# Patient Record
Sex: Female | Born: 1950 | Race: White | Hispanic: No | Marital: Married
Health system: Midwestern US, Community
[De-identification: ages and names within clinical notes are randomized; demographics above are authoritative.]

## PROBLEM LIST (undated history)

## (undated) VITALS — BP 122/60 | HR 60 | Temp 97.30000°F | Resp 16 | Wt 99.0 lb

## (undated) DIAGNOSIS — I4891 Unspecified atrial fibrillation: Secondary | ICD-10-CM

## (undated) DIAGNOSIS — I340 Nonrheumatic mitral (valve) insufficiency: Secondary | ICD-10-CM

## (undated) DIAGNOSIS — R300 Dysuria: Secondary | ICD-10-CM

## (undated) DIAGNOSIS — I428 Other cardiomyopathies: Principal | ICD-10-CM

## (undated) DIAGNOSIS — Z1239 Encounter for other screening for malignant neoplasm of breast: Secondary | ICD-10-CM

## (undated) DIAGNOSIS — I5020 Unspecified systolic (congestive) heart failure: Principal | ICD-10-CM

## (undated) DIAGNOSIS — R350 Frequency of micturition: Secondary | ICD-10-CM

## (undated) DIAGNOSIS — Z1231 Encounter for screening mammogram for malignant neoplasm of breast: Secondary | ICD-10-CM

## (undated) DIAGNOSIS — R922 Inconclusive mammogram: Secondary | ICD-10-CM

## (undated) DIAGNOSIS — N3281 Overactive bladder: Secondary | ICD-10-CM

## (undated) DIAGNOSIS — I5022 Chronic systolic (congestive) heart failure: Principal | ICD-10-CM

## (undated) DIAGNOSIS — Z139 Encounter for screening, unspecified: Secondary | ICD-10-CM

## (undated) DIAGNOSIS — R9230 Dense breasts, unspecified: Secondary | ICD-10-CM

## (undated) DIAGNOSIS — K922 Gastrointestinal hemorrhage, unspecified: Secondary | ICD-10-CM

## (undated) DIAGNOSIS — M150 Primary generalized (osteo)arthritis: Secondary | ICD-10-CM

## (undated) DIAGNOSIS — I480 Paroxysmal atrial fibrillation: Principal | ICD-10-CM

---

## 2014-02-13 ENCOUNTER — Encounter

## 2014-02-13 ENCOUNTER — Inpatient Hospital Stay: Admit: 2014-02-13 | Attending: Family Medicine | Primary: Family Medicine

## 2014-02-13 DIAGNOSIS — Z139 Encounter for screening, unspecified: Secondary | ICD-10-CM

## 2014-02-18 ENCOUNTER — Ambulatory Visit: Admit: 2014-02-18 | Primary: Family Medicine

## 2014-02-18 ENCOUNTER — Encounter

## 2014-02-18 ENCOUNTER — Inpatient Hospital Stay: Attending: Family Medicine | Primary: Family Medicine

## 2014-02-18 DIAGNOSIS — Z1231 Encounter for screening mammogram for malignant neoplasm of breast: Secondary | ICD-10-CM

## 2015-04-09 ENCOUNTER — Emergency Department: Admit: 2015-04-09 | Primary: Family Medicine

## 2015-04-09 ENCOUNTER — Inpatient Hospital Stay
Admission: EM | Admit: 2015-04-09 | Discharge: 2015-04-12 | Disposition: A | Discharge status: Home / Self Care | Admitting: Emergency Medicine

## 2015-04-09 DIAGNOSIS — I495 Sick sinus syndrome: Secondary | ICD-10-CM

## 2015-04-09 LAB — MAGNESIUM: Magnesium: 1.8 mg/dl (ref 1.6–2.4)

## 2015-04-09 LAB — T4, FREE: T4 Free: 0.89 ng/dl — ABNORMAL LOW (ref 0.93–1.76)

## 2015-04-09 LAB — BRAIN NATRIURETIC PEPTIDE: Pro-BNP: 3080 pg/ml — ABNORMAL HIGH (ref 0.0–900.0)

## 2015-04-09 LAB — TSH WITH REFLEX: TSH: 8.78 mcIU/ml — ABNORMAL HIGH (ref 0.400–4.20)

## 2015-04-09 MED ORDER — DILTIAZEM HCL 125 MG/25ML IV SOLN
125 MG/25ML | INTRAVENOUS | Status: DC
Start: 2015-04-09 — End: 2015-04-09

## 2015-04-09 MED ORDER — DILTIAZEM HCL 25 MG/5ML IV SOLN
25 MG/5ML | Freq: Once | INTRAVENOUS | Status: DC
Start: 2015-04-09 — End: 2015-04-09

## 2015-04-09 NOTE — H&P (Signed)
Patient seen and examined   Will adjust meds

## 2015-04-09 NOTE — ED Notes (Addendum)
Pt's heart monitor noted to show bradycardia. Pt awake in bed, patches verified. Pt denies symptoms. Dr. Aura Camps notified.      Danae Orleans, RN  04/09/15 1625       Danae Orleans, RN  04/09/15 [phone]

## 2015-04-09 NOTE — ED Notes (Addendum)
Pt ambulated back to bed with one assist. Pt tolerated well, reconnected to monitor. Call light in reach. Pt requesting something to drink, will check with physician. Family at bedside.      Danae Orleans, RN  04/09/15 1506       Danae Orleans, RN  04/09/15 [phone]

## 2015-04-09 NOTE — ED Notes (Signed)
Called Floor, Transporting pt via TELE Monitor to [address]  . [address]     [place], [place]  38/18/40 3754

## 2015-04-09 NOTE — Other (Signed)
Patient Acct Nbr:  [id number]  Primary AUTH/CERT:    Primary Insurance Company Name:   [employer] P[employer] H[employer] P  Primary Insurance Plan Name:  OUTPATIENT  Primary Insurance Group Number:  [id number]  Primary Insurance Plan Type:   Primary Insurance Policy Number:  [id number]    Secondary AUTH/CERT:    Secondary Insurance Company Name:   [employer] [facility] HMO P  Secondary Insurance Plan Name:  OUTPATIENT PRO  Secondary Insurance Group Number:  [id number]  Secondary Insurance Plan Type:   Secondary Insurance Policy Number:  [id number]

## 2015-04-09 NOTE — H&P (Signed)
[id number] h/p dictated

## 2015-04-09 NOTE — ED Notes (Signed)
Pt assisted up to bathroom, ambulated with one assist.      Danae Orleans, RN  04/09/15 1504

## 2015-04-09 NOTE — ED Provider Notes (Signed)
[facility]  eMERGENCY dEPARTMENT eNCOUnter          CHIEF COMPLAINT       Chief Complaint   Patient presents with   ??? Bradycardia   ??? Shortness of Breath       Nurses Notes reviewed and I agree except as noted in the HPI.    HISTORY OF PRESENT ILLNESS    Michelle Bright is a 65 y.o. female who presents to the Emergency Department from [facility] with concern of bradycardia. Patient woke up this morning with a severe frontal headache, dizziness, and shortness of breath. Patient felt very shaky as well. Patient went to [facility] for evaluation. Patient's heart rate was in the 30 to 40 range. Patient has a history of a-fib. She is on Coumadin. She also on metoprolol 25 mg TID. Labs from [facility] show a hemoglobin of 13.8, normal troponin, INR of 2.5, and a GFR of 59. Upon arrival here at the ED, patient denies chest pain, shortness of breath, headache, or dizziness.     The HPI was provided by the patient.        REVIEW OF SYSTEMS     Review of Systems   Constitutional: Negative for appetite change, chills, fatigue and fever.   HENT: Negative for congestion, ear pain, rhinorrhea and sore throat.    Eyes: Negative for pain, discharge and visual disturbance.   Respiratory: Positive for shortness of breath. Negative for cough and wheezing.    Cardiovascular: Negative for chest pain, palpitations and leg swelling.        See HPI for details.    Gastrointestinal: Negative for abdominal pain, diarrhea, nausea and vomiting.   Genitourinary: Negative for difficulty urinating, dysuria and vaginal discharge.   Musculoskeletal: Negative for arthralgias, back pain, joint swelling and neck pain.   Skin: Negative for pallor and rash.   Neurological: Positive for headaches. Negative for dizziness, syncope, weakness and light-headedness.   Hematological: Negative for adenopathy.   Psychiatric/Behavioral: Negative for confusion, dysphoric mood and suicidal ideas. The patient is not nervous/anxious.         PAST MEDICAL HISTORY    has a past medical history of Atrial fibrillation (HCC); Hypertension; Osteoporosis; and Thyroid disease.    SURGICAL HISTORY      has a past surgical history that includes Femur Surgery (12/2014); Thyroid surgery (2005); and fracture surgery.    CURRENT MEDICATIONS       Previous Medications    ASPIRIN 81 MG TABLET    Take 81 mg by mouth daily    CALCIUM CARB-CHOLECALCIFEROL (CALCIUM + D3 PO)    Take 1,200 mg by mouth 2 times daily    FLECAINIDE (TAMBOCOR) 100 MG TABLET    Take 50 mg by mouth 2 times daily    FUROSEMIDE (LASIX) 40 MG TABLET    Take 40 mg by mouth daily    IRON PO    Take 65 mg by mouth 2 times daily    LEVOTHYROXINE (SYNTHROID) 75 MCG TABLET    Take 75 mcg by mouth Daily    LISINOPRIL-HYDROCHLOROTHIAZIDE (PRINZIDE;ZESTORETIC) 20-12.5 MG PER TABLET    Take 1 tablet by mouth daily    METOPROLOL TARTRATE (LOPRESSOR) 50 MG TABLET    Take 25 mg by mouth 3 times daily    PRAVASTATIN (PRAVACHOL) 40 MG TABLET    Take 40 mg by mouth daily    WARFARIN (COUMADIN) 2 MG TABLET    Take 2 mg by  mouth daily Takes 4 mg on Sunday and Wednesday, all other days takes 2 mg.       ALLERGIES     has No Known Allergies.    FAMILY HISTORY     has no family status information on file.  family history is not on file.    SOCIAL HISTORY      reports that she has quit smoking. She does not have any smokeless tobacco history on file.    PHYSICAL EXAM     INITIAL VITALS:  height is 4\' 11"  (1.499 m) and weight is 128 lb (58.1 kg). Her oral temperature is 97.9 ??F (36.6 ??C). Her blood pressure is 130/84 and her pulse is 96. Her respiration is 20 and oxygen saturation is 95%.    Physical Exam   Constitutional: She is oriented to person, place, and time. She appears well-developed and well-nourished.   HENT:   Head: Normocephalic and atraumatic.   Right Ear: External ear normal.   Left Ear: External ear normal.   Eyes: Conjunctivae are normal. Right eye exhibits no discharge. Left eye exhibits no  discharge. No scleral icterus.   Neck: Normal range of motion. Neck supple. No JVD present.   Cardiovascular: Normal rate and normal heart sounds.  A regularly irregular rhythm present.   Pulmonary/Chest: Effort normal. No stridor. No respiratory distress.   Abdominal: Soft. She exhibits no distension.   Musculoskeletal: Normal range of motion. She exhibits no edema.   Neurological: She is alert and oriented to person, place, and time. She exhibits normal muscle tone. GCS eye subscore is 4. GCS verbal subscore is 5. GCS motor subscore is 6.   Skin: Skin is warm and dry. She is not diaphoretic. No erythema.   Psychiatric: She has a normal mood and affect. Her behavior is normal.   Nursing note and vitals reviewed.      DIFFERENTIAL DIAGNOSIS:   CHF, medication side effect, ACS, bronchitis, brady-tachy syndrome, electrolyte imbalance     DIAGNOSTIC RESULTS     EKG: All EKG's are interpreted by the Emergency Department Physician who either signs or Co-signs this chart in the absence of a cardiologist.    Vent. Rate: 75 bpm  PR interval: 326 ms  QRS duration: 124 ms  QTc: 477 ms  P-R-T axes: 90, 0, -12  Sinus rhythm with 1st degree AV block. Anteroseptal infarct, age undetermined. Abnormal ECG. No STEMI    RADIOLOGY: non-plain film images(s) such as CT, Ultrasound and MRI are read by the radiologist.    CT Head W/O contrast   Final Result   1. No evidence of an acute process.   2. Mild chronic small vessel                   **This report has been created using voice recognition software. It may contain minor errors which are inherent in voice recognition technology.**      Final report electronically signed by Dr. Earlyne Iba on 04/09/2015 3:42 PM             LABS:   Labs Reviewed   TSH WITH REFLEX - Abnormal; Notable for the following:        Result Value    TSH 8.780 (*)     All other components within normal limits   BRAIN NATRIURETIC PEPTIDE - Abnormal; Notable for the following:     Pro-BNP 3080.0 (*)     All other  components within normal limits   MAGNESIUM  T4, FREE       EMERGENCY DEPARTMENT COURSE:   Vitals:    Vitals:    04/09/15 1328 04/09/15 1454   BP: (!) 144/84 130/84   Pulse: 87 96   Resp: 18 20   Temp: 97.9 ??F (36.6 ??C)    TempSrc: Oral    SpO2: 99% 95%   Weight: 128 lb (58.1 kg)    Height: 4\' 11"  (1.499 m)      The patient was seen and evaluated. Medical record from outside hospital reviewed.    Patient was bradycardic in the beginning with heart rate around 40/min, but she was asymptomatic.    At time when I checked the patient, patient heart rate went to 120/min about 10-15 minutes with Afib RVR.    While I was about to start rate control, her heart rate spontaneously dropped to 30-40/minute again.    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    Given that patient is on Coumadin, INR 2.5 today, she complained headache, CT head was obtained negative for acute intracranial findings.    BNP 3080. TSH 8.8. Magnesium 1.8.    She had a chest x-ray done at outside hospital showing no acute findings.    Patient certainly needs a cardiology evaluation of her brady-tachycardia syndrome.    Discussed with Dr. Enrigue Catena and admitted to 8B.      CRITICAL CARE:   None     CONSULTS:  Dr. Enrigue Catena    PROCEDURES:  None    FINAL IMPRESSION      1. Brady-tachy syndrome (HCC)    2. Chronic atrial fibrillation (HCC)    3. Elevated brain natriuretic peptide (BNP) level    4. LONG TERM ANTICOAGULENT USE          DISPOSITION/PLAN   Admit    PATIENT REFERRED TO:  No follow-up provider specified.    DISCHARGE MEDICATIONS:  New Prescriptions    No medications on file       (Please note that portions of this note were completed with a voice recognition program.  Efforts were made to edit the dictations but occasionally words are mis-transcribed.)    Scribe:  Signed by: Marquette Saa, Scribe, 04/09/15 2:23 PM Scribing for and in the presence of Burna Cash, MD    Provider:  I personally performed the services described  in the documentation, reviewed and edited the documentation which was dictated to the scribe in my presence, and it accurately records my words and actions.    Burna Cash, MD 04/09/15 3:55 PM                     Burna Cash, MD  04/09/15 1556

## 2015-04-09 NOTE — ED Notes (Signed)
Pt requesting to sit up in a chair. Chair provided, pt states feels more comfortable. Pt and family informed of new room number for admission. Waiting on new room to be cleaned. Pt and family verbalized understanding.     Danae Orleans, RN  04/09/15 [phone]

## 2015-04-09 NOTE — ED Notes (Signed)
Pt presents via EMS from [facility] for bradycardia in the 40's. EMS reports prior to arrival pt converted to afib in the 90's. Pt denies symptoms. History of afib, pt on Coumadin.     Pt presents awake and alert. Denies pain. Reports she became short of breath around 0400-0600 today and went to be evaluate at [facility] to find out her heart rate was low. Pt reports shortness of breath is improved at this time. Family at bedside. Pt on 2L NC. Does not wear oxygen at home.      Danae Orleans, RN  04/09/15 [phone]

## 2015-04-10 LAB — COMPREHENSIVE METABOLIC PANEL
BUN: 46 mg/dl — ABNORMAL HIGH (ref 7–22)
CO2: 34 meq/l — ABNORMAL HIGH (ref 23–33)
Calcium: 9.5 mg/dl (ref 8.5–10.5)
Chloride: 94 meq/l — ABNORMAL LOW (ref 98–111)
Creatinine: 1.3 mg/dl — ABNORMAL HIGH (ref 0.4–1.2)
Glucose: 124 mg/dl — ABNORMAL HIGH (ref 70–108)
Potassium: 5 meq/l (ref 3.5–5.2)
Sodium: 143 meq/l (ref 135–145)

## 2015-04-10 LAB — PROTIME-INR: INR: 2.17 — ABNORMAL HIGH (ref 0.85–1.13)

## 2015-04-10 LAB — CBC
Hematocrit: 39.7 % (ref 37.0–47.0)
Hemoglobin: 13 gm/dl (ref 12.0–16.0)
MCH: 30.3 pg (ref 27.0–31.0)
MCHC: 32.6 gm/dl — ABNORMAL LOW (ref 33.0–37.0)
MCV: 92.9 fL (ref 81.0–99.0)
MPV: 8.8 mcm (ref 7.4–10.4)
Platelets: 192 10*3/uL (ref 130–400)
RBC: 4.28 10*6/uL (ref 4.20–5.40)
RDW: 16 % — ABNORMAL HIGH (ref 11.5–14.5)
WBC: 10 10*3/uL (ref 4.8–10.8)

## 2015-04-10 LAB — HEPATIC FUNCTION PANEL
ALT: 15 U/L (ref 11–66)
AST: 19 U/L (ref 5–40)
Albumin: 4.3 gm/dl (ref 3.5–5.1)
Alkaline Phosphatase: 95 U/L (ref 38–126)
Bilirubin, Direct: <0.2 mg/dl (ref 0.0–0.3)
Total Bilirubin: 0.5 mg/dl (ref 0.3–1.2)
Total Protein: 8 gm/dl (ref 6.1–8.0)

## 2015-04-10 LAB — ANION GAP: Anion Gap: 15 meq/l (ref 8.0–16.0)

## 2015-04-10 LAB — GLOMERULAR FILTRATION RATE, ESTIMATED: Est, Glom Filt Rate: 41 mL/min/{1.73_m2} — AB

## 2015-04-10 MED ORDER — ONDANSETRON HCL 4 MG/2ML IJ SOLN
4 MG/2ML | Freq: Four times a day (QID) | INTRAMUSCULAR | Status: DC | PRN
Start: 2015-04-10 — End: 2015-04-11

## 2015-04-10 MED ORDER — CALCIUM + D3 600-200 MG-UNIT PO TABS
600-200 MG-UNIT | Freq: Two times a day (BID) | ORAL | Status: DC
Start: 2015-04-10 — End: 2015-04-11
  Administered 2015-04-11 (×2): 1 via ORAL

## 2015-04-10 MED ORDER — FERROUS SULFATE 325 (65 FE) MG PO TABS
325 (65 Fe) MG | Freq: Two times a day (BID) | ORAL | Status: DC
Start: 2015-04-10 — End: 2015-04-10

## 2015-04-10 MED ORDER — ASPIRIN 81 MG PO CHEW
81 MG | Freq: Every day | ORAL | Status: DC
Start: 2015-04-10 — End: 2015-04-10

## 2015-04-10 MED ORDER — ACETAMINOPHEN 325 MG PO TABS
325 MG | ORAL | Status: DC | PRN
Start: 2015-04-10 — End: 2015-04-11
  Administered 2015-04-11: 01:00:00 650 mg via ORAL

## 2015-04-10 MED ORDER — CALCIUM + D3 600-200 MG-UNIT PO TABS
600-200 MG-UNIT | Freq: Two times a day (BID) | ORAL | Status: DC
Start: 2015-04-10 — End: 2015-04-10

## 2015-04-10 MED ORDER — METOPROLOL TARTRATE 25 MG PO TABS
25 MG | Freq: Three times a day (TID) | ORAL | Status: DC
Start: 2015-04-10 — End: 2015-04-10

## 2015-04-10 MED ORDER — FUROSEMIDE 40 MG PO TABS
40 MG | Freq: Every day | ORAL | Status: DC
Start: 2015-04-10 — End: 2015-04-10

## 2015-04-10 MED ORDER — PRAVASTATIN SODIUM 40 MG PO TABS
40 MG | Freq: Every evening | ORAL | Status: DC
Start: 2015-04-10 — End: 2015-04-10

## 2015-04-10 MED ORDER — FLECAINIDE ACETATE 50 MG PO TABS
50 MG | Freq: Two times a day (BID) | ORAL | Status: DC
Start: 2015-04-10 — End: 2015-04-11
  Administered 2015-04-11: 13:00:00 50 mg via ORAL

## 2015-04-10 MED ORDER — NORMAL SALINE FLUSH 0.9 % IV SOLN
0.9 % | INTRAVENOUS | Status: DC | PRN
Start: 2015-04-10 — End: 2015-04-11

## 2015-04-10 MED ORDER — NITROGLYCERIN 0.4 MG SL SUBL
0.4 MG | SUBLINGUAL | Status: DC | PRN
Start: 2015-04-10 — End: 2015-04-11

## 2015-04-10 MED ORDER — LEVOTHYROXINE SODIUM 75 MCG PO TABS
75 MCG | Freq: Every day | ORAL | Status: DC
Start: 2015-04-10 — End: 2015-04-10

## 2015-04-10 MED ORDER — ASPIRIN 81 MG PO TBEC
81 MG | Freq: Every day | ORAL | Status: DC
Start: 2015-04-10 — End: 2015-04-11
  Administered 2015-04-11: 13:00:00 81 mg via ORAL

## 2015-04-10 MED ORDER — LISINOPRIL-HYDROCHLOROTHIAZIDE 20-12.5 MG PO TABS
Freq: Every day | ORAL | Status: DC
Start: 2015-04-10 — End: 2015-04-11
  Administered 2015-04-10 – 2015-04-11 (×2): 1 via ORAL

## 2015-04-10 MED ORDER — FUROSEMIDE 40 MG PO TABS
40 MG | Freq: Every day | ORAL | Status: DC
Start: 2015-04-10 — End: 2015-04-11
  Administered 2015-04-11: 13:00:00 40 mg via ORAL

## 2015-04-10 MED ORDER — HYDROCHLOROTHIAZIDE 25 MG PO TABS
25 MG | Freq: Every day | ORAL | Status: DC
Start: 2015-04-10 — End: 2015-04-10

## 2015-04-10 MED ORDER — PRAVASTATIN SODIUM 40 MG PO TABS
40 MG | Freq: Every evening | ORAL | Status: DC
Start: 2015-04-10 — End: 2015-04-11
  Administered 2015-04-11: 40 mg via ORAL

## 2015-04-10 MED ORDER — POTASSIUM CHLORIDE 10 MEQ/100ML IV SOLN
10 MEQ/0ML | INTRAVENOUS | Status: DC | PRN
Start: 2015-04-10 — End: 2015-04-11

## 2015-04-10 MED ORDER — FLECAINIDE ACETATE 50 MG PO TABS
50 MG | Freq: Two times a day (BID) | ORAL | Status: DC
Start: 2015-04-10 — End: 2015-04-10

## 2015-04-10 MED ORDER — POTASSIUM CHLORIDE 20 MEQ/15ML (10%) PO SOLN
20 MEQ/15ML (10%) | ORAL | Status: DC | PRN
Start: 2015-04-10 — End: 2015-04-11

## 2015-04-10 MED ORDER — NORMAL SALINE FLUSH 0.9 % IV SOLN
0.9 % | Freq: Two times a day (BID) | INTRAVENOUS | Status: DC
Start: 2015-04-10 — End: 2015-04-11

## 2015-04-10 MED ORDER — POTASSIUM CHLORIDE CRYS ER 20 MEQ PO TBCR
20 MEQ | ORAL | Status: DC | PRN
Start: 2015-04-10 — End: 2015-04-11

## 2015-04-10 MED ORDER — LISINOPRIL 20 MG PO TABS
20 MG | Freq: Every day | ORAL | Status: DC
Start: 2015-04-10 — End: 2015-04-10

## 2015-04-10 MED ORDER — WARFARIN SODIUM 2 MG PO TABS
2 MG | Freq: Every day | ORAL | Status: DC
Start: 2015-04-10 — End: 2015-04-11
  Administered 2015-04-11: 13:00:00 2 mg via ORAL

## 2015-04-10 MED ORDER — ASPIRIN 81 MG PO TBEC
81 MG | Freq: Every day | ORAL | Status: DC
Start: 2015-04-10 — End: 2015-04-10

## 2015-04-10 MED ORDER — WARFARIN DAILY DOSING BY PHARMACIST (PLACEHOLDER)
Status: DC
Start: 2015-04-10 — End: 2015-04-11

## 2015-04-10 MED ORDER — CALCIUM CARBONATE-VITAMIN D 500-200 MG-UNIT PO TABS
500-200 MG-UNIT | Freq: Two times a day (BID) | ORAL | Status: DC
Start: 2015-04-10 — End: 2015-04-10

## 2015-04-10 MED ORDER — MAGNESIUM HYDROXIDE 400 MG/5ML PO SUSP
400 MG/5ML | Freq: Every day | ORAL | Status: DC | PRN
Start: 2015-04-10 — End: 2015-04-11

## 2015-04-10 MED ORDER — FERROUS SULFATE 325 (65 FE) MG PO TABS
325 (65 Fe) MG | Freq: Two times a day (BID) | ORAL | Status: DC
Start: 2015-04-10 — End: 2015-04-11
  Administered 2015-04-10 – 2015-04-11 (×2): 325 mg via ORAL

## 2015-04-10 MED ORDER — LISINOPRIL-HYDROCHLOROTHIAZIDE 20-12.5 MG PO TABS
Freq: Every day | ORAL | Status: DC
Start: 2015-04-10 — End: 2015-04-10

## 2015-04-10 MED ORDER — SODIUM CHLORIDE 0.9 % IV SOLN
0.9 % | INTRAVENOUS | Status: DC
Start: 2015-04-10 — End: 2015-04-11
  Administered 2015-04-10: 20:00:00 via INTRAVENOUS

## 2015-04-10 MED FILL — FERROUS SULFATE 325 (65 FE) MG PO TABS: 325 (65 Fe) MG | ORAL | Qty: 1

## 2015-04-10 MED FILL — HYDROCHLOROTHIAZIDE 25 MG PO TABS: 25 MG | ORAL | Qty: 1

## 2015-04-10 MED FILL — PRAVASTATIN SODIUM 40 MG PO TABS: 40 MG | ORAL | Qty: 1

## 2015-04-10 MED FILL — ASPIRIN EC LOW DOSE 81 MG PO TBEC: 81 MG | ORAL | Qty: 1

## 2015-04-10 MED FILL — LEVOTHROID 75 MCG PO TABS: 75 MCG | ORAL | Qty: 1

## 2015-04-10 MED FILL — FLECAINIDE ACETATE 50 MG PO TABS: 50 MG | ORAL | Qty: 1

## 2015-04-10 MED FILL — FUROSEMIDE 40 MG PO TABS: 40 MG | ORAL | Qty: 1

## 2015-04-10 MED FILL — OYSTER SHELL CALCIUM/D 500-200 MG-UNIT PO TABS: 500-200 MG-UNIT | ORAL | Qty: 1

## 2015-04-10 MED FILL — METOPROLOL TARTRATE 25 MG PO TABS: 25 MG | ORAL | Qty: 1

## 2015-04-10 MED FILL — LISINOPRIL 20 MG PO TABS: 20 MG | ORAL | Qty: 1

## 2015-04-10 NOTE — Progress Notes (Signed)
Admit Date: 04/09/2015  Hospital day 1    Subjective:     Patient has no complaint of chest pain .Marland Kitchen   Medication side effects: none    Scheduled Meds:  ??? warfarin  2 mg Oral Daily   ??? sodium chloride flush  10 mL Intravenous 2 times per day   ??? warfarin (COUMADIN) daily dosing Leisure centre manager)   Other RX Placeholder   ??? lisinopril-hydrochlorothiazide  1 tablet Oral Daily   ??? [START ON 04/11/2015] aspirin  81 mg Oral Daily   ??? metoprolol tartrate  25 mg Oral TID   ??? [START ON 04/11/2015] furosemide  40 mg Oral Daily   ??? [START ON 04/11/2015] flecainide  50 mg Oral BID   ??? pravastatin  40 mg Oral Nightly   ??? Calcium + D3  1 tablet Oral BID   ??? ferrous sulfate  325 mg Oral BID WC   ??? [START ON 04/11/2015] levothyroxine  100 mcg Oral Daily     Continuous Infusions:  ??? sodium chloride 20 mL/hr at 04/10/15 1558     PRN Meds:sodium chloride flush, potassium chloride **OR** potassium chloride **OR** potassium chloride, acetaminophen, magnesium hydroxide, ondansetron, nitroGLYCERIN    Review of Systems  Pertinent items are noted in HPI.    Objective:     Patient Vitals for the past 8 hrs:   BP Temp Temp src Pulse Resp SpO2   04/10/15 1957 115/70 98.1 ??F (36.7 ??C) Oral 54 16 94 %   04/10/15 1606 (!) 119/56 98.3 ??F (36.8 ??C) Oral 56 16 95 %     I/O last 3 completed shifts:  In: 400 [P.O.:400]  Out: 400 [Urine:400]    Visit Vitals   ??? BP 111/75   ??? Pulse 70   ??? Temp 98.2 ??F (36.8 ??C) (Oral)   ??? Resp 16   ??? Ht 4\' 11"  (1.499 m)   ??? Wt 124 lb 11.2 oz (56.6 kg)   ??? SpO2 95%   ??? BMI 25.19 kg/m2       General Appearance:    Alert, cooperative, no distress, appears stated age   Head:    Normocephalic, without obvious abnormality, atraumatic   Eyes:    PERRL, conjunctiva/corneas clear, EOM's intact, fundi     benign, both eyes   Ears:    Normal TM's and external ear canals, both ears   Nose:   Nares normal, septum midline, mucosa normal, no drainage    or sinus tenderness   Throat:   Lips, mucosa, and tongue normal; teeth and gums normal    Neck:   Supple, symmetrical, trachea midline, no adenopathy;     thyroid:  no enlargement/tenderness/nodules; no carotid    bruit or JVD   Back:     Symmetric, no curvature, ROM normal, no CVA tenderness   Lungs:     Clear to auscultation bilaterally, respirations unlabored   Chest Wall:    No tenderness or deformity    Heart:    Regular rate and rhythm, S1 and S2 normal, no murmur, rub   or gallop   Breast Exam:    No tenderness, masses, or nipple abnormality   Abdomen:     Soft, non-tender, bowel sounds active all four quadrants,     no masses, no organomegaly   Genitalia:    Normal female without lesion, discharge or tenderness   Rectal:    Normal tone ;guaiac negative stool   Extremities:   Extremities normal, atraumatic, no cyanosis or edema  Pulses:   2+ and symmetric all extremities   Skin:   Skin color, texture, turgor normal, no rashes or lesions   Lymph nodes:   Cervical, supraclavicular, and axillary nodes normal   Neurologic:   CNII-XII intact, normal strength, sensation and reflexes     throughout       ECG: normal sinus rhythm, no blocks or conduction defects, no ischemic changes.   Data Review  CBC:   Lab Results   Component Value Date    WBC 8.7 04/11/2015    RBC 4.24 04/11/2015     BMP:   Lab Results   Component Value Date    GLUCOSE 105 04/11/2015    CO2 29 04/11/2015    BUN 38 04/11/2015    CREATININE 1.0 04/11/2015    CALCIUM 9.4 04/11/2015     Coagulation:   Lab Results   Component Value Date    INR 2.17 04/10/2015     Cardiac markers: No results found for: CKMB, TROPONINT, MYOGLOBIN  ABG: No results found for: HCO3ART, BEART, O2SATART, PHART, THGBART, PCO2ART, PO2ART, TCO2ART    Assessment:     Active Problems:    * No active hospital problems. *      Plan:   Patient in sr    no chest pain    she dose not want any invasive procedure at this point    ambulate stop lopressor

## 2015-04-10 NOTE — Progress Notes (Signed)
Copy of healthcare power of attorney and living will information placed in patient's chart.

## 2015-04-10 NOTE — Progress Notes (Signed)
Pharmacy Medication History Note      List of current medications patient is taking is complete.    Source of information: patient's family    Changes made to medication list:  Medications flagged for removal (include reason, ex. noncompliance):  ?? None    Medications removed (include reason, ex. therapy complete or physician discontinued):  ?? None    Medications added/doses adjusted:  ?? Added Tylenolol 650 mg PO Q6H prn pain      Other notes (ex. Recent course of antibiotics, Coumadin dosing):  ?? Takes Coumadin and follows up with Dr. Geradine Girt, a cardiologist in [place], [place]  ?? Denies use of other OTC or herbal medications.      Allergies reviewed      Electronically signed by Granville Lewis, RPH on 04/10/2015 at 3:22 PM

## 2015-04-10 NOTE — Other (Signed)
2100: Dr. Enrigue Catena was on the floor and made aware that the patient had not received Her Metoprolol 25 mg throughout the day or her Fleconide during day shift because pt was bradycardic. It was also discussed to place patient on a cardiac diet.    2107: Dr. Enrigue Catena wanted to monitor the pt HR on the monitor and instructed the nurse to walk the pt around the unit. THe pt HR remained stable and pt tolerated walking 200 Ft well, no chest pain or shortness of breath. Orders were received to discontinue 25 mg Lopressor TID and monitor throughout the night.     2110: THe nurse notified Dr. Enrigue Catena about pt INR of 2.17 for coumadin dosing.

## 2015-04-10 NOTE — Plan of Care (Signed)
Problem: Falls - Risk of  Goal: Absence of falls  Outcome: Ongoing  Fall precautions in place. Bed locked and in lowest position. Bedside table and call light within reach.     Problem: Cardiovascular  Goal: No DVT, peripheral vascular complications  Outcome: Ongoing  Coumadin for DVT prophylaxis. No signs/symptoms DVT.   Goal: Hemodynamic stability  Outcome: Ongoing  Patient having bradycardia in 40's and 50's. Other vital signs stable. Will continue to assess.   Goal: Anticoagulate/Hct stable  Outcome: Ongoing  Coumadin for anticoagulation. Will continue to assess.     Problem: Respiratory  Goal: No pulmonary complications  Outcome: Ongoing  SpO2 greater than 90% on room air.   Goal: O2 Sat > 90%  Outcome: Ongoing  SpO2 greater than 90% on room air.     Problem: Musculor/Skeletal Functional Status  Goal: Highest potential functional level  Outcome: Ongoing  Assistance needed with ADL's. Will continue to assess.   Goal: Absence of falls  Outcome: Ongoing  Fall precautions in place. Bed locked and in lowest position. Bedside table and call light within reach.     Comments:   Care plan reviewed with patient and family.  Patient and family verbalize understanding of the plan of care and contribute to goal setting.

## 2015-04-10 NOTE — Care Coordination-Inpatient (Signed)
Erlinda Hong       Admitted from: ED 04/09/2015/ 1547 Hospital day: 0   Location: 3B-38/038-A Reason for admit: BRADY TACHY SYNDROME Status: obs  Admit order signed?: no  PMH:  has a past medical history of Arthritis; Atrial fibrillation (HCC); CAD (coronary artery disease); Hypertension; Osteoporosis; and Thyroid disease.  Medications:  Scheduled Meds:  Continuous Infusions:   Pertinent Info/Orders/Treatment Plan: Here for bradycardia, heart rate 40-50s. CT head shows no acute process. Awaiting for physician to round for further orders.   Diet: DIET CARDIAC;   Vital Signs:   Visit Vitals   ??? BP (!) 120/54   ??? Pulse 53   ??? Temp 97.7 ??F (36.5 ??C) (Oral)   ??? Resp 18   ??? Ht 4\' 11"  (1.499 m)   ??? Wt 123 lb 12.8 oz (56.2 kg)   ??? SpO2 97%   ??? BMI 25 kg/m2     DVT Prophylaxis: not on  Influenza Vaccination Screening Completed: yes  Pneumonia Vaccination Screening Completed: yes  Core measures: monitoring  PCP: Padmaja Chalasani  Readmission: no  Risk Score: 3.5     Discharge Planning  Current Residence:  Private Residence  Living Arrangements:  Spouse/Significant Other   Support Systems:  Spouse/Significant Other, Children, Family Members  Current Services PTA:     Potential Assistance Needed:  N/A  Potential Assistance Purchasing Medications:  No  Does patient want to participate in local refill/ meds to beds program?  No  Type of Home Care Services:  None  Patient expects to be discharged to:  home  Expected Discharge date:  04/12/15  Follow Up Appointment: Best Day/ Time: Tuesday PM    Discharge Plan: Pt from home with husband.   Needs help with meds? no  Needs transportation? no  Social Services Evaluation: not at this time.

## 2015-04-10 NOTE — Other (Signed)
During my contact with the patient, I gave them a copy of the Adv Dir. They will complete the documents after reviewed. Plan: Continued support would be helpful.      04/10/15 1108   Encounter Summary   Services provided to: Patient and family together   Referral/Consult From: Rounding   Continue Visiting Yes  (04/10/15 Spiritual support would be helpful. )   Complexity of Encounter Low   Length of Encounter 15 minutes   Routine   Type Initial   Assessment Approachable   Intervention Nurtured hope   Outcome Expressed gratitude   Spiritual/Religious   Type Spiritual support   Advance Directives (For Healthcare)   Healthcare Directive No, patient does not have an advance directive for healthcare treatment   Advance Directives Documents given;Documents explained

## 2015-04-10 NOTE — Plan of Care (Signed)
Problem: Falls - Risk of  Goal: Absence of falls  Outcome: Ongoing  Assessment & interventions provided throughout shift.  Bed locked & in low position, call light in reach, side-rails up x2, non-slip socks on when ambulating, reminded patient to use call light to call for assistance.        Comments:   Care plan reviewed with patient.  Patient verbalizes understanding of the care plan and contributed to goal setting.

## 2015-04-11 LAB — EKG 12-LEAD
Atrial Rate: 59 {beats}/min
Atrial Rate: 75 {beats}/min
P Axis: 90 degrees
P Axis: 93 degrees
P-R Interval: 230 ms
P-R Interval: 326 ms
Q-T Interval: 428 ms
Q-T Interval: 474 ms
QRS Duration: 124 ms
QRS Duration: 98 ms
QTc Calculation (Bazett): 469 ms
QTc Calculation (Bazett): 477 ms
R Axis: 0 degrees
R Axis: 16 degrees
T Axis: -12 degrees
T Axis: 44 degrees
Ventricular Rate: 59 {beats}/min
Ventricular Rate: 75 {beats}/min

## 2015-04-11 LAB — CBC
Hematocrit: 39.7 % (ref 37.0–47.0)
Hemoglobin: 13.4 gm/dl (ref 12.0–16.0)
MCH: 31.5 pg — ABNORMAL HIGH (ref 27.0–31.0)
MCHC: 33.7 gm/dl (ref 33.0–37.0)
MCV: 93.5 fL (ref 81.0–99.0)
MPV: 9.2 mcm (ref 7.4–10.4)
Platelets: 159 10*3/uL (ref 130–400)
RBC: 4.24 10*6/uL (ref 4.20–5.40)
RDW: 15.2 % — ABNORMAL HIGH (ref 11.5–14.5)
WBC: 8.7 10*3/uL (ref 4.8–10.8)

## 2015-04-11 LAB — COMPREHENSIVE METABOLIC PANEL
ALT: 12 U/L (ref 11–66)
AST: 17 U/L (ref 5–40)
Albumin: 3.9 gm/dl (ref 3.5–5.1)
Alkaline Phosphatase: 85 U/L (ref 38–126)
BUN: 38 mg/dl — ABNORMAL HIGH (ref 7–22)
CO2: 29 meq/l (ref 23–33)
Calcium: 9.4 mg/dl (ref 8.5–10.5)
Chloride: 98 meq/l (ref 98–111)
Creatinine: 1 mg/dl (ref 0.4–1.2)
Glucose: 105 mg/dl (ref 70–108)
Potassium: 3.8 meq/l (ref 3.5–5.2)
Sodium: 142 meq/l (ref 135–145)
Total Bilirubin: 0.6 mg/dl (ref 0.3–1.2)
Total Protein: 7.2 gm/dl (ref 6.1–8.0)

## 2015-04-11 LAB — LIPID PANEL
Cholesterol, Total: 156 mg/dl (ref 100–199)
HDL: 64 mg/dl
LDL Calculated: 71 mg/dl
Triglycerides: 107 mg/dl (ref 0–199)

## 2015-04-11 LAB — GLOMERULAR FILTRATION RATE, ESTIMATED: Est, Glom Filt Rate: 55 mL/min/{1.73_m2} — AB

## 2015-04-11 LAB — ANION GAP: Anion Gap: 15 meq/l (ref 8.0–16.0)

## 2015-04-11 MED ORDER — LEVOTHYROXINE SODIUM 100 MCG PO TABS
100 MCG | Freq: Every day | ORAL | Status: DC
Start: 2015-04-11 — End: 2015-04-11
  Administered 2015-04-11: 13:00:00 100 ug via ORAL

## 2015-04-11 MED ORDER — NITROGLYCERIN 0.4 MG SL SUBL
0.4 MG | ORAL_TABLET | SUBLINGUAL | 3 refills | Status: AC
Start: 2015-04-11 — End: ?

## 2015-04-11 MED FILL — ACETAMINOPHEN 325 MG PO TABS: 325 mg | ORAL | Qty: 2

## 2015-04-11 MED FILL — LEVOTHROID 100 MCG PO TABS: 100 MCG | ORAL | Qty: 1

## 2015-04-11 NOTE — Discharge Instructions (Signed)
Good nutrition is important when healing from an illness, injury, or surgery. Follow any nutrition recommendations given to you during the hospital stay. If you are instructed to take an oral nutrition supplement at home, you can take it with meals, in-between meals, and/or before bedtime. These supplements can be purchased at most local grocery stores, pharmacies, and chain super-stores. If you need help in covering the cost of your medication or oral supplement, visit www.RxAssist.org. If you have any questions about your diet or nutrition, call the hospital and ask for the dietitian.       Your nutrition orders at the time of discharge were:  DIET CARDIAC;.

## 2015-04-11 NOTE — Procedures (Signed)
EKG was handed to Sean RN.

## 2015-04-11 NOTE — H&P (Signed)
[facility]                       [address] [place]  [address]                                 HISTORY AND PHYSICAL    PATIENT NAME:  Michelle Bright, Michelle A.                DOB:      [birth year]  MED REC NO:    [id number]                        ROOM:     3B 0038A  ACCOUNT NO:    [id number]                          ADMISSION DATE: 04/09/2015  PHYSICIAN:     Alver Fisher, M.D.                  This is in lieu of the Epic computer note.    REASON FOR ADMISSION:  Bradycardia.    HISTORY OF PRESENT ILLNESS:  This is a 65 year old lady who was transferred  from her local community hospital.  Apparently, patient had a history of  atrial fibrillation.  She was in the Emergency Room with her local community  hospital with palpitations and atrial fibrillation, converted to 120.   Apparently, she had also the Emergency Room bradycardia.  She was admitted  because of that.  She was very anxious in the Emergency Room at [facility] and she was given Xanax.  She felt better.  The family requested to  be transferred here.  The patient then came to the Emergency Room to our  facility.  She was in atrial flutter with 2:1 blockage.  The patient, however,  converted spontaneously to a sinus rhythm.  The patient has no prior history  of MI.  She is telling me that she had a stress test and echo performed by Dr.  Geradine Girt before long and she was told everything was okay.  The patient has no  prior history of MI.  The patient gets dyspnea on exertion.  The patient has  no history of syncopal episodes.    REVIEW OF SYSTEMS:  The patient has no history of CVA or TIA.  She had a  history of hypothyroidism on hormone replacement.  Medications recently  adjusted.  The patient has a history of hypertension.  She had a history of  osteoporosis, history of arthritis.    She has no fevers, chills, or rigors.  She denied claudication.  She had a  history of femur surgery in the past.  The  patient has no change in her  weight.  No psychological disorders.  She has no hematological disorders.  The  patient when admitted, her BUN and creatinine were elevated.    ALLERGIES:  The patient has no known allergy.    SOCIAL HISTORY:  Denies smoking or alcohol abuse.    FAMILY HISTORY:  Irrelevant.  The patient is a former smoker.    PHYSICAL EXAMINATION:  VITAL SIGNS:  Showed her blood pressure is 110/60.  She was in sinus  bradycardia.  She was afebrile.  Respiratory rate was 18.  NEUROLOGIC:  The patient has  no focal neurological deficits.  NECK:  She has no JVD.  She has no carotid bruit.  There was no thyromegaly,  no lymphadenopathy.  CHEST:  There increase at the AP diameter of the chest.    HEART:  Distant heart sounds.  II/VI systolic murmur.  ABDOMEN:  Soft.  GENITAL AND RECTAL EXAM:  Deferred.  EXTREMITIES:  There was no edema.    Her EKG showed atrial flutter.    IMPRESSION:  1.  The patient in atrial flutter.  The patient placed on Cardizem drip.  The  patient converted spontaneously to sinus rhythm.  2.  Possible chronic obstructive pulmonary disease, increase in the AP  diameter of the chest.  3.  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  4.  Anxiety.  5.  Hypothyroidism, on hormone replacement, recently was adjusted.  6.  Hypercholesterolemia, on a statin.  7.  History of thyroid surgery.  8.  History of femur surgery.    The patient admitted to the Telemetry bed.  Obtain serial cardiac enzymes,  serial EKGs.  Continue home medication and further recommendations pending the  results of the above tests.        Alver Fisher, M.D.      D: 04/13/2015 06:34:13  T: 04/13/2015 07:02:52  AS/nts  Job#: [id number]  Doc#: [id number]    cc:  Alver Fisher, M.D.

## 2015-04-11 NOTE — Discharge Summary (Signed)
[facility]                       [address] [place]  [address]                                   DISCHARGE SUMMARY    PATIENT NAME:  Michelle Bright, Michelle A.                DOB:      Apr 06, 1950  MED REC NO:    [id number]                        ROOM:     3B 0038A  ACCOUNT NO:    [id number]                          ADMISSION DATE: 04/09/2015  PHYSICIAN:     Alver Fisher, M.D.               DISCHARGE DATE: 04/11/2015      FINAL DIAGNOSES:  1.  Bradycardia-tachycardia syndrome.  2.  History of hypertension.  3.  History of hypothyroidism, on hormone replacement.  4.  Possible chronic obstructive pulmonary disease.   5.  Ex-smoker.    BRIEF HISTORY:  A 65 year old lady was admitted to the hospital through the  Emergency Room from her local community hospital with palpitation and  shortness of breath.  The patient was given Xanax.  The patient came here for  further evaluation.  Apparently, the patient has a history of atrial fib in  the past.  She has been on the Coumadin.  She has been on flecainide and  Lopressor.  The patient has seen Dr. Geradine Girt in the past, she had noninvasive  cardiac workup by him and she was told everything was okay.  The patient came  in with shortness of breath.    HOSPITALIZATION COURSE:  The patient was admitted to the hospital.  She was  placed on Cardizem drip.  She was in atrial fib with 2:1.  They showed the  patient converted to a sinus rhythm.  The patient was in sinus bradycardia.   Beta blockers were discontinued.  We kept her flecainide on for atrial fib.    She continued to be on for sinus rhythm.  The patient does not want to have  any invasive procedure done.  She indicates she is feeling better and she  wants to go home.  The patient indicates she has seen Dr. Geradine Girt and she will  continue seeing him.  The patient was advised to seek medical attention if she  had any recurrence of her chest pain or dizziness or syncopal episode.  At  one  point she might need to have a pacemaker put in pending her clinical course,  but since she has been in the hospital in the last 24 hours she continued to  be in sinus rhythm with no pauses.  No pericardial symptoms.  The patient  ambulated and she was able to increase her heart rate to an 80 and she was not  short of breath with that.  The patient discharged home to follow up with Dr.  Geradine Girt in a week.  To follow with family physician and seek medical attention  if she has any change in her  clinical condition.    The patient's BNP was slightly elevated when she came in and her TSH was  elevated.  The patient indicates recently her thyroid medication was adjusted.   She is to follow with family physician to monitor his renal function.  The  patient advised about diet and exercise.        Alver Fisher, M.D.      D: 04/13/2015 06:39:31  T: 04/13/2015 07:13:59  AS/nts  Job#: [id number]  Doc#: [id number]

## 2015-04-11 NOTE — Discharge Summary (Signed)
[id number] discharge summary dictated

## 2015-08-24 ENCOUNTER — Encounter

## 2015-08-24 ENCOUNTER — Inpatient Hospital Stay: Admit: 2015-08-24 | Payer: BLUE CROSS/BLUE SHIELD | Primary: Family Medicine

## 2015-08-24 DIAGNOSIS — Z1231 Encounter for screening mammogram for malignant neoplasm of breast: Secondary | ICD-10-CM

## 2015-09-02 IMAGING — MG MAMMO SCRN BIL W/CAD TOMO
8 series · 9 of 24 positions shown · non-contrast
Comparison: none

Images Obtained from [facility]
CLINICAL RA REF: Mammo Scrn bilateral (Digital) W/Cad Routine with Tomosynthesis.
Digital images were generated from the 3D Tomosynthesis data acquired during the exam.
Comparison is made to exam dated:  10/25/2006 [HOSPITAL] - [HOSPITAL].
The tissue of both breasts is predominantly fatty.
Current study was also evaluated with a Computer Aided Detection (CAD) system.
No significant masses, calcifications, or other findings are seen in either breast.
There has been no significant interval change.

[R CC]
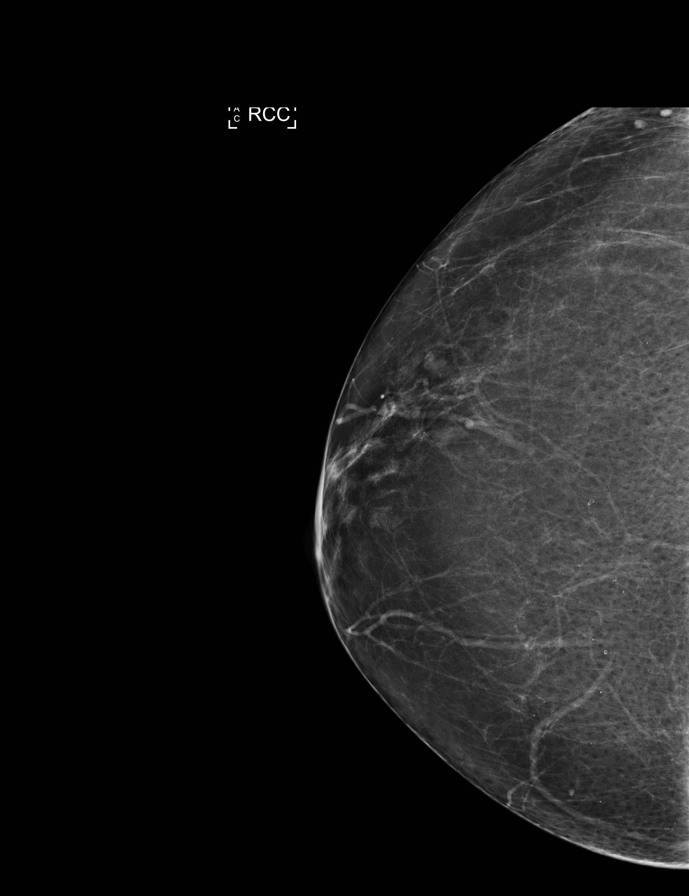

[L CC]
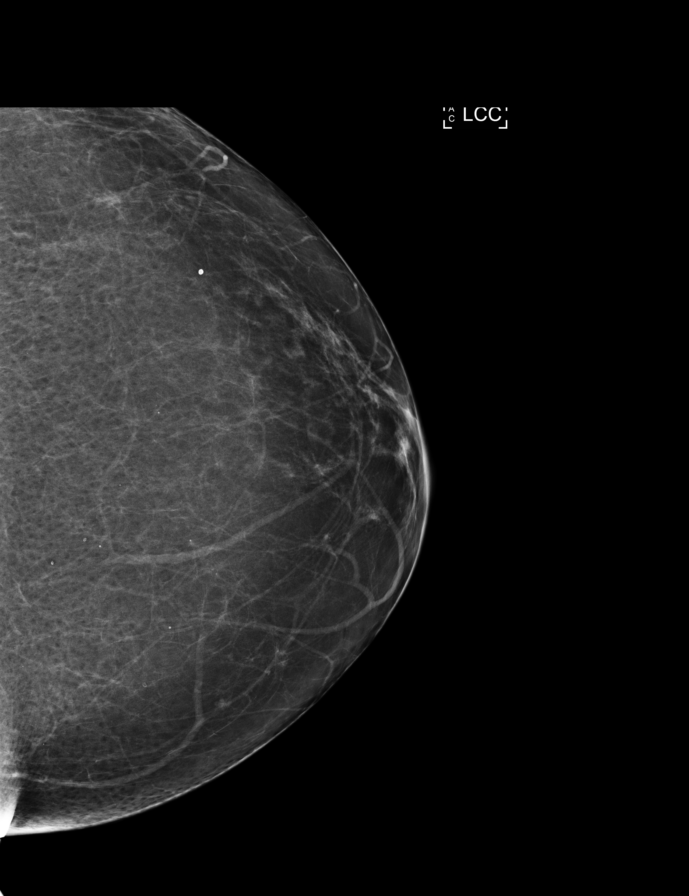

[L MLO]
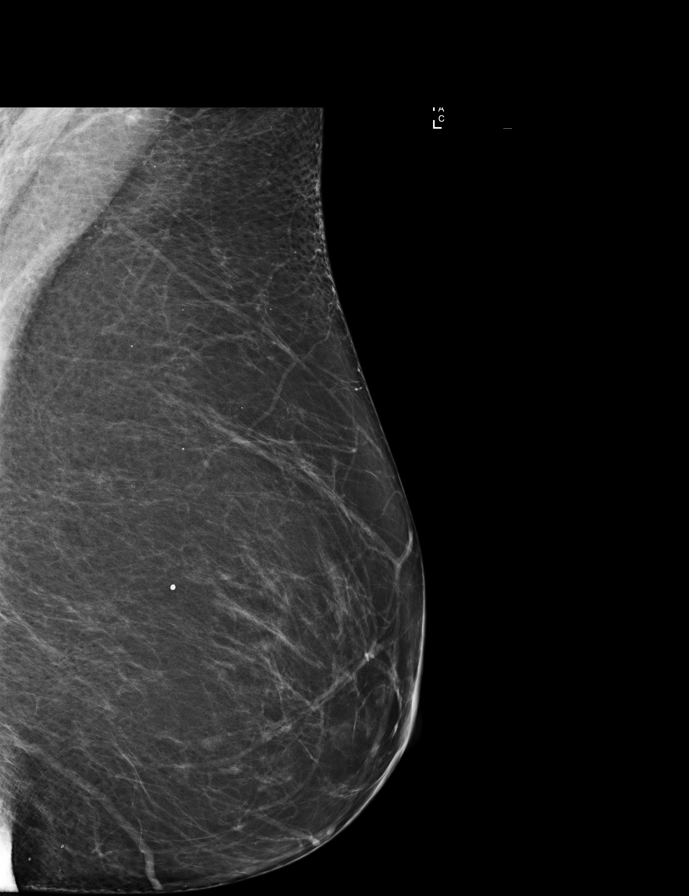

[R MLO]
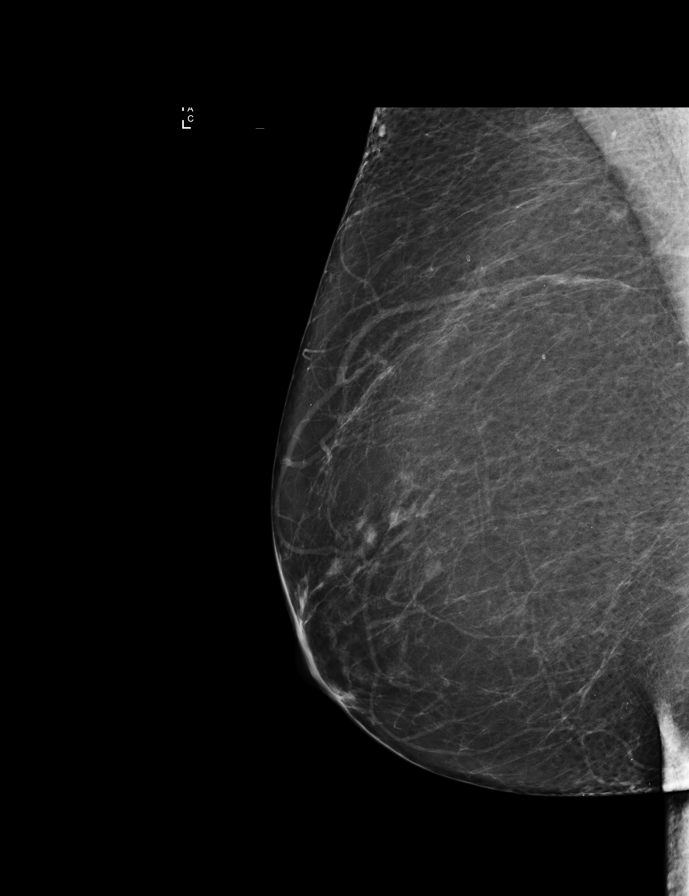

[L MLO tomo · 2 of 91 frames shown]
[frame 30/91]
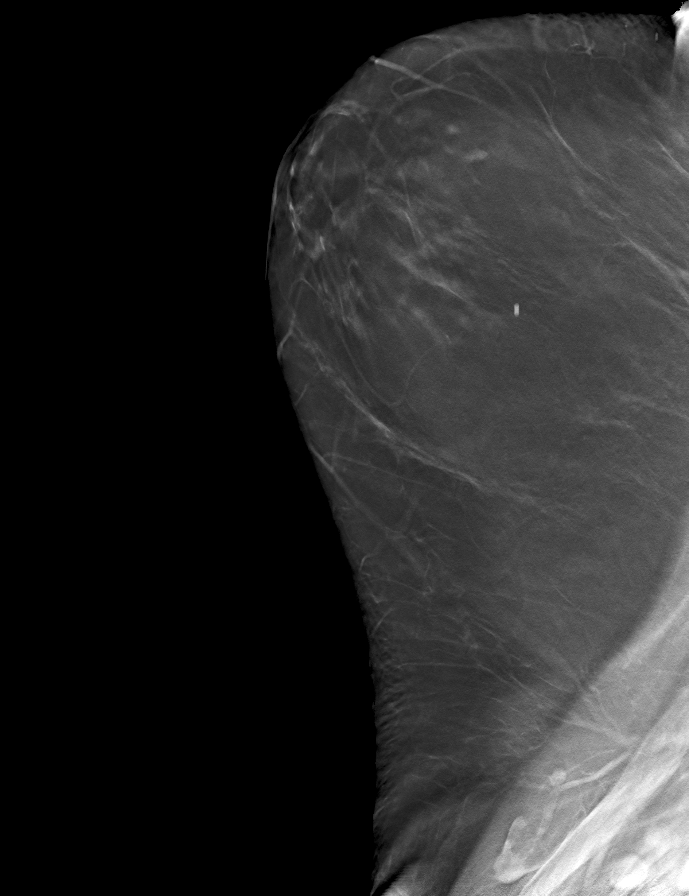
[frame 46/91]
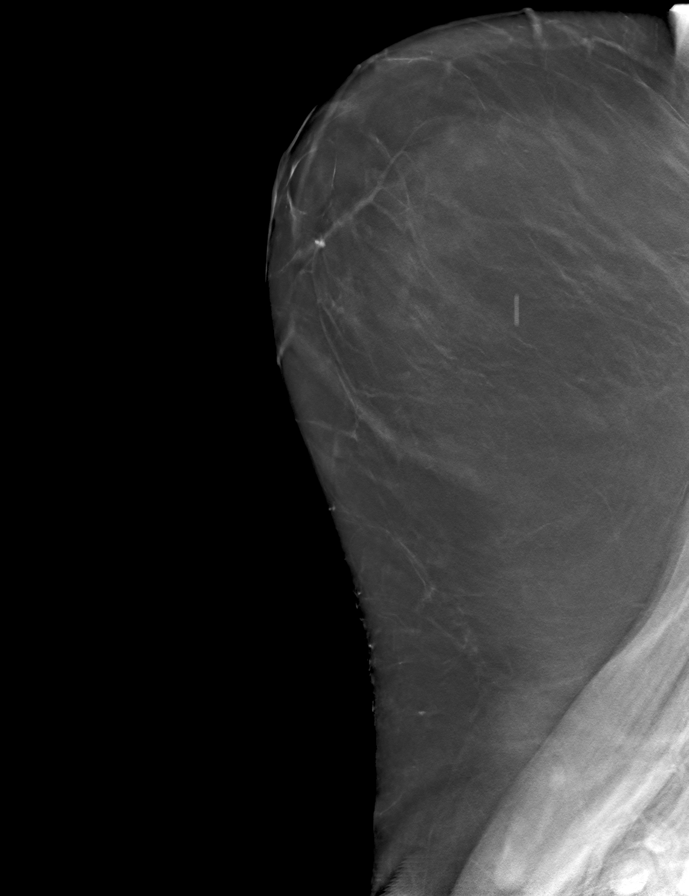

[R MLO tomo · tomo slice 47/93.0]
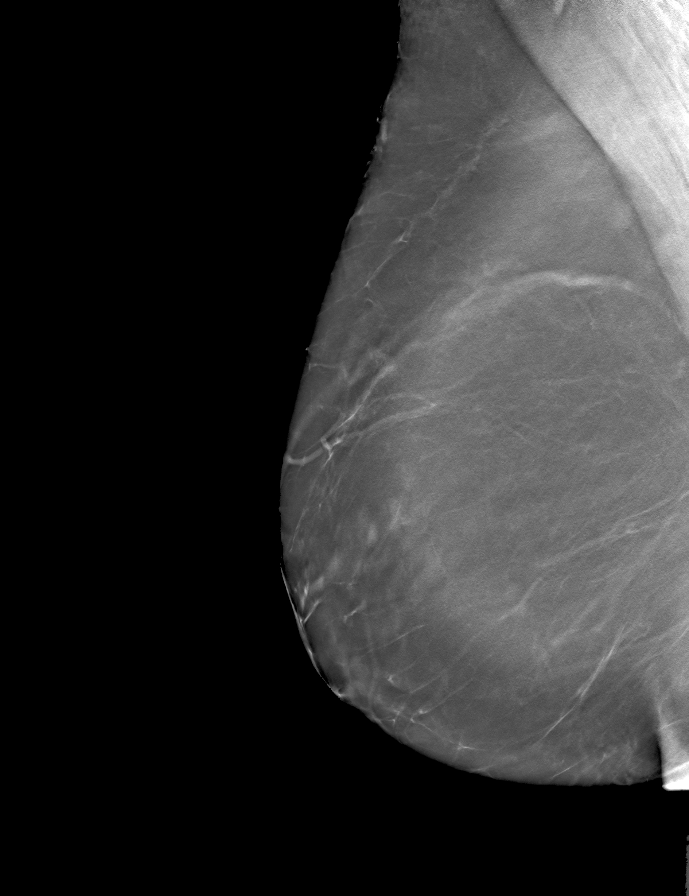

[L CC tomo · tomo slice 43/85.0]
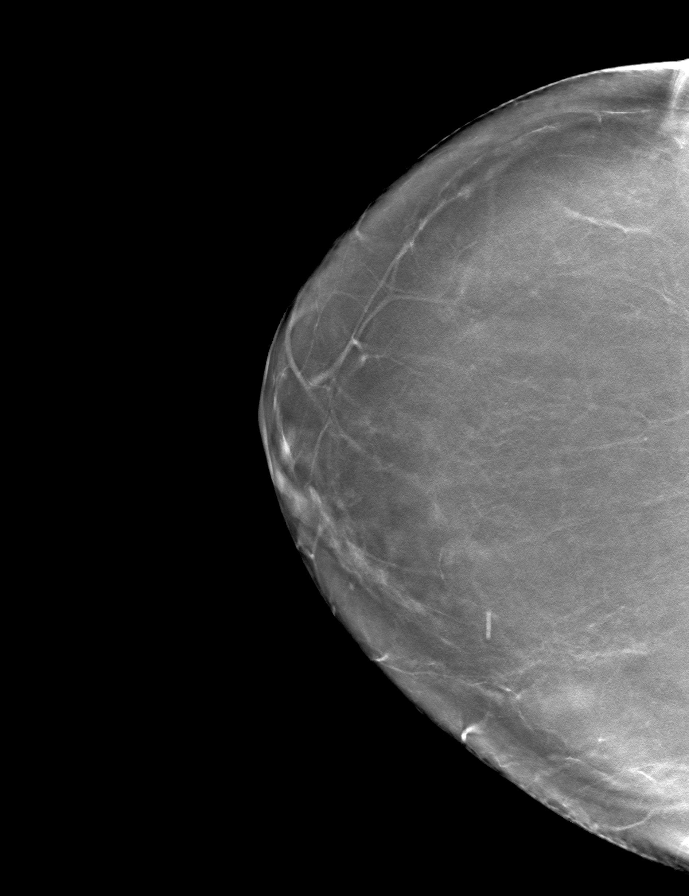

[R CC tomo · tomo slice 42/83.0]
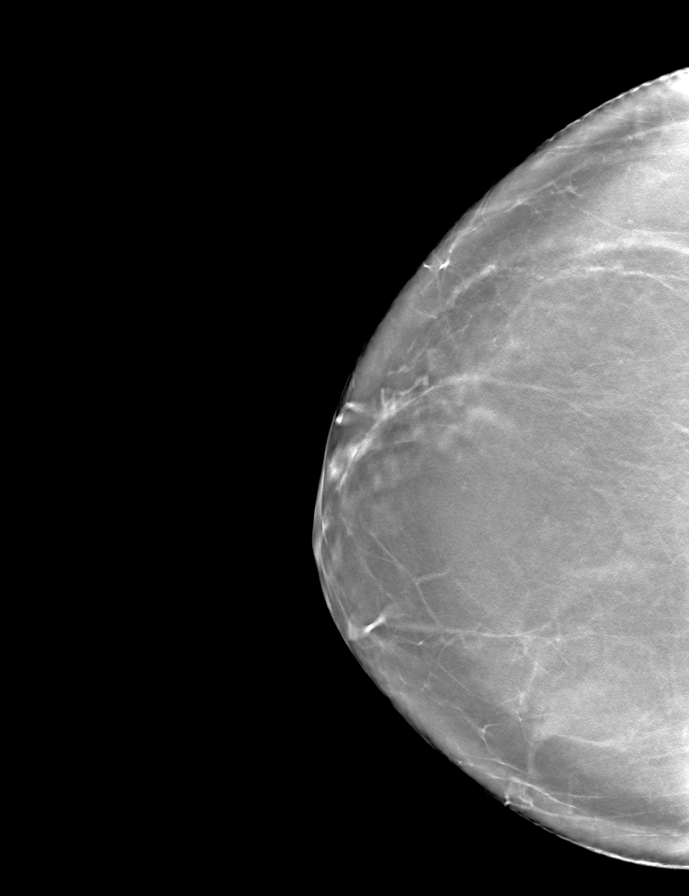

[9 of 24 positions shown; findings below may reference images not displayed]

IMPRESSION: There is no mammographic evidence of malignancy. A 1 year screening mammogram is recommended.
mc/penrad:09/04/2015 [DATE]
letter sent: Normal
Mammogram BI-RADS: 1 Negative

## 2015-09-02 IMAGING — OT DXA BONE DENSITY
2 series · 2 of 2 positions shown · non-contrast
Comparison: none

REASON FOR EXAM: Postmenopausal screening

RISK FACTORS:  None.
PRIOR EXAMS:  DXA from [HOSPITAL] dated 10/25/2006.
METHOD:  Scans of the spine and hip were performed using dual energy X-ray densitometry (DXA) with the Hologic Discovery SL ([id number])  system.

[Series 1: — · 1 of 1 slices shown (1 of 2)]
[im 1/1]
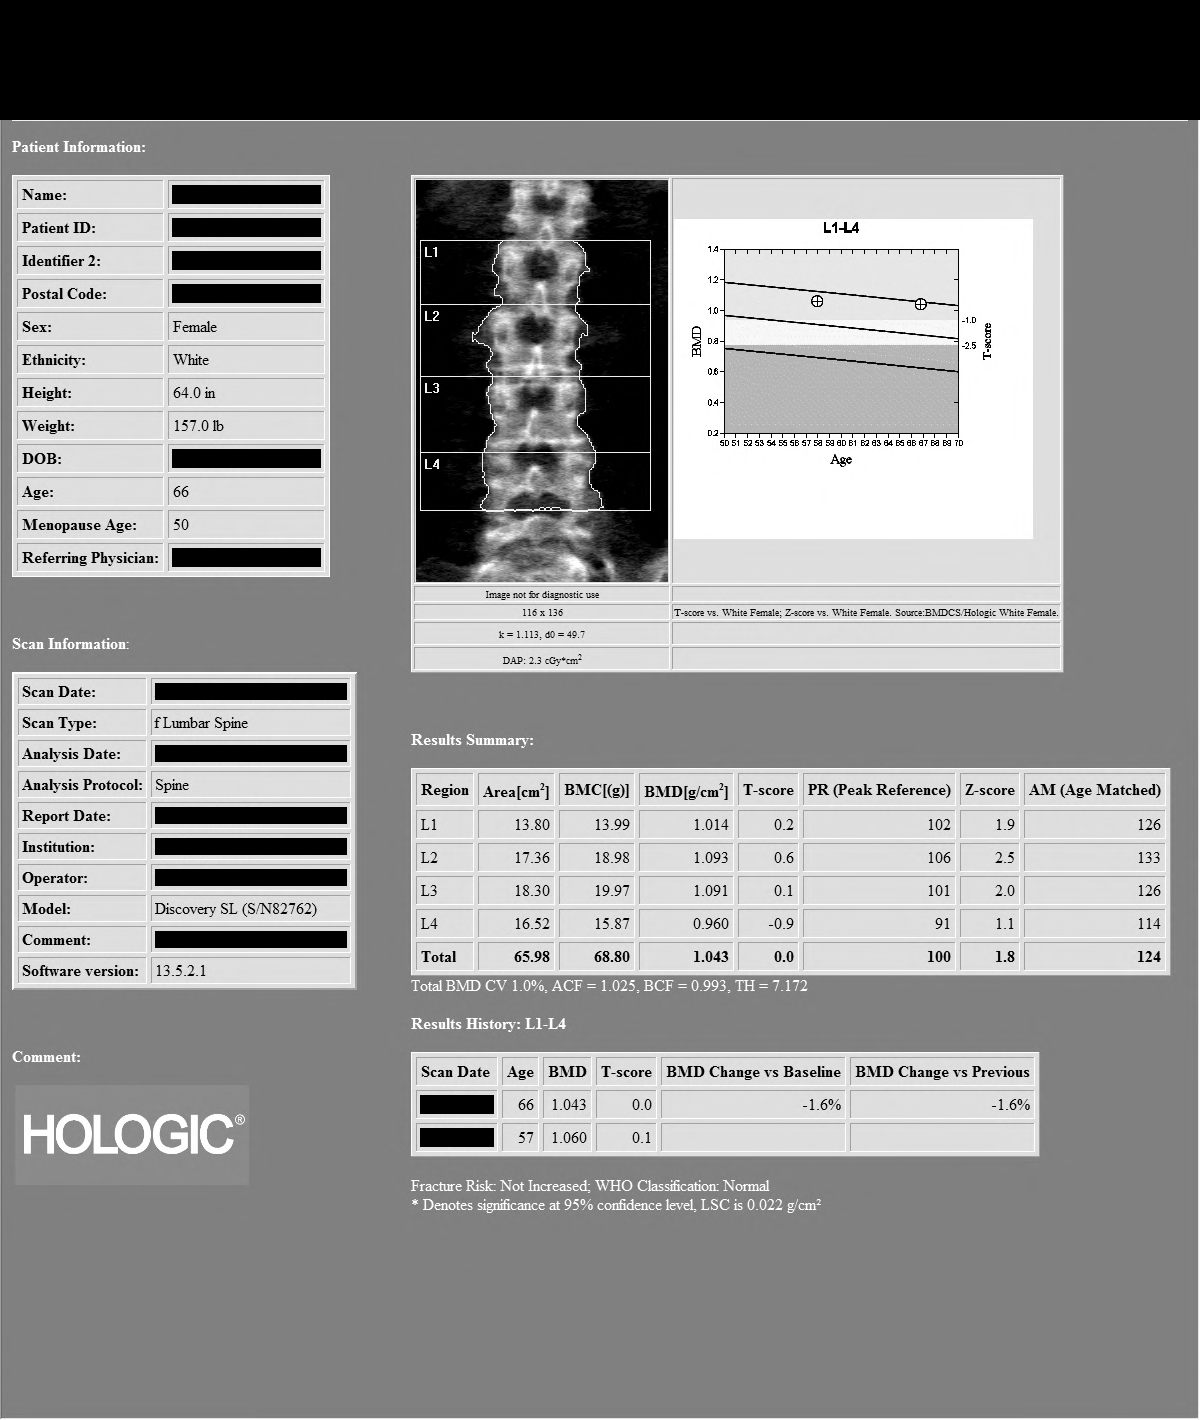

[Series 2: — · left · 1 of 1 slices shown (2 of 2)]
[im 1/1]
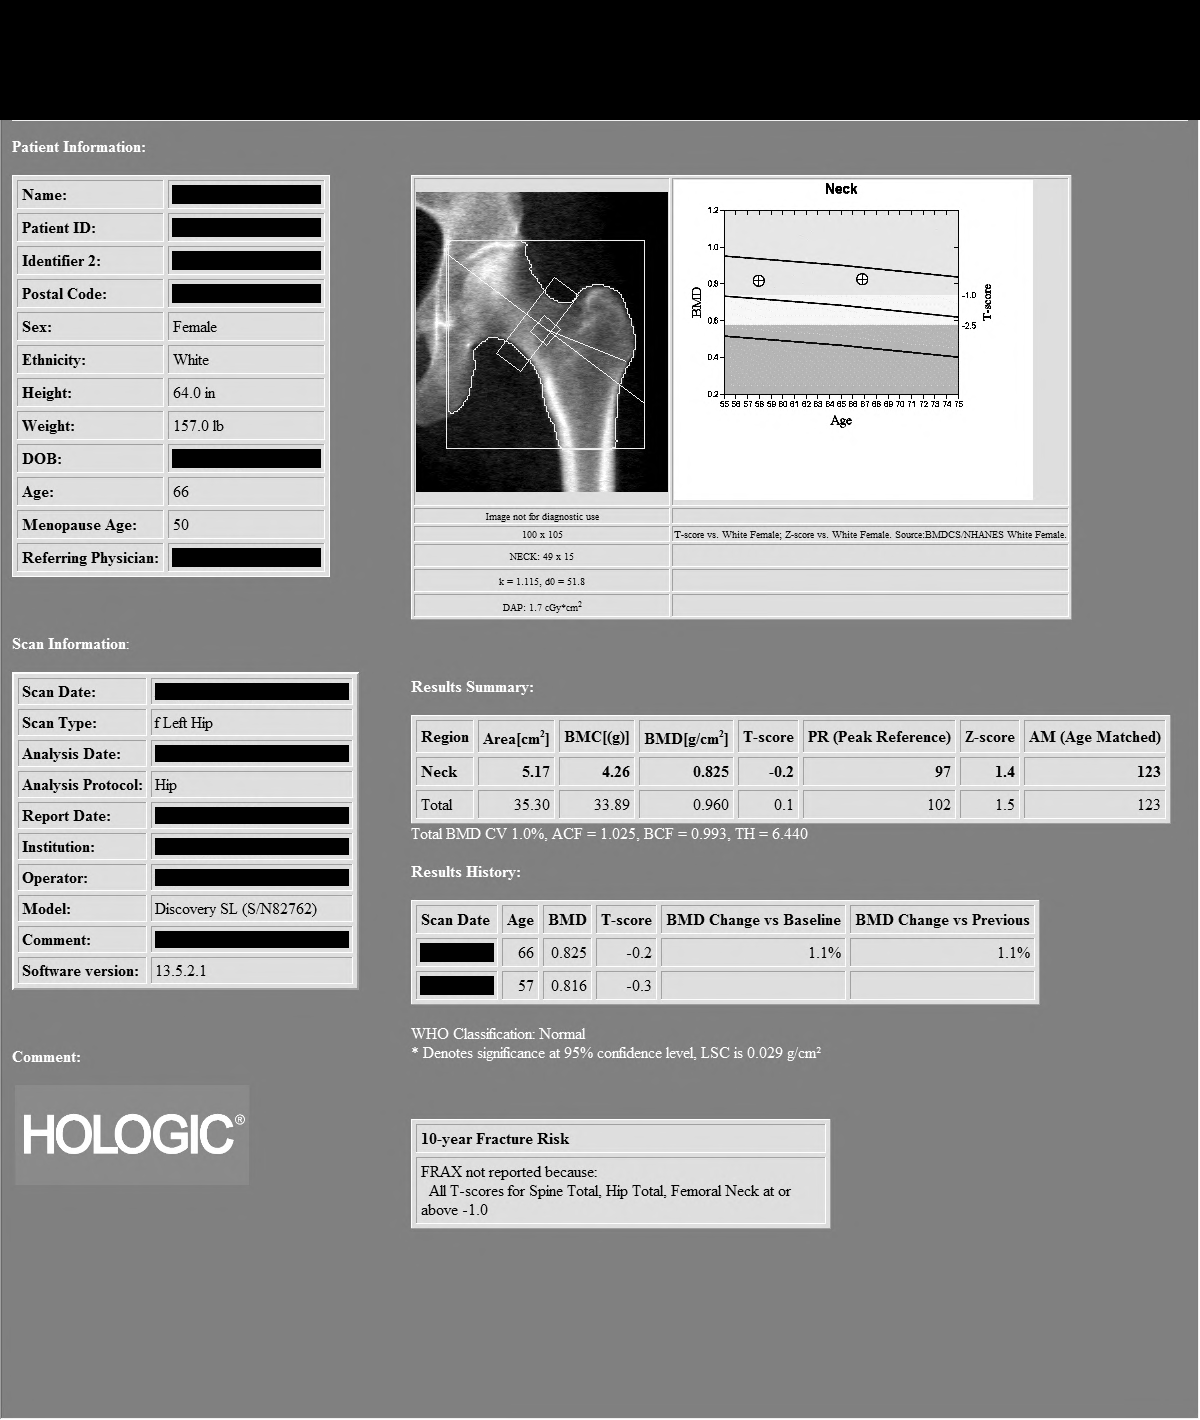

[2 of 2 positions shown; findings below may reference images not displayed]

IMPRESSION: As defined by World Health Organization, the patient meets the criteria for NORMAL BONE DENSITY based on both spine and hip T-scores.

PATIENT DEMOGRAPHICS:  64 year old White female.
FINDINGS: 1.    Review of scanogram images shows mild arthritic changes in the spine that can falsely elevate results.  No vertebra is excluded from analysis.

2.    The lumbar spine exam using L1-L4 regions shows average Bone Mineral Density is 1.043 gm/cm2 of Hydroxyapatite.  The T-score (comparing patient with a young adult group) is 0.0 standard deviations AT mean. The Z-score (comparing patient with an age-matched group) is 1.8 standard deviations ABOVE mean.

COMPARED TO PRIOR DXA, spine bone density was 1.060 gm/cm2.  This is an interval decrease of 0.017 gm/cm2 or -1.6 %. Technologist least significant change in the spine is 0.030 gm/cm2. This is not a statistically significant interval decrease.

3.  The left hip exam using femoral neck region of interest shows average Bone Mineral Density is 0.825 gm/cm2 of Hydroxyapatite. The T-score (comparing patient with a young adult group) is 0.2 standard deviations BELOW mean. The Z-score (comparing patient with an age-matched group) is 1.4 standard deviations ABOVE mean.

COMPARED TO PRIOR DXA, femoral neck bone density was 0.816 gm/cm2.  This is an interval increase of 0.009 gm/cm2 or 1.1 %. Technologist least significant change in the hip is 0.032 gm/cm2. This is not a statistically significant interval increase.

RECOMMENDATIONS:  The patient states that she is taking supplements on a regular basis. Patient's need for supplements may be reviewed given normal bone density. The patient should continue being a nonsmoker and regular exercise to patient tolerance would be of benefit.  The patient is currently not taking prescribed medication for prevention of bone loss.  According to new criteria established by The National Osteoporosis Foundation in 5664, THE PATIENT DOES NOT MEET THE CURRENT INDICATIONS FOR PRESCRIBED MEDICAL THERAPY.

World Health Organization criteria for diagnosis, please see link below.

[URL]

## 2016-08-08 ENCOUNTER — Ambulatory Visit: Primary: Family Medicine

## 2016-08-25 ENCOUNTER — Inpatient Hospital Stay: Admit: 2016-08-25 | Payer: MEDICARE | Primary: Family Medicine

## 2016-08-25 ENCOUNTER — Encounter

## 2016-08-25 DIAGNOSIS — Z1231 Encounter for screening mammogram for malignant neoplasm of breast: Secondary | ICD-10-CM

## 2016-08-31 ENCOUNTER — Encounter

## 2016-08-31 ENCOUNTER — Inpatient Hospital Stay: Admit: 2016-08-31 | Payer: MEDICARE | Primary: Family Medicine

## 2016-08-31 DIAGNOSIS — R922 Inconclusive mammogram: Secondary | ICD-10-CM

## 2017-03-09 ENCOUNTER — Ambulatory Visit: Admit: 2017-03-09 | Discharge: 2017-03-09 | Payer: MEDICARE | Attending: Family | Primary: Family Medicine

## 2017-03-09 DIAGNOSIS — R3129 Other microscopic hematuria: Secondary | ICD-10-CM

## 2017-03-09 LAB — POCT URINALYSIS DIPSTICK W/O MICROSCOPE (AUTO)
Bilirubin, UA: NEGATIVE
Glucose, UA POC: NEGATIVE
Ketones, UA: NEGATIVE
Leukocytes, UA: NEGATIVE
Nitrite, UA: NEGATIVE
Protein, UA POC: NEGATIVE
Spec Grav, UA: 1.02
Urobilinogen, UA: 0.2
pH, UA: 7

## 2017-03-09 LAB — POST VOID RESIDUAL (PVR): post void residual: 75 ml

## 2017-03-09 NOTE — Progress Notes (Signed)
[facility]  [facility]  [address] [place] [address]  Dept: [phone]  Dept Fax: [phone]  Loc: [phone]      Visit Date: 03/09/2017    HPI:     Michelle Bright is a new patient who was referred here by Dr. Jannette Fogo for recurrent UTI. She has had three E. Coli UTI in the previous three months. All have been treated with antibiotics, however, symptoms return.   Urine culture on 02/20/17 grew E. Coli. Urine culture on 11/30/16 grew E. Coli. Urine culture on 11/12/16 grew E. Coli.   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 Prior to the stroke she was getting up once and could hold it 4+ hours during the day. She has a remote history of tobacco abuse. She denies any history of kidney stones. She denies any gross hematuria.    Past Medical History:   Diagnosis Date   . Arthritis    . Atrial fibrillation (HCC)    . CAD (coronary artery disease)     Afib    . Hypertension    . Osteoporosis    . Thyroid disease     Removed       Past Surgical History:   Procedure Laterality Date   . FEMUR SURGERY  12/2014    left femur   . FRACTURE SURGERY     . THYROID SURGERY  2005       No family history on file.    Social History   Substance Use Topics   . Smoking status: Former Smoker     Quit date: 03/09/1992   . Smokeless tobacco: Never Used   . Alcohol use Not on file      Current Outpatient Prescriptions   Medication Sig Dispense Refill   . nitroGLYCERIN (NITROSTAT) 0.4 MG SL tablet up to max of 3 total doses. If no relief after 1 dose, call 911. 25 tablet 3   . acetaminophen (TYLENOL) 500 MG tablet Take 500 mg by mouth every 6 hours as needed for Pain     . warfarin (COUMADIN) 2 MG tablet Take 2 mg by mouth daily Takes 4 mg on Sunday and Wednesday, all other days takes 2 mg.     . levothyroxine (SYNTHROID) 75 MCG tablet Take 75 mcg by mouth Daily     .  lisinopril-hydrochlorothiazide (PRINZIDE;ZESTORETIC) 20-12.5 MG per tablet Take 1 tablet by mouth daily     . pravastatin (PRAVACHOL) 40 MG tablet Take 40 mg by mouth daily     . aspirin 81 MG tablet Take 81 mg by mouth daily     . IRON PO Take 65 mg by mouth 2 times daily     . Calcium Carb-Cholecalciferol (CALCIUM + D3 PO) Take 1,200 mg by mouth 2 times daily     . furosemide (LASIX) 40 MG tablet Take 40 mg by mouth daily     . flecainide (TAMBOCOR) 100 MG tablet Take 50 mg by mouth 2 times daily       No current facility-administered medications for this visit.      No Known Allergies    ROS:  Constitutional: Negative for chills, fatigue, fever, or weight loss.  Eyes: Denies reported visual changes.  ENT: Denies headache, difficulty swallowing, nose bleeds, ringing in ears, or earaches.  Cardiovascular:  Negative for chest pain, palpitations, tachycardia or edema.  Respiratory: Denies cough or SOB.   GI:The patient denies abdominal or flank pain, anorexia, nausea or vomiting.  GU: See HPI  Musculoskeletal: Patient denies low back pain or painful or reduced ROM of the back or extremities.  Neurological: The patient denies any symptoms of neurological impairment or TIA's; no history of stroke.  Lymphatic: Denies swollen glands in neck, axillary or inguinal areas.  Psychiatric: Denies anxiety or depression.  Skin: Denies rash or lesions.  The remainder of the complete ROS is negative    PHYSICAL EXAM:  VITALS:  BP 136/78   Ht 4\' 11"  (1.499 m)   Wt 111 lb (50.3 kg)   BMI 22.42 kg/m .    Constitutional:    Alert and oriented times 3, no acute distress and cooperative to examination with appropriate mood and affect.   HEENT:   Head:         Normocephalic and atraumatic.   Mouth/Throat:          Mucous membranes are normal.   Eyes:         EOM are normal. No scleral icterus.  Nose:    The external appearance of the nose is normal  Ears:     The ears appear normal to external inspection   Neck:         Supple,  symmetrical, trachea midline, no adenopathy, thyroid symmetric, not enlarged and no tenderness.   Cardiovascular:        Normal rate, regular rhythm, S1 S2 heart sounds.    Pulmonary/Chest:       Chest symmetric with normal A/P diameter, no wheezes, rales, or rhonchi noted. Normal respiratory rate and rhthym.  No use of accessory muscles.   Abdominal:          Soft. No tenderness. Active bowel sounds.     Musculoskeletal:    Normal range of motion. She exhibits no edema or tenderness of lower extremities.    Extremities:    No cyanosis, clubbing, or edema present.  Neurological:    Alert and oriented. No cranial nerve deficit. There are no focalizing motor or sensory deficits.    DATA:  CBC:   Lab Results   Component Value Date    WBC 8.7 04/11/2015    RBC 4.24 04/11/2015    HGB 13.4 04/11/2015    HCT 39.7 04/11/2015    MCV 93.5 04/11/2015    MCH 31.5 04/11/2015    MCHC 33.7 04/11/2015    RDW 15.2 04/11/2015    PLT 159 04/11/2015    MPV 9.2 04/11/2015     BMP:    Lab Results   Component Value Date    NA 142 04/11/2015    K 3.8 04/11/2015    CL 98 04/11/2015    CO2 29 04/11/2015    BUN 38 04/11/2015    CREATININE 1.0 04/11/2015    CALCIUM 9.4 04/11/2015    LABGLOM 55 04/11/2015    GLUCOSE 105 04/11/2015     BUN/Creatinine:    Lab Results   Component Value Date    BUN 38 04/11/2015    CREATININE 1.0 04/11/2015     Magnesium:    Lab Results   Component Value Date    MG 1.8 04/09/2015     Phosphorus:  No results found for: PHOS  PT/INR:    Lab Results   Component Value Date    INR 2.17 04/10/2015     U/A:  No results found for: NITRITE, COLORU, PHUR, LABCAST, WBCUA, RBCUA, MUCUS, TRICHOMONAS, YEAST, BACTERIA, CLARITYU, SPECGRAV, LEUKOCYTESUR, UROBILINOGEN, BILIRUBINUR, BLOODU, GLUCOSEU, AMORPHOUS    Results for POC orders placed in visit on 03/09/17   POCT Urinalysis No Micro (Auto)   Result Value Ref Range    Color, UA yellow     Clarity, UA clear     Glucose, UA POC neg     Bilirubin, UA neg     Ketones, UA neg      Spec Grav, UA 1.020     Blood, UA POC trace     pH, UA 7.0     Protein, UA POC neg     Urobilinogen, UA 0.2     Leukocytes, UA neg     Nitrite, UA neg    poct post void residual   Result Value Ref Range    post void residual 75 ml         Assessment & Plan:      Microscopic Hematuria  Recurrent UTI  Urinary Frequency  Urinary Urgency    All referral documents were reviewed.    She has had repeated E. Coli infections. She is emptying her bladder fairly well today. Start double voiding.    Continue Oxybutynin 5 mg XL nightly for nocturia.    We will obtain CT urogram due to trace blood today, recurrent UTI, and secondhand smoke exposure and history of tobacco use. We will call results.    Start D-Mannose for UTI prevention.     Follow-up in 6 months.          Electronically signed by Regan Lemming, APRN - CNP on 03/09/2017 at 2:10 PM

## 2017-03-09 NOTE — Patient Instructions (Signed)
Start D-Mannose 1500 mg daily for UTI prevention.

## 2017-03-20 ENCOUNTER — Inpatient Hospital Stay: Admit: 2017-03-20 | Payer: MEDICARE | Primary: Family Medicine

## 2017-03-20 DIAGNOSIS — R3129 Other microscopic hematuria: Secondary | ICD-10-CM

## 2017-03-20 LAB — POCT CREATININE: POC CREATININE WHOLE BLOOD: 0.7 mg/dl (ref 0.5–1.2)

## 2017-03-20 MED ORDER — IOPAMIDOL 76 % IV SOLN
76 % | Freq: Once | INTRAVENOUS | Status: AC | PRN
Start: 2017-03-20 — End: 2017-03-20
  Administered 2017-03-20: 15:00:00 85 mL via INTRAVENOUS

## 2017-03-21 NOTE — Telephone Encounter (Signed)
Patients husband [name] (on hippa) calling in for patient. He has a question about the oxybutynin she has been taking. He said she had been having problems with urgency, frequency, burning with urination and low back pain and those are side effects listed for the oxybutynin. He said currently she is not having the symptoms and he was asking if she should continue the oxybutynin or stop it to see what happens. Please call him to discuss.

## 2017-03-21 NOTE — Telephone Encounter (Signed)
The husband wanted the office to also know the ditropan was prn at first and then changed to nightly. He feels the D Mannose has given her better management of her symptoms. Please advise. Thank you.

## 2017-03-21 NOTE — Telephone Encounter (Signed)
Please let Michelle Bright know that CT was negative. It only showed a renal cyst which is benign and nothing to worry about. Nothing to explain the recurrent infections.    Get the [facility] like I suggested. Increase water intake.    Call if she thinks she is getting an infection and we will check a urine.

## 2017-03-21 NOTE — Telephone Encounter (Signed)
I called the patient and spoke to her husband [name] on HIPPA. I advised her of the message from [place] CNP. CT was negative. It only showed a renal cyst which is benign and nothing to worry about. Nothing to explain the recurrent infections.  ??  Get the D-Mannose like I suggested. Increase water intake.  ??  Call if she thinks she is getting an infection and we will check a urine. He voiced understanding.

## 2017-03-21 NOTE — Telephone Encounter (Signed)
Side effects of ditropan are dry mouth and constipation.    She can just take it as needed. If taking it every night is causing side effects she doesn't have to take it. It's just for symptom control, to help with frequency and urgency. I offered to change medication at office visit but she wanted to stay on ditropan.    Continue D-Mannose. Glad it's helping.

## 2017-03-21 NOTE — Telephone Encounter (Signed)
Called and spoke with husband and stated that she has been having these issues after starting the Ditropan, wants to know if she should still take it or stop. Has bee taking the [facility] and it is working.

## 2017-03-21 NOTE — Telephone Encounter (Signed)
I called the patient and spoke to her husband [name]. I advised him of the message from [place] CNP. The ditropan can be taking as needed. If taking it every night is causing side effects she doesn't have to take it. It's just for symptom control, to help with frequency and urgency. Continue D-Mannose. He voiced understanding.

## 2017-05-02 ENCOUNTER — Telehealth

## 2017-05-02 MED ORDER — NITROFURANTOIN MONOHYD MACRO 100 MG PO CAPS
100 MG | ORAL_CAPSULE | Freq: Two times a day (BID) | ORAL | 0 refills | Status: AC
Start: 2017-05-02 — End: 2017-05-07

## 2017-05-02 NOTE — Telephone Encounter (Signed)
Patient's husband called and reports that patient has dysuria and feels she has a UTI.    Please fax UA with reflex culture order to [facility] in [facility] (In Dr. Peggyann Juba office).    Macrobid sent to start after giving urine.

## 2017-05-03 NOTE — Telephone Encounter (Signed)
I faxed an order for the urine to Dr Arvil Chaco office [phone]. I called Rocky on HIPPA and advised him the order was faxed and to start the Macrobid after giving the urine. He voiced understanding.

## 2017-05-05 LAB — CULTURE, URINE

## 2017-05-05 NOTE — Telephone Encounter (Signed)
Urine culture came back negative. Please see if symptoms have improved. Thanks!

## 2017-05-05 NOTE — Telephone Encounter (Signed)
The patient denies burning,fever, or chills. She feels much better since starting the antibiotics. She voiced understanding the urine was negative.

## 2017-05-12 IMAGING — US DOP VENOUS LWR EXT BIL
1 series · 15 of 24 positions shown · non-contrast
Comparison: No previous venous Doppler.

HISTORY: Pain to both legs, pulmonary embolism

[Series 1: dop venous lwr ext bil · portal-venous · 15 of 37 slices shown]
[im 1/37]
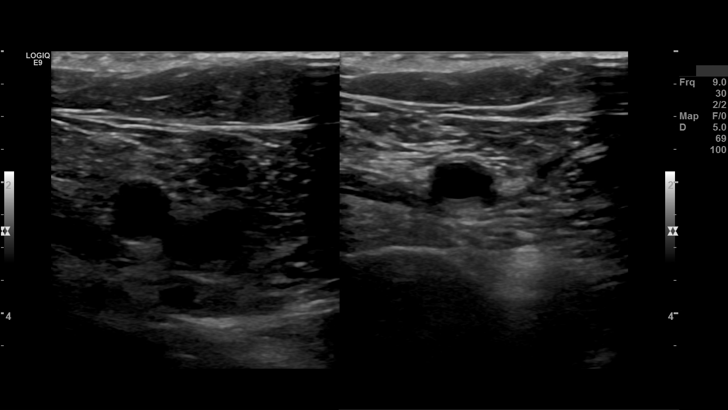
[im 4/37]
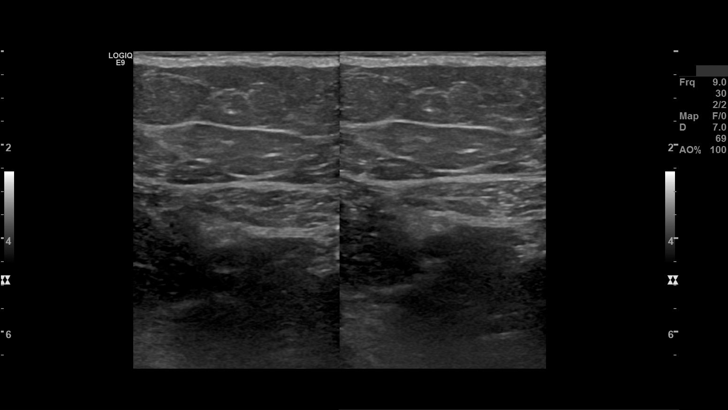
[im 7/37]
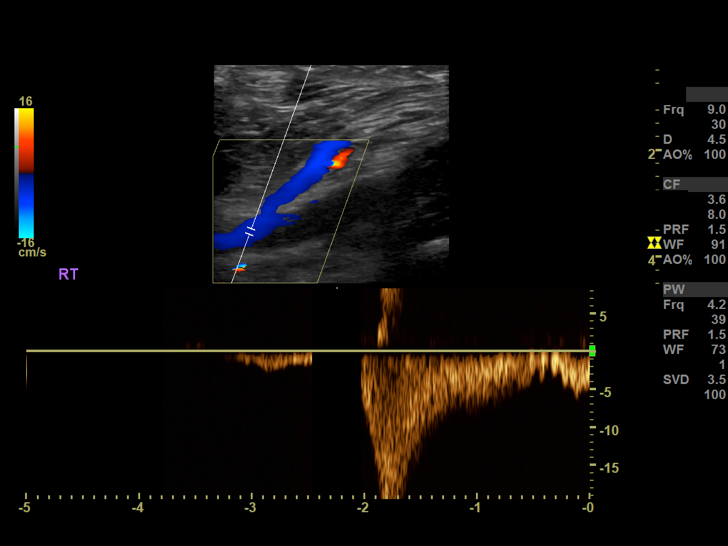
[im 8/37]
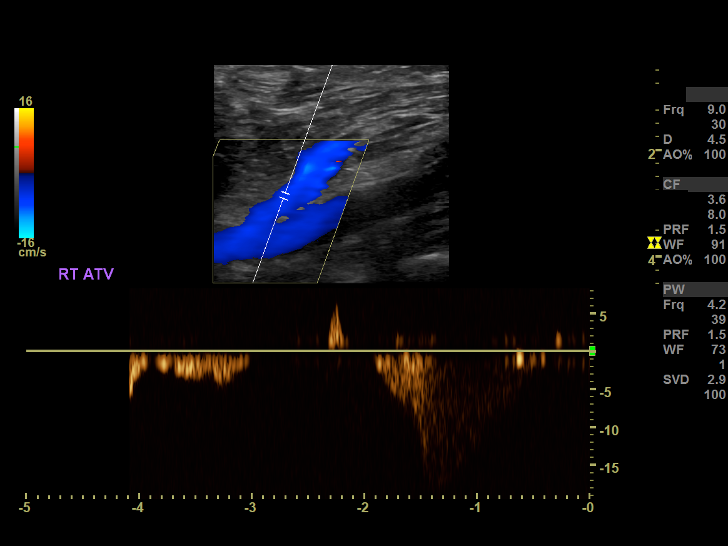
[im 11/37]
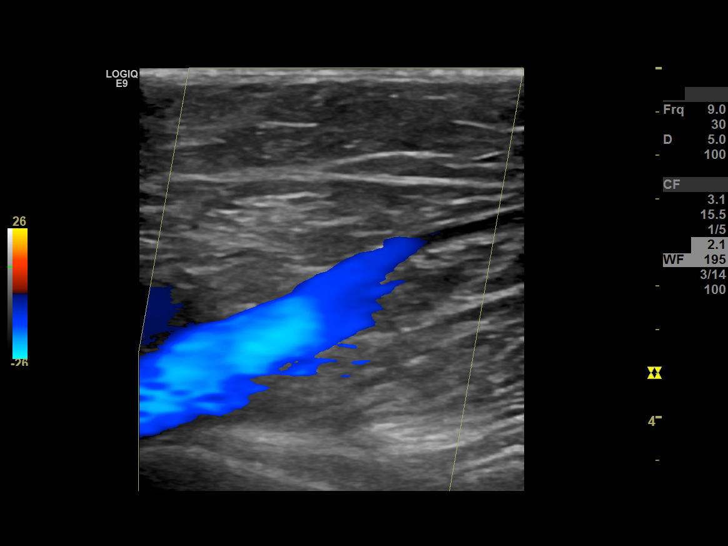
[im 13/37]
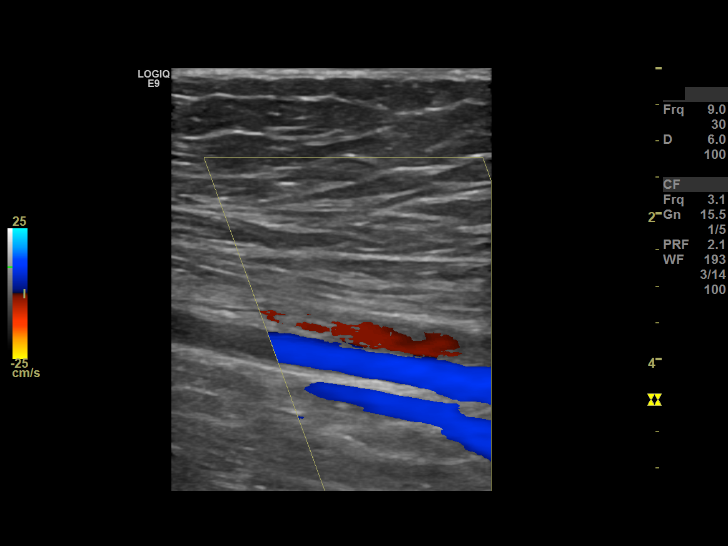
[im 16/37]
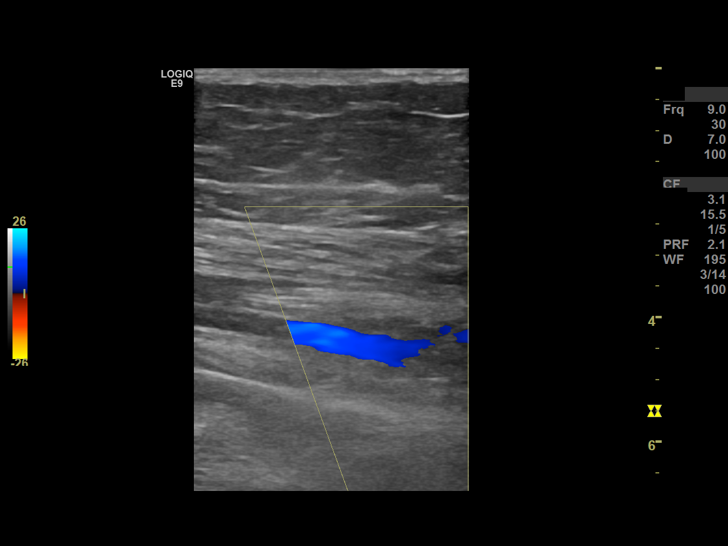
[im 19/37]
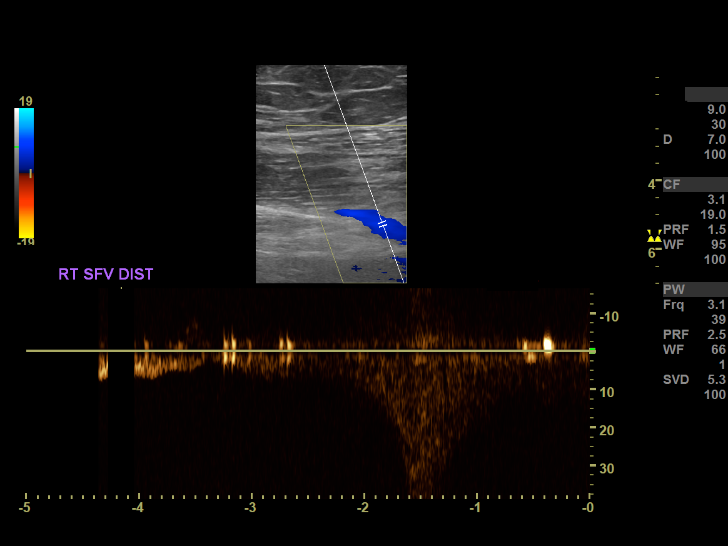
[im 21/37]
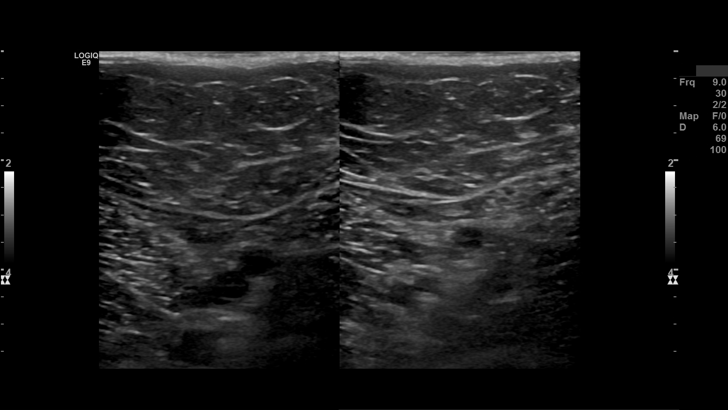
[im 24/37]
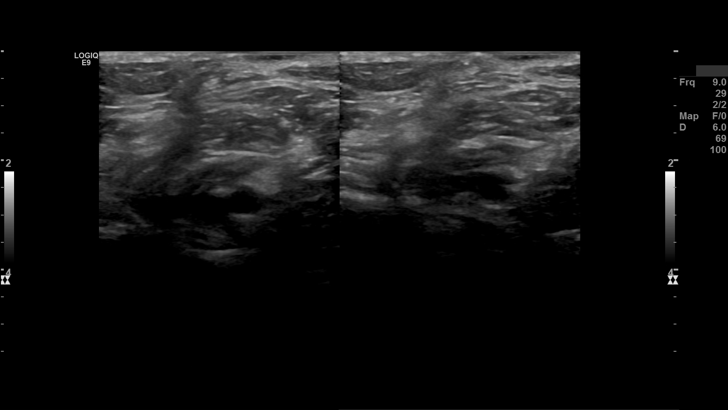
[im 26/37]
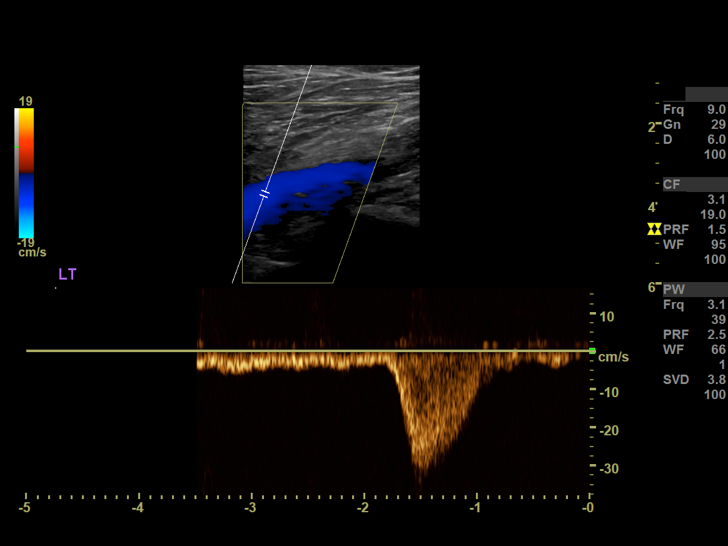
[im 29/37]
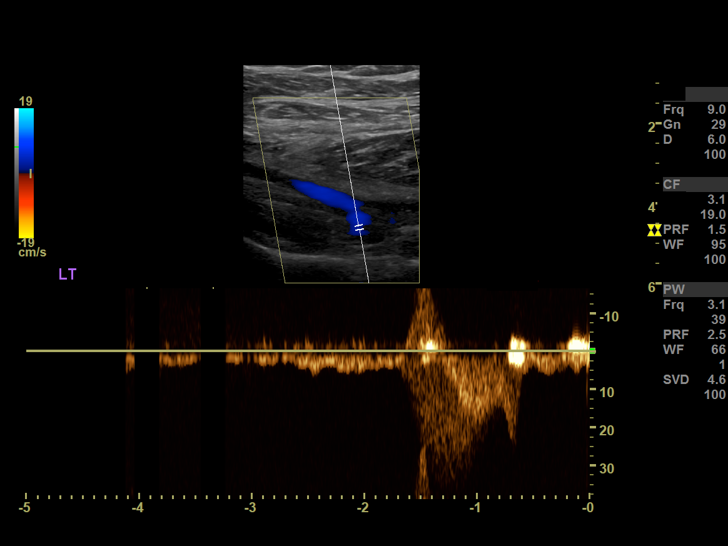
[im 32/37]
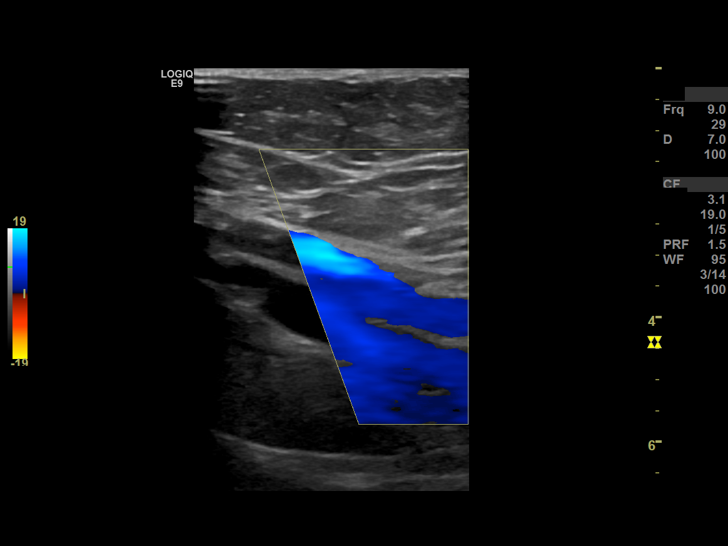
[im 33/37]
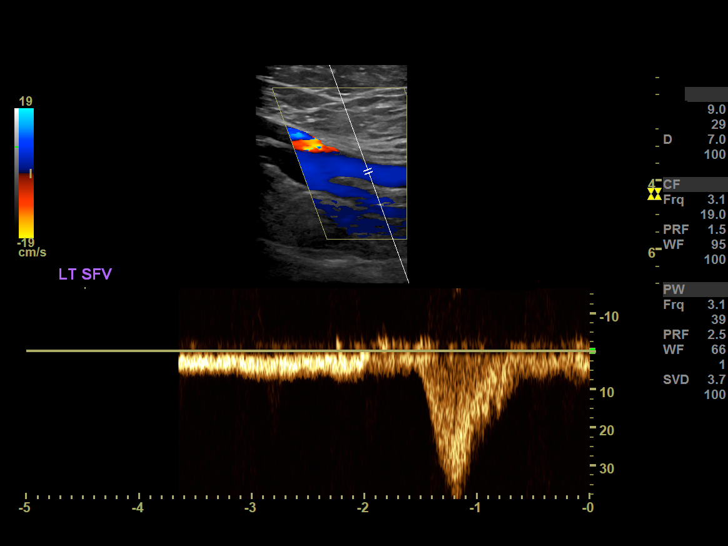
[im 37/37]
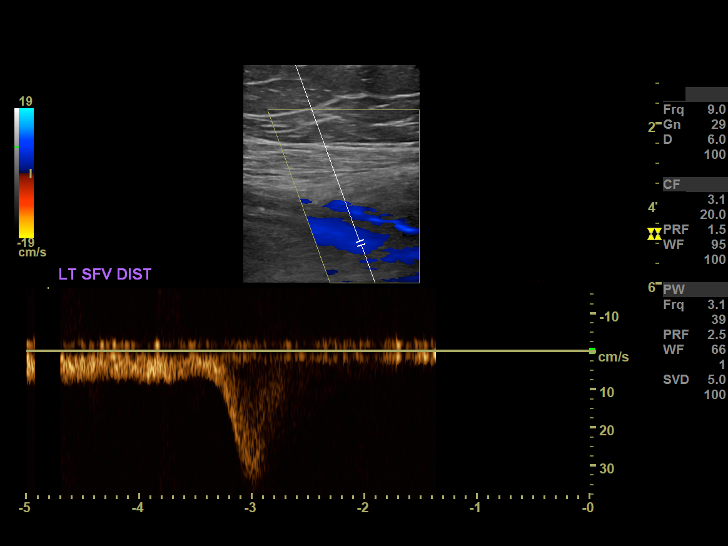

[15 of 24 positions shown; findings below may reference images not displayed]

FINDINGS: BILATERAL LEG VENOUS DOPPLER:  The veins of the right and left lower extremities were interrogated from the visible common femoral vein to the distal popliteal vein.  Flow is present in these vessels.  They demonstrate normal compressibility and augmentation of flow with appropriate maneuvers. Normal respiratory variation of flow is noted.
IMPRESSION: No evidence of deep venous thrombosis.

## 2017-08-31 ENCOUNTER — Ambulatory Visit: Admit: 2017-08-31 | Discharge: 2017-08-31 | Payer: MEDICARE | Attending: Family | Primary: Family Medicine

## 2017-08-31 DIAGNOSIS — R350 Frequency of micturition: Secondary | ICD-10-CM

## 2017-08-31 LAB — POST VOID RESIDUAL (PVR): post void residual: 34 ml

## 2017-08-31 MED ORDER — MIRABEGRON ER 50 MG PO TB24
50 MG | ORAL_TABLET | Freq: Every day | ORAL | 0 refills | Status: DC
Start: 2017-08-31 — End: 2018-02-28

## 2017-08-31 NOTE — Progress Notes (Signed)
[facility]  [facility]  [address] [place] [address]  Dept: [phone]  Dept Fax: [phone]  Loc: [phone]      Visit Date: 08/31/2017    HPI:     Michelle Bright presents for follow-up of overactive bladder and recurrent UTI. She has had three E. Coli UTI in the previous three months. All have been treated with antibiotics, however, symptoms return.   Urine culture on 02/20/17 grew E. Coli. Urine culture on 11/30/16 grew E. Coli. Urine culture on 11/12/16 grew E. Coli.   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 Prior to the stroke she was getting up once and could hold it 4+ hours during the day. She has a remote history of tobacco abuse. She denies any history of kidney stones. She denies any gross hematuria.    Past Medical History:   Diagnosis Date   ??? Arthritis    ??? Atrial fibrillation (HCC)    ??? CAD (coronary artery disease)     Afib    ??? Hypertension    ??? Osteoporosis    ??? Thyroid disease     Removed       Past Surgical History:   Procedure Laterality Date   ??? FEMUR SURGERY  12/2014    left femur   ??? FRACTURE SURGERY     ??? THYROID SURGERY  2005       No family history on file.    Social History     Tobacco Use   ??? Smoking status: Former Smoker     Last attempt to quit: 03/09/1992     Years since quitting: 25.4   ??? Smokeless tobacco: Never Used   Substance Use Topics   ??? Alcohol use: Not on file      Current Outpatient Medications   Medication Sig Dispense Refill   ??? NONFORMULARY D Mannose     ??? atorvastatin (LIPITOR) 40 MG tablet Take 40 mg by mouth daily     ??? warfarin (COUMADIN) 2 MG tablet Take 3 mg by mouth daily      ??? levothyroxine (SYNTHROID) 75 MCG tablet Take 75 mcg by mouth Daily     ??? lisinopril-hydrochlorothiazide (PRINZIDE;ZESTORETIC) 20-12.5 MG per tablet Take 1 tablet by mouth daily     ??? aspirin 81 MG tablet Take 81 mg by mouth daily     ???  IRON PO Take 65 mg by mouth 2 times daily     ??? Calcium Carb-Cholecalciferol (CALCIUM + D3 PO) Take 1,200 mg by mouth 2 times daily     ??? flecainide (TAMBOCOR) 100 MG tablet Take 50 mg by mouth 2 times daily     ??? nitroGLYCERIN (NITROSTAT) 0.4 MG SL tablet up to max of 3 total doses. If no relief after 1 dose, call 911. (Patient not taking: Reported on 08/31/2017) 25 tablet 3   ??? acetaminophen (TYLENOL) 500 MG tablet Take 500 mg by mouth every 6 hours as needed for Pain       No current facility-administered medications for this visit.      No Known Allergies    ROS:  Constitutional: Negative for chills, fatigue, fever, or weight loss.  Eyes: Denies reported visual changes.  ENT: Denies headache, difficulty swallowing, nose bleeds, ringing in ears, or earaches.  Cardiovascular: Negative for chest pain,  palpitations, tachycardia or edema.  Respiratory: Denies cough or SOB.   GI:The patient denies abdominal or flank pain, anorexia, nausea or vomiting.  GU: See HPI  Musculoskeletal: Patient denies low back pain or painful or reduced ROM of the back or extremities.  Neurological: The patient denies any symptoms of neurological impairment or TIA's; no history of stroke.  Lymphatic: Denies swollen glands in neck, axillary or inguinal areas.  Psychiatric: Denies anxiety or depression.  Skin: Denies rash or lesions.  The remainder of the complete ROS is negative    PHYSICAL EXAM:  VITALS:  BP 122/78    Ht 4\' 11"  (1.499 m)    Wt 106 lb (48.1 kg)    BMI 21.41 kg/m?? .    Constitutional:    Alert and oriented times 3, no acute distress and cooperative to examination with appropriate mood and affect.   HEENT:   Head:         Normocephalic and atraumatic.   Mouth/Throat:          Mucous membranes are normal.   Eyes:         EOM are normal. No scleral icterus.  Nose:    The external appearance of the nose is normal  Ears:     The ears appear normal to external inspection   Neck:         Supple, symmetrical, trachea midline, no  adenopathy, thyroid symmetric, not enlarged and no tenderness.   Cardiovascular:        Normal rate, regular rhythm, S1 S2 heart sounds.    Pulmonary/Chest:       Chest symmetric with normal A/P diameter, no wheezes, rales, or rhonchi noted. Normal respiratory rate and rhthym.  No use of accessory muscles.   Abdominal:          Soft. No tenderness. Active bowel sounds.     Musculoskeletal:    Normal range of motion. She exhibits no edema or tenderness of lower extremities.    Extremities:    No cyanosis, clubbing, or edema present.  Neurological:    Alert and oriented. No cranial nerve deficit. There are no focalizing motor or sensory deficits.    DATA:  CBC:   Lab Results   Component Value Date    WBC 8.7 04/11/2015    RBC 4.24 04/11/2015    HGB 13.4 04/11/2015    HCT 39.7 04/11/2015    MCV 93.5 04/11/2015    MCH 31.5 04/11/2015    MCHC 33.7 04/11/2015    RDW 15.2 04/11/2015    PLT 159 04/11/2015    MPV 9.2 04/11/2015     BMP:    Lab Results   Component Value Date    NA 142 04/11/2015    K 3.8 04/11/2015    CL 98 04/11/2015    CO2 29 04/11/2015    BUN 38 04/11/2015    CREATININE 1.0 04/11/2015    CALCIUM 9.4 04/11/2015    LABGLOM 55 04/11/2015    GLUCOSE 105 04/11/2015     BUN/Creatinine:    Lab Results   Component Value Date    BUN 38 04/11/2015    CREATININE 1.0 04/11/2015     Magnesium:    Lab Results   Component Value Date    MG 1.8 04/09/2015     Phosphorus:  No results found for: PHOS  PT/INR:    Lab Results   Component Value Date    INR 2.17 04/10/2015     U/A:  Lab Results   Component Value Date    NITRITE neg 03/09/2017    COLORU yellow 03/09/2017    PHUR 7.0 03/09/2017    CLARITYU clear 03/09/2017    SPECGRAV 1.020 03/09/2017    LEUKOCYTESUR neg 03/09/2017    BILIRUBINUR neg 03/09/2017    BLOODU trace 03/09/2017    GLUCOSEU neg 03/09/2017         Assessment & Plan:      Microscopic Hematuria  Recurrent UTI  Urinary Frequency  Urinary Urgency    Continue D-Mannose for UTI prevention. No infections since  February 2019.    Myrbetriq samples given. They will call if they want script. She had severe side effects from anti-cholinergics. We will notify PCP due to patient being on flecainide.    Follow-up in 6 months.          Electronically signed by Regan Lemming, APRN - CNP on 08/31/2017 at 8:58 AM

## 2017-09-06 ENCOUNTER — Inpatient Hospital Stay: Admit: 2017-09-06 | Payer: MEDICARE | Primary: Family Medicine

## 2017-09-06 ENCOUNTER — Encounter

## 2017-09-06 DIAGNOSIS — Z1231 Encounter for screening mammogram for malignant neoplasm of breast: Secondary | ICD-10-CM

## 2017-09-13 IMAGING — OT DXA BONE DENSITY
2 series · 2 of 2 positions shown · non-contrast
Comparison: none

REASON FOR EXAM: Evaluate bone density.

RISK FACTORS:  None
PRIOR EXAMS:  DXA from [HOSPITAL] dated 09/02/2015 diagnosed as normal bone density based on spine and left hip T-scores.
METHOD:  Scans of the spine and hip were performed using dual energy X-ray densitometry (DXA) with the Hologic Discovery SL ([id number])  system.

[Series 1: — · 1 of 1 slices shown (1 of 2)]
[im 1/1]
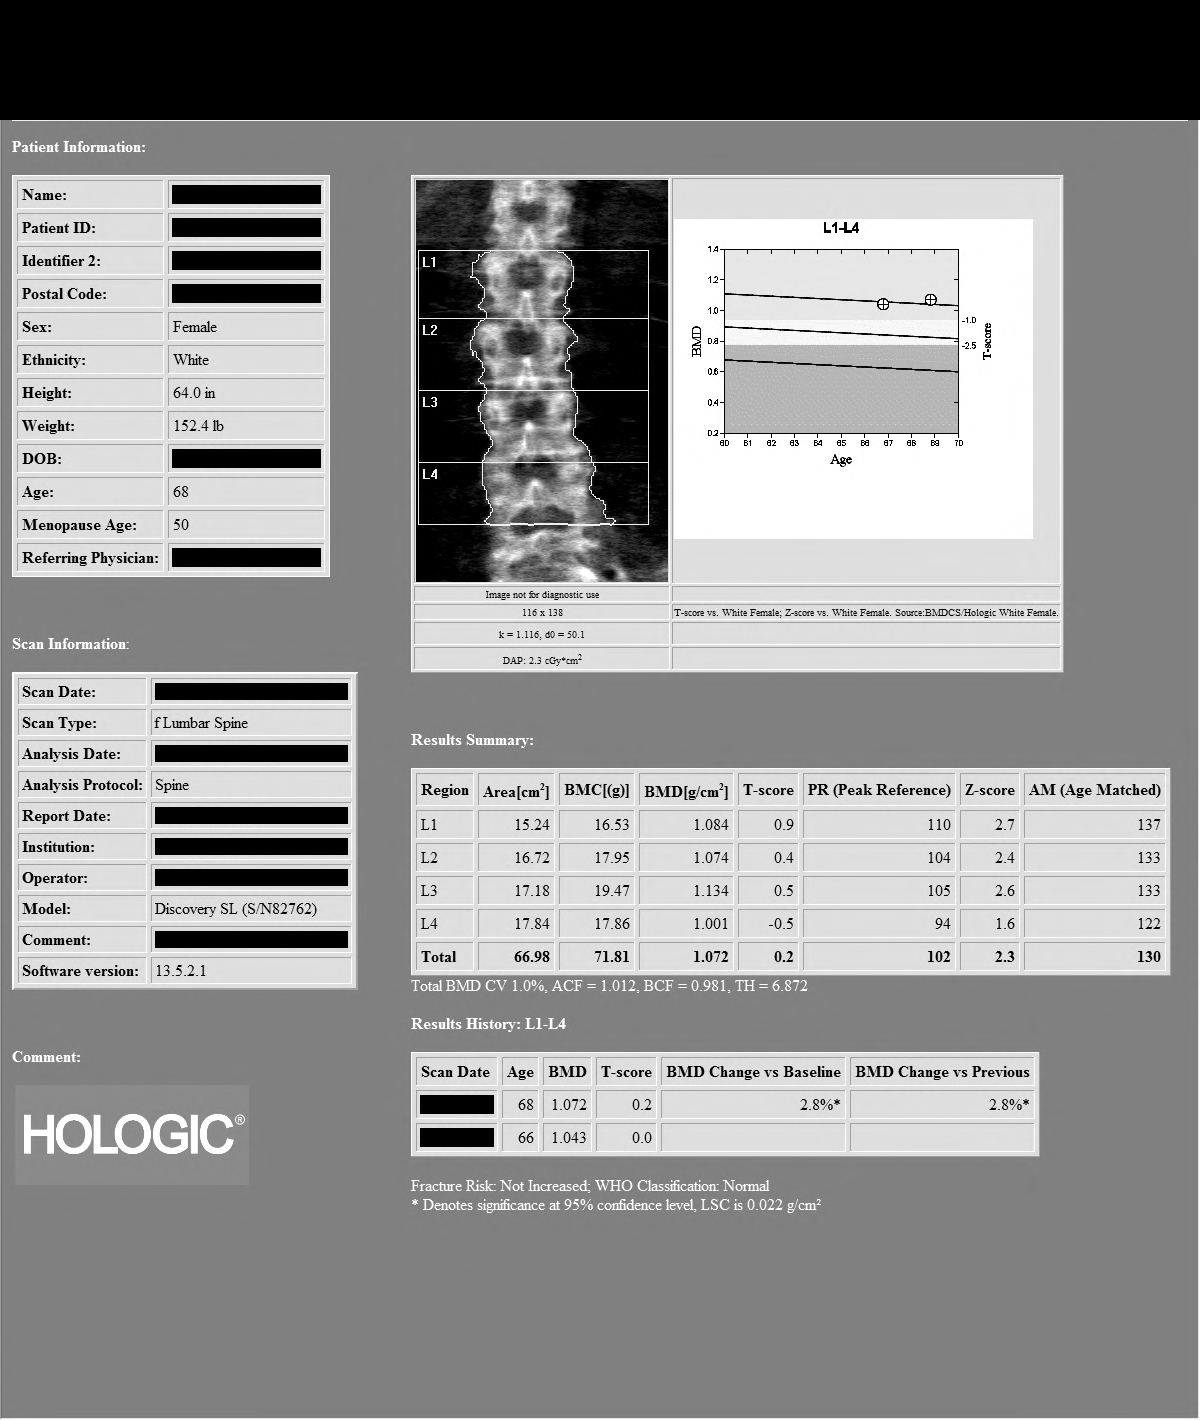

[Series 2: — · left · 1 of 1 slices shown (2 of 2)]
[im 1/1]
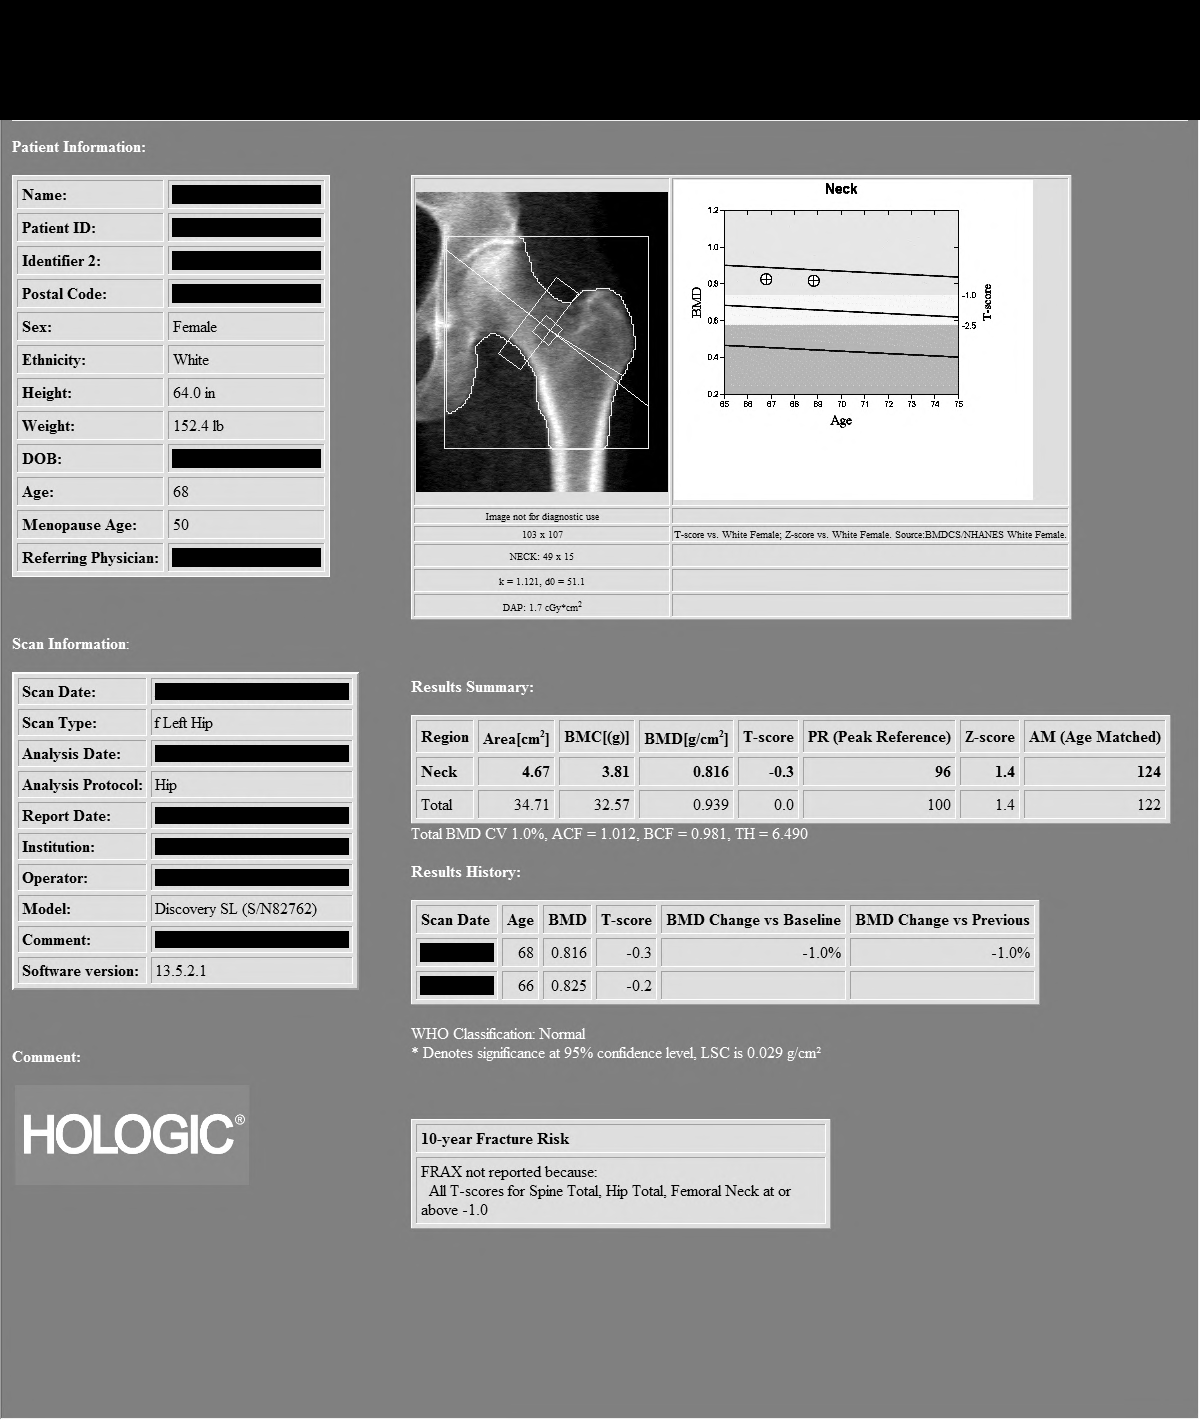

[2 of 2 positions shown; findings below may reference images not displayed]

IMPRESSION: As defined by World Health Organization, the patient meets the criteria for NORMAL BONE DENSITY based on spine and left hip T-scores.

PATIENT DEMOGRAPHICS:  66-year-old white  female.
FINDINGS: 1.    Review of scanogram images shows no factor invalidating scan results.  

2.    The lumbar spine exam using L1-L4 regions shows average Bone Mineral Density is 1.072 gm/cm2 of Hydroxyapatite.  The T-score (comparing patient with a young adult group) is 0.2 standard deviations ABOVE mean. The Z-score (comparing patient with an age-matched group) is 2.3 standard deviations ABOVE mean.

COMPARED TO PRIOR DXA, spine bone density was 1.043 gm/cm2.  This is an interval increase of 0.029 gm/cm2 or 2.8 %. Technologist least significant change in the spine is 0.030 gm/cm2. This is not a statistically significant interval increase.

3.  The left hip exam using femoral neck region of interest shows average Bone Mineral Density is 0.816 gm/cm2 of Hydroxyapatite. The T-score (comparing patient with a young adult group) is 0.3 standard deviations BELOW mean. The Z-score (comparing patient with an age-matched group) is 1.4 standard deviations ABOVE mean.

COMPARED TO PRIOR DXA, femoral neck bone density was 0.825 gm/cm2.  This is an interval decrease of 0.009 gm/cm2 or -1.0 %. Technologist least significant change in the hip is 0.032 gm/cm2. This is not a statistically significant interval decrease.

RECOMMENDATIONS:  The patient states that she is not taking supplements on a regular basis.  The patient should continue being a nonsmoker and regular exercise to patient tolerance would be of benefit.  The patient is currently not taking prescribed medication for prevention of bone loss.  According to new criteria established by the National Osteoporosis Foundation, the patient DOES NOT meet the current indications for prescribed medical therapy.  The National Osteoporosis Foundation now recommends followup DXA scanning every two years in patients at risk regardless of whether the patient is undergoing pharmacological treatment.

World Health Organization criteria for diagnosis, please see link below.

[URL]

## 2017-09-13 IMAGING — MG MAMMO SCRN BIL W/CAD TOMO
8 series · 8 of 24 positions shown · non-contrast
Comparison: none

Images Obtained from [facility]
HISTORY: Patient is 66 years old and is seen for screening. The patient has no personal history of cancer. The patient has no family history of breast cancer.
FILMS COMPARED:
The present examination has been compared to prior imaging studies.
TECHNIQUE: Bilateral 2-D digital screening mammogram was performed followed by 3-D tomosynthesis.  Current study was also evaluated with a computer aided detection (CAD) system.
MAMMOGRAM FINDINGS:
The breasts are almost entirely fatty.
No suspicious abnormality is seen in either breast.  There are no significant changes from the prior study.

[R CC]
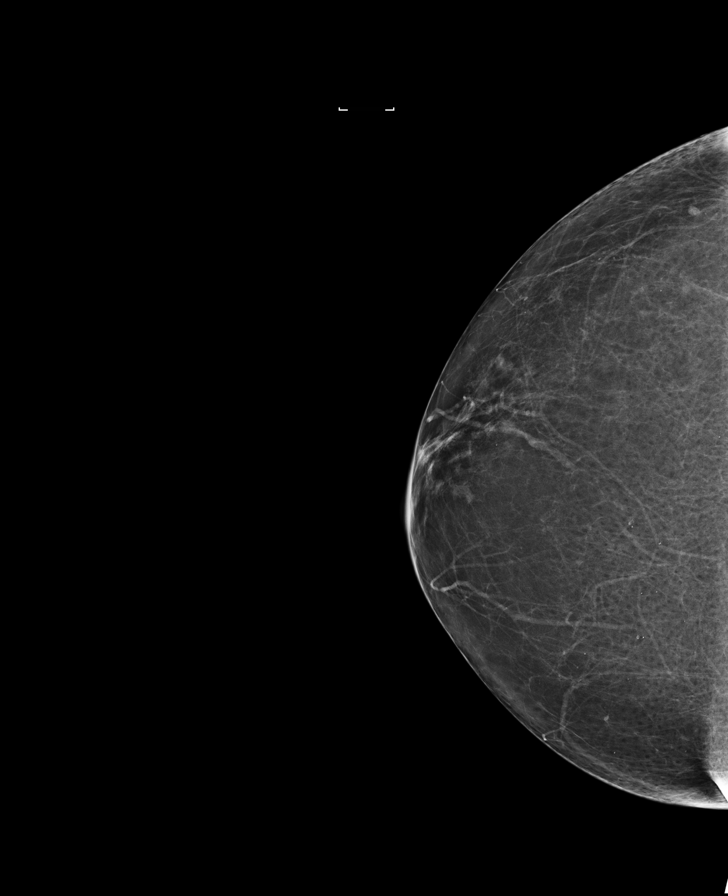

[L MLO]
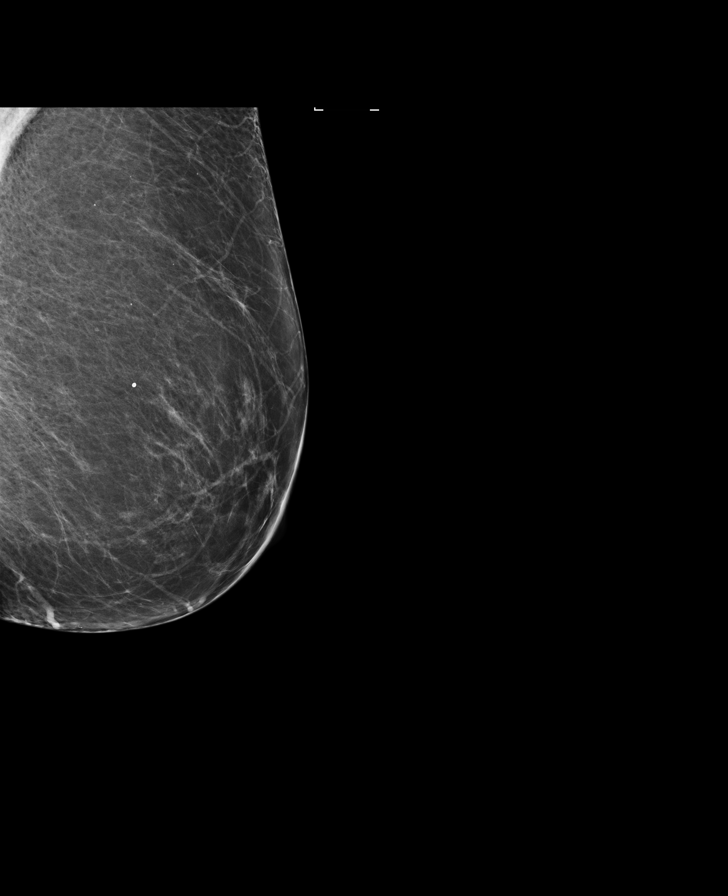

[R MLO]
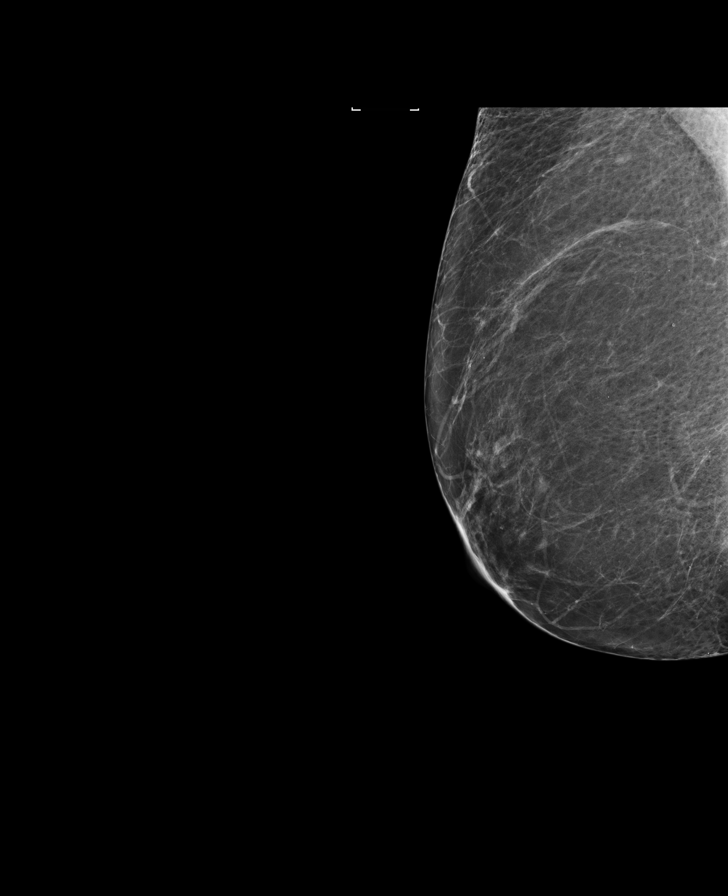

[L CC]
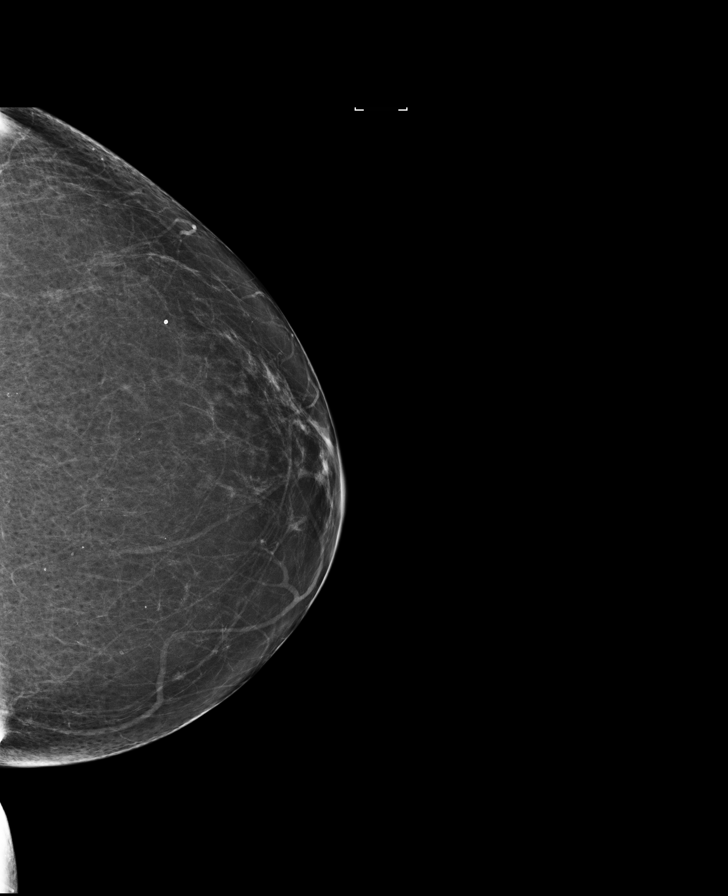

[L CC tomo · tomo slice 41/82.0]
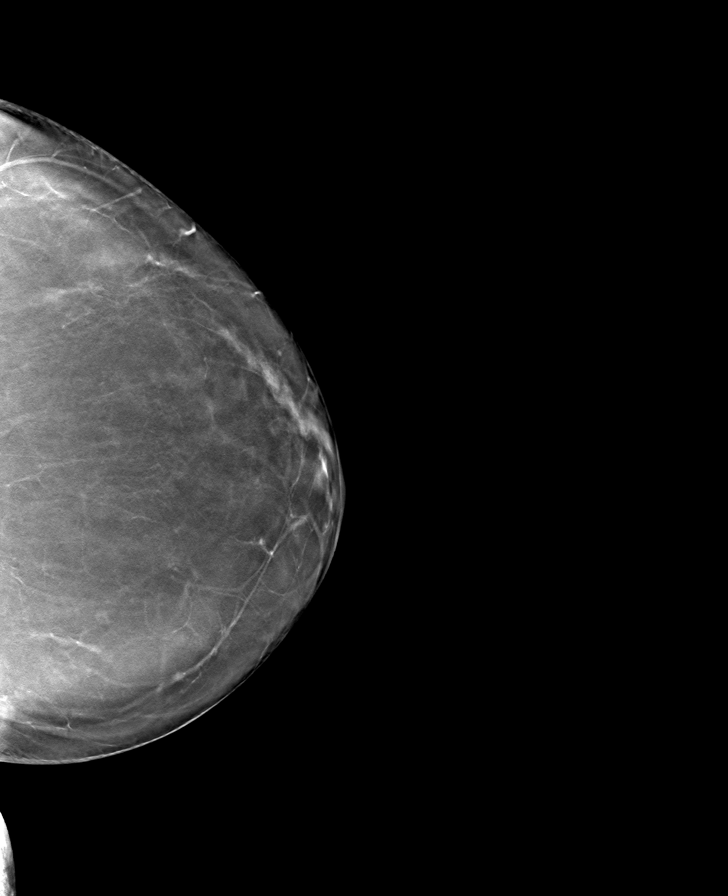

[R MLO tomo · tomo slice 43/85.0]
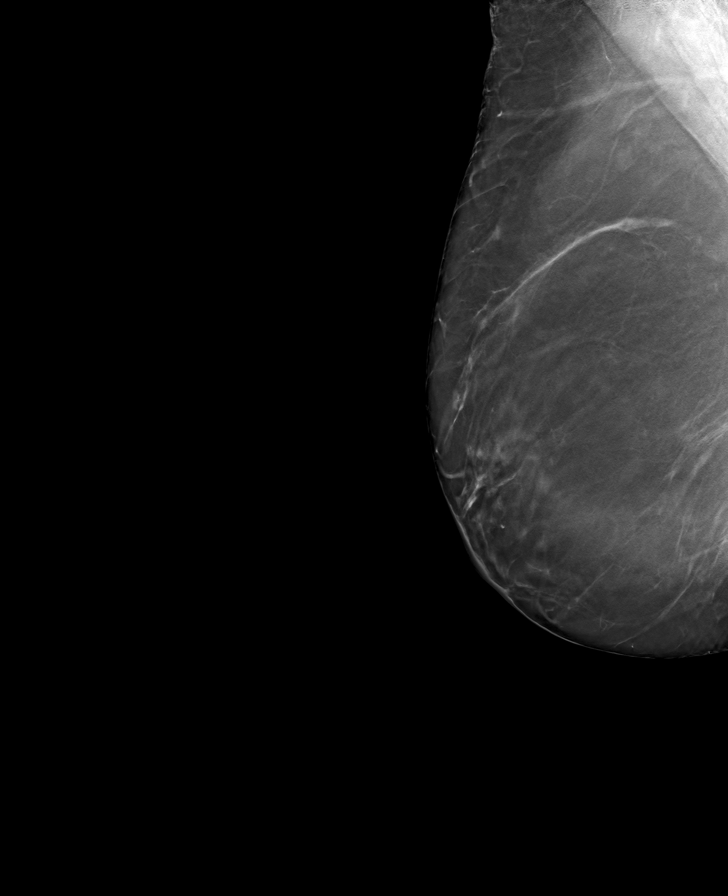

[R CC tomo · tomo slice 39/78.0]
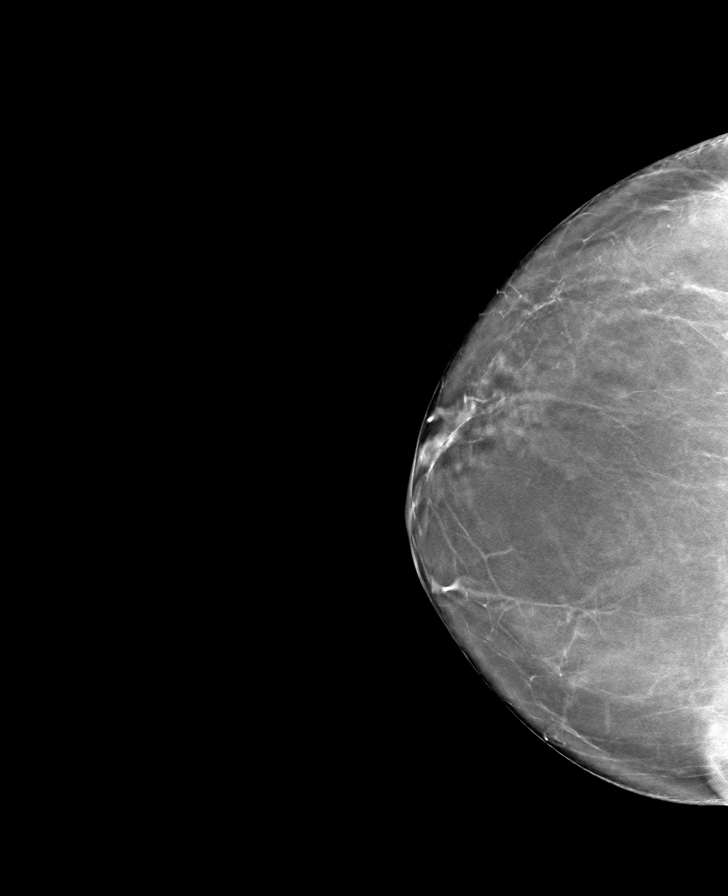

[L MLO tomo · tomo slice 45/88.0]
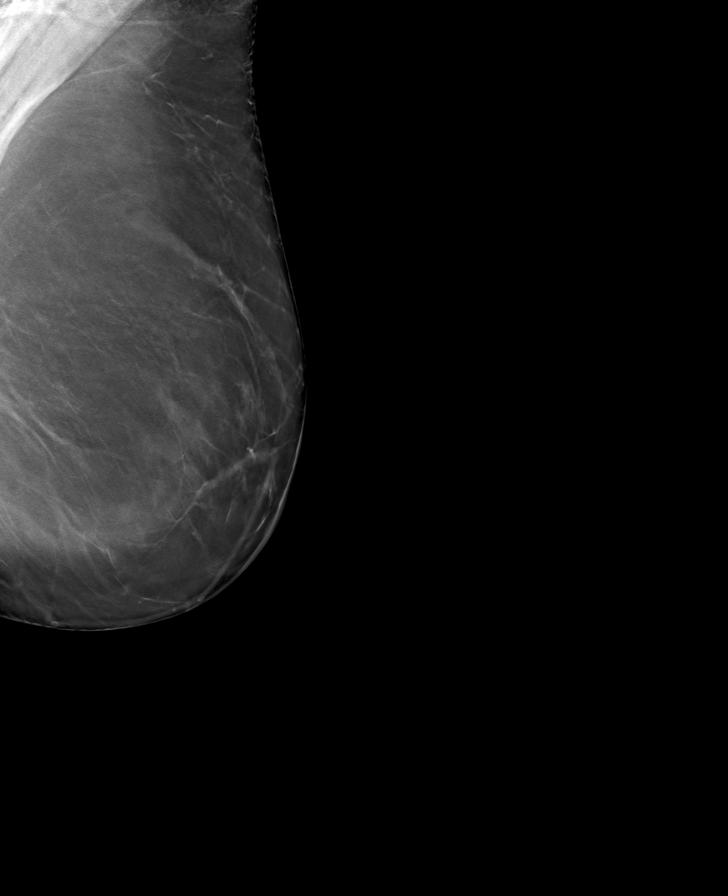

[8 of 24 positions shown; findings below may reference images not displayed]

IMPRESSION: There is no mammographic evidence of malignancy.
Screening mammogram recommended in 1 year.
BI-RADS Category 1: Negative

## 2017-09-19 ENCOUNTER — Telehealth

## 2017-09-19 MED ORDER — NITROFURANTOIN MONOHYD MACRO 100 MG PO CAPS
100 MG | ORAL_CAPSULE | Freq: Two times a day (BID) | ORAL | 0 refills | Status: AC
Start: 2017-09-19 — End: 2017-09-24

## 2017-09-19 NOTE — Telephone Encounter (Signed)
Start Macrobid empirically. Script sent.

## 2017-09-19 NOTE — Telephone Encounter (Signed)
The patient daughter Missy on HIPPA was advised of the message from [place] CNP. To start Macrobid after the urine specimen is sent to the lab. She voiced understanding.

## 2017-09-19 NOTE — Telephone Encounter (Addendum)
She is taking D-Mannose and myrbertriq. I placed an order and faxed it to [facility] [phone]. Please advise. Thank you.

## 2017-09-19 NOTE — Telephone Encounter (Signed)
Patients husband is calling.  He says [facility] closed at 230.  They are going to dr chalasani's office.

## 2017-09-19 NOTE — Telephone Encounter (Signed)
Patients husband Deitra Mayo (on hippa) calling in for patient, she thinks she may be getting a uti. Her only symptom is burning with urination, she denied fever, frequency, pain, discolored urine, no other symptoms, and it just started this morning. Their lab would be [facility] in [facility] at Dr [facility] office, and the pharmacy is [facility] in [facility]. Please call them to advise.

## 2017-09-19 NOTE — Telephone Encounter (Addendum)
Order faxed to Dr Arvil [facility]aco office [phone]. The patient daughter Missy on HIPPA was advised the order was faxed.

## 2017-09-21 LAB — URINE WITH REFLEXED MICRO
Glucose: NEGATIVE mg/dL
Ketones, Urine: NEGATIVE mg/dL
Nitrite, Urine: NEGATIVE
Protein, Urine: 100 mg/dL — AB
RBC: 46 /hpf — ABNORMAL HIGH (ref 0–5)
Spec Grav, Fluid: 1.01 (ref 1.003–1.03)
Total Bilirubin: NEGATIVE
Urobilinogen, Urine: 0.2 eu/dL (ref <1.1)
WBC: >720 /hpf — ABNORMAL HIGH (ref 0–5)
pH: 6 (ref 5.0–8.5)

## 2017-09-21 NOTE — Telephone Encounter (Signed)
Prior Auth initiated for Nitrofurantoin .  PA sent to covermymeds.  Should receive response in 3-5 days.    PA Case [id number] Status: Approved. Authorization and Notifications Completed.

## 2017-09-23 LAB — CULTURE, URINE

## 2018-02-28 ENCOUNTER — Ambulatory Visit: Admit: 2018-02-28 | Discharge: 2018-02-28 | Payer: MEDICARE | Attending: Registered Nurse | Primary: Family Medicine

## 2018-02-28 DIAGNOSIS — N3281 Overactive bladder: Secondary | ICD-10-CM

## 2018-02-28 LAB — POCT URINALYSIS DIPSTICK W/O MICROSCOPE (AUTO)
Bilirubin, UA: NEGATIVE
Glucose, UA POC: NEGATIVE
Ketones, UA: NEGATIVE
Nitrite, UA: NEGATIVE
Protein, UA POC: 30
Spec Grav, UA: >=1.03
Urobilinogen, UA: 0.2
pH, UA: 5.5

## 2018-02-28 LAB — POST VOID RESIDUAL (PVR): post void residual: 20 ml

## 2018-02-28 NOTE — Patient Instructions (Addendum)
You may receive a survey regarding the care you received during your visit.  Your input is valuable to us.  We encourage you to complete and return your survey.  We hope you will choose us in the future for your healthcare needs.      Foods and drinks that can be irritating to the bladder and contribute to pain:

## 2018-02-28 NOTE — Progress Notes (Signed)
[facility]  [facility]  [address] [name]  [place] [address]  Dept: [phone]  Loc: [phone]    Visit Date: 02/28/2018        HPI:     Michelle Bright is a 68 y.o. female who presents today for:  Chief Complaint   Patient presents with   ??? Follow-up   ??? Urinary Frequency       HPI   Pt seen in follow up for recurrent UTIs and OAB.     Pt has a hx of recurrent UTIs previously treated with D mannose.  Pt previously tried oxybutynin and Myrbetriq for her OAB without improvement.      Pt reports she recently had a UTI with dysuria, frequency, and urgency her PCP treated.  She continues on Cipro at this time.  She notes the burning is resolved.  Still with some frequency.  Wakes 3 x per night.  Not interested in medication at this time.    Current Outpatient Medications   Medication Sig Dispense Refill   ??? lisinopril (PRINIVIL;ZESTRIL) 20 MG tablet Take 20 mg by mouth daily     ??? metoprolol tartrate (LOPRESSOR) 50 MG tablet Take 50 mg by mouth 2 times daily     ??? ciprofloxacin (CIPRO) 500 MG tablet Take 500 mg by mouth 2 times daily     ??? NONFORMULARY D Mannose     ??? atorvastatin (LIPITOR) 40 MG tablet Take 40 mg by mouth daily     ??? warfarin (COUMADIN) 2 MG tablet Take 3 mg by mouth daily      ??? levothyroxine (SYNTHROID) 100 MCG tablet Take 100 mcg by mouth Daily      ??? aspirin 81 MG tablet Take 81 mg by mouth daily     ??? flecainide (TAMBOCOR) 100 MG tablet Take 50 mg by mouth 2 times daily     ??? nitroGLYCERIN (NITROSTAT) 0.4 MG SL tablet up to max of 3 total doses. If no relief after 1 dose, call 911. (Patient not taking: Reported on 08/31/2017) 25 tablet 3   ??? acetaminophen (TYLENOL) 500 MG tablet Take 500 mg by mouth every 6 hours as needed for Pain     ??? IRON PO Take 65 mg by mouth 2 times daily     ??? Calcium Carb-Cholecalciferol (CALCIUM + D3 PO) Take 1,200 mg by mouth 2 times daily       No current facility-administered medications for this visit.        Past  Medical History  Michelle Bright  has a past medical history of Arthritis, Atrial fibrillation (HCC), CAD (coronary artery disease), Hypertension, Osteoporosis, and Thyroid disease.    Past Surgical History  The patient  has a past surgical history that includes Femur Surgery (12/2014); Thyroid surgery (2005); and fracture surgery.    Family History  This patient's family history is not on file.    Social History  Michelle Bright  reports that she quit smoking about 25 years ago. She has never used smokeless tobacco.      Subjective:      Review of Systems   Constitutional: Negative for activity change, appetite change, chills, diaphoresis, fatigue, fever and unexpected weight change.   Gastrointestinal: Negative for abdominal pain.   Genitourinary: Negative for difficulty urinating, dyspareunia, dysuria, hematuria and urgency.   Musculoskeletal: Negative for back pain.       Objective:   BP 128/84    Ht 4'  11" (1.499 m)    Wt 114 lb (51.7 kg)    BMI 23.03 kg/m??     Physical Exam  Constitutional:       General: She is not in acute distress.     Appearance: Normal appearance. She is not ill-appearing or diaphoretic.   HENT:      Head: Normocephalic and atraumatic.   Eyes:      General: No scleral icterus.        Right eye: No discharge.         Left eye: No discharge.   Cardiovascular:      Rate and Rhythm: Normal rate and regular rhythm.   Pulmonary:      Effort: No respiratory distress.   Abdominal:      General: There is no distension.      Tenderness: There is no abdominal tenderness.   Skin:     General: Skin is warm and dry.      Capillary Refill: Capillary refill takes less than 2 seconds.   Neurological:      General: No focal deficit present.      Mental Status: She is alert.   Psychiatric:         Mood and Affect: Mood normal.         Behavior: Behavior normal.         POC  No results found for this visit on 02/28/18.    Patients recent PSA values are as follows  No results found for: PSA, PSADIA     Recent  BUN/Creatinine:  Lab Results   Component Value Date    BUN 38 04/11/2015    CREATININE 1.0 04/11/2015       Assessment:   Recurrent UTI  OAB    Plan:     Pt had recent UTI however prior to that UTIs fairly well controlled.  Reports she is taking the D mannose 500 mg TID.  Discussed increasing fluid intake and that she could increase the D mannose to 1000 mg TID.  Pt is not interested in further medication for OAB at today's visit.  She will continue Cipro and we will have her call if any further infections.  If infections continue will trial Hiprex.      F/u in 6 months with PVR.

## 2018-02-28 NOTE — Addendum Note (Signed)
Addended by: Margarette Asal A on: 02/28/2018 10:05 AM     Modules accepted: Orders, Level of Service

## 2018-03-08 ENCOUNTER — Encounter: Attending: Family | Primary: Family Medicine

## 2018-08-29 ENCOUNTER — Ambulatory Visit: Admit: 2018-08-29 | Discharge: 2018-08-29 | Payer: MEDICARE | Attending: Registered Nurse | Primary: Family Medicine

## 2018-08-29 DIAGNOSIS — R300 Dysuria: Secondary | ICD-10-CM

## 2018-08-29 LAB — POCT URINALYSIS DIPSTICK W/O MICROSCOPE (AUTO)
Bilirubin, UA: NEGATIVE
Glucose, UA POC: NEGATIVE
Ketones, UA: NEGATIVE
Nitrite, UA: NEGATIVE
Protein, UA POC: NEGATIVE
Spec Grav, UA: 1.02
Urobilinogen, UA: 0.2
pH, UA: 5

## 2018-08-29 LAB — POST VOID RESIDUAL (PVR): post void residual: 9 ml

## 2018-08-29 MED ORDER — NITROFURANTOIN MONOHYD MACRO 100 MG PO CAPS
100 MG | ORAL_CAPSULE | Freq: Two times a day (BID) | ORAL | 0 refills | Status: AC
Start: 2018-08-29 — End: 2018-09-03

## 2018-08-29 NOTE — Progress Notes (Signed)
[place]  [place] [address]  Dept: [phone]  Loc: [phone]    Visit Date: 08/29/2018        HPI:     Michelle Bright is a 68 y.o. female who presents today for:  Chief Complaint   Patient presents with   ??? Follow-up     OAB   ??? Urinary Frequency       HPI   Pt seen in follow up for recurrent UTIs and OAB.     Pt has a hx of recurrent UTIs previously treated with D mannose.  Pt previously tried oxybutynin and Myrbetriq for her OAB without improvement.      Pt reports increased urinary frequency, urgency, and dysuria since Saturday of this week.  Reports burning was better Tuesday but is back again today.  She is worried she may have an infection.  Reports otherwise no other infections since prior to last visit.     Pt continues on D mannose TID.  She wakes 3-4 x per night but it ok with that and does not wish to trial any treatments at this time for her nocturia and at times OAB.      Current Outpatient Medications   Medication Sig Dispense Refill   ??? metoprolol succinate (TOPROL XL) 50 MG extended release tablet Take 50 mg by mouth daily     ??? furosemide (LASIX) 20 MG tablet 20 mg Patient taking 1/2 tablet to 1 tablet daily     ??? potassium chloride (KLOR-CON) 10 MEQ extended release tablet      ??? nitrofurantoin, macrocrystal-monohydrate, (MACROBID) 100 MG capsule Take 1 capsule by mouth 2 times daily for 5 days 10 capsule 0   ??? lisinopril (PRINIVIL;ZESTRIL) 20 MG tablet Take 20 mg by mouth daily     ??? NONFORMULARY D Mannose     ??? atorvastatin (LIPITOR) 40 MG tablet Take 40 mg by mouth daily     ??? acetaminophen (TYLENOL) 500 MG tablet Take 500 mg by mouth every 6 hours as needed for Pain     ??? warfarin (COUMADIN) 3 MG tablet Take 3 mg by mouth daily      ??? levothyroxine (SYNTHROID) 100 MCG tablet Take 100 mcg by mouth Daily      ??? aspirin 81 MG tablet Take 81 mg by mouth daily     ??? IRON PO Take 65 mg by mouth 2 times daily     ???  Calcium Carb-Cholecalciferol (CALCIUM + D3 PO) Take 1,200 mg by mouth 2 times daily     ??? flecainide (TAMBOCOR) 100 MG tablet Take 50 mg by mouth 2 times daily     ??? nitroGLYCERIN (NITROSTAT) 0.4 MG SL tablet up to max of 3 total doses. If no relief after 1 dose, call 911. (Patient not taking: Reported on 08/31/2017) 25 tablet 3     No current facility-administered medications for this visit.        Past Medical History  Michelle Bright  has a past medical history of Arthritis, Atrial fibrillation ([place]), CAD (coronary artery disease), Hypertension, Osteoporosis, and Thyroid disease.    Past Surgical History  The patient  has a past surgical history that includes Femur Surgery (12/2014); Thyroid surgery (2005); and fracture surgery.    Family History  This patient's family history is not on file.    Social History  Michelle Bright  reports that she quit smoking about  26 years ago. She has never used smokeless tobacco.      Subjective:      Review of Systems   Constitutional: Negative for activity change, appetite change, chills, diaphoresis, fatigue, fever and unexpected weight change.   Gastrointestinal: Negative for abdominal pain.   Genitourinary: Negative for difficulty urinating, dyspareunia, dysuria, hematuria and urgency.   Musculoskeletal: Negative for back pain.       Objective:   Ht 4\' 11"  (1.499 m)    Wt 106 lb (48.1 kg)    BMI 21.41 kg/m??     Physical Exam  Constitutional:       General: She is not in acute distress.     Appearance: Normal appearance. She is not ill-appearing or diaphoretic.   HENT:      Head: Normocephalic and atraumatic.   Eyes:      General: No scleral icterus.        Right eye: No discharge.         Left eye: No discharge.   Cardiovascular:      Rate and Rhythm: Normal rate and regular rhythm.   Pulmonary:      Effort: No respiratory distress.   Abdominal:      General: There is no distension.      Tenderness: There is no abdominal tenderness.   Skin:     General: Skin is warm and dry.      Capillary  Refill: Capillary refill takes less than 2 seconds.   Neurological:      General: No focal deficit present.      Mental Status: She is alert.   Psychiatric:         Mood and Affect: Mood normal.         Behavior: Behavior normal.         POC  Results for POC orders placed in visit on 08/29/18   POCT Urinalysis No Micro (Auto)   Result Value Ref Range    Color, UA yellow     Clarity, UA clear     Glucose, UA POC neg     Bilirubin, UA neg     Ketones, UA neg     Spec Grav, UA 1.020     Blood, UA POC moderate     pH, UA 5.0     Protein, UA POC neg     Urobilinogen, UA 0.2     Leukocytes, UA moderate     Nitrite, UA neg    poct post void residual   Result Value Ref Range    post void residual 9 ml       Patients recent PSA values are as follows  No results found for: PSA, PSADIA     Recent BUN/Creatinine:  Lab Results   Component Value Date    BUN 38 04/11/2015    CREATININE 1.0 04/11/2015       Assessment:   Recurrent UTI  OAB    Plan:     Pt's urine today with moderate blood and leuks.  Send for culture and start Macrobid empirically.  Call final culture results.      Continue D mannose TID.  Trial increasing oral fluid intake.  Pt and husband report she has trouble getting enough fluids in.      F/u in 1 year with PVR.

## 2018-08-31 NOTE — Telephone Encounter (Signed)
Pt's urine culture with mixed growth.  Please notify pt to continue macrobid to completion.

## 2018-09-03 NOTE — Telephone Encounter (Signed)
Attempted to call the patient. Received no answer.

## 2018-12-18 ENCOUNTER — Encounter

## 2018-12-18 ENCOUNTER — Inpatient Hospital Stay: Admit: 2018-12-18 | Payer: MEDICARE | Primary: Family Medicine

## 2018-12-18 DIAGNOSIS — Z1231 Encounter for screening mammogram for malignant neoplasm of breast: Secondary | ICD-10-CM

## 2019-08-28 ENCOUNTER — Encounter: Attending: Registered Nurse | Primary: Family Medicine

## 2019-09-04 ENCOUNTER — Ambulatory Visit: Admit: 2019-09-04 | Discharge: 2019-09-04 | Payer: MEDICARE | Attending: Registered Nurse | Primary: Family Medicine

## 2019-09-04 DIAGNOSIS — R300 Dysuria: Secondary | ICD-10-CM

## 2019-09-04 LAB — POCT URINALYSIS DIPSTICK: Leukocytes, UA: POSITIVE

## 2019-09-04 LAB — POST VOID RESIDUAL (PVR): post void residual: 68 ml

## 2019-09-04 MED ORDER — NITROFURANTOIN MONOHYD MACRO 100 MG PO CAPS
100 | ORAL_CAPSULE | Freq: Two times a day (BID) | ORAL | 0 refills | Status: AC
Start: 2019-09-04 — End: 2019-09-11

## 2019-09-04 MED ORDER — TROSPIUM CHLORIDE 20 MG PO TABS
20 | ORAL_TABLET | Freq: Two times a day (BID) | ORAL | 3 refills | Status: DC
Start: 2019-09-04 — End: 2019-09-26

## 2019-09-04 NOTE — Progress Notes (Signed)
Patient was straight catheterized for specimen.

## 2019-09-04 NOTE — Progress Notes (Signed)
[facility] [place] [facility]  [address]  [place] [address]  Dept: [phone]  Loc: [phone]    Visit Date: 09/04/2019        HPI:     Michelle Bright is a 69 y.o. female who presents today for:  Chief Complaint   Patient presents with   ??? 1 Year Follow Up     OAB   ??? Urinary Tract Infection     recurrent    ??? Dysuria       HPI   Pt seen in follow up for recurrent UTIs and OAB.   ??  Pt has a hx of recurrent UTIs previously treated with D mannose.  Pt previously tried oxybutynin and Myrbetriq for her OAB without improvement.      Currently she continues on D mannose TID.      Reports having stinging in the urethra with urination.  Started last night.  No fever/chills.  No hematuria.  Nocturia 4 x per night.  Urinary frequency and urgency during the daytime as well chronically.  Interested in medication therapy for her OAB.      Current Outpatient Medications   Medication Sig Dispense Refill   ??? dilTIAZem (CARDIZEM) 30 MG tablet Take 30 mg by mouth 2 times daily     ??? Ascorbic Acid (VITAMIN C) 500 MG CHEW Take 500 mg by mouth daily     ??? metoprolol succinate (TOPROL XL) 50 MG extended release tablet Take 50 mg by mouth daily     ??? furosemide (LASIX) 20 MG tablet 20 mg Patient taking 1/2 tablet to 1 tablet daily     ??? potassium chloride (KLOR-CON) 10 MEQ extended release tablet      ??? lisinopril (PRINIVIL;ZESTRIL) 20 MG tablet Take 20 mg by mouth daily     ??? NONFORMULARY D Mannose     ??? atorvastatin (LIPITOR) 40 MG tablet Take 40 mg by mouth daily     ??? nitroGLYCERIN (NITROSTAT) 0.4 MG SL tablet up to max of 3 total doses. If no relief after 1 dose, call 911. 25 tablet 3   ??? acetaminophen (TYLENOL) 500 MG tablet Take 500 mg by mouth every 6 hours as needed for Pain     ??? warfarin (COUMADIN) 3 MG tablet Take 3 mg by mouth daily      ??? levothyroxine (SYNTHROID) 100 MCG tablet Take 100 mcg by mouth Daily      ??? aspirin 81 MG tablet Take 81 mg by mouth daily     ??? IRON  PO Take 65 mg by mouth 2 times daily     ??? Calcium Carb-Cholecalciferol (CALCIUM + D3 PO) Take 1,200 mg by mouth 2 times daily     ??? flecainide (TAMBOCOR) 100 MG tablet Take 50 mg by mouth 2 times daily       No current facility-administered medications for this visit.       Past Medical History  Michelle Bright  has a past medical history of Arthritis, Atrial fibrillation (HCC), CAD (coronary artery disease), Hypertension, Osteoporosis, and Thyroid disease.    Past Surgical History  The patient  has a past surgical history that includes Femur Surgery (12/2014); Thyroid surgery (2005); and fracture surgery.    Family History  This patient's family history is not on file.    Social History  Michelle Bright  reports that she quit smoking about 27 years ago. She has never used smokeless tobacco.  Subjective:      Review of Systems   Constitutional: Negative for activity change, appetite change, chills, diaphoresis, fatigue, fever and unexpected weight change.   Gastrointestinal: Negative for abdominal pain, nausea and vomiting.   Genitourinary: Positive for dysuria, frequency and urgency. Negative for decreased urine volume, difficulty urinating, flank pain and hematuria.        Nocturia 4+ x per night   Musculoskeletal: Negative for back pain.       Objective:   BP 138/88    Ht 4\' 11"  (1.499 m)    Wt 103 lb (46.7 kg)    BMI 20.80 kg/m??     Physical Exam  Vitals reviewed.   Constitutional:       General: She is not in acute distress.     Appearance: Normal appearance. She is well-developed. She is not ill-appearing or diaphoretic.   HENT:      Head: Normocephalic and atraumatic.      Right Ear: External ear normal.      Left Ear: External ear normal.      Nose: Nose normal.      Mouth/Throat:      Mouth: Mucous membranes are moist.   Eyes:      General: No scleral icterus.        Right eye: No discharge.         Left eye: No discharge.   Neck:      Vascular: No JVD.      Trachea: No tracheal deviation.   Pulmonary:      Effort:  Pulmonary effort is normal. No respiratory distress.   Abdominal:      General: There is no distension.      Tenderness: There is no abdominal tenderness. There is no right CVA tenderness or left CVA tenderness.   Musculoskeletal:         General: No tenderness. Normal range of motion.   Neurological:      Mental Status: She is alert and oriented to person, place, and time. Mental status is at baseline.   Psychiatric:         Mood and Affect: Mood normal.         Behavior: Behavior normal.         Thought Content: Thought content normal.         POC  Results for POC orders placed in visit on 09/04/19   poct post void residual   Result Value Ref Range    post void residual 68 ml         Patients recent PSA values are as follows  No results found for: PSA, PSADIA     Recent BUN/Creatinine:  Lab Results   Component Value Date    BUN 38 04/11/2015    CREATININE 1.0 04/11/2015         Assessment:   Recurrent UTIs with probable acute cystitis  Nocturia  OAB  Plan:     Start Macrobid empirically for probable uti.  Send urine for culture.  Increase oral fluid intake.      Continue D mannose.     Trial Trospium for OAB and nocturia.     F/u in 6-8 weeks with PVR.

## 2019-09-05 LAB — CULTURE, URINE

## 2019-09-06 ENCOUNTER — Telehealth

## 2019-09-06 NOTE — Telephone Encounter (Signed)
Patient husband advised of the urine results and take the Macrobid until completed. He voiced understanding and will call the office if her symptoms worsen or do not improve.

## 2019-09-06 NOTE — Telephone Encounter (Signed)
Please let pt know her urine culture was significant for infection sensitive to the antibiotic prescribed.  Continue Macrobid to completion and call the office if symptoms worsen or fail to improve.

## 2019-09-26 MED ORDER — SOLIFENACIN SUCCINATE 10 MG PO TABS
10 MG | ORAL_TABLET | Freq: Every day | ORAL | 2 refills | Status: DC
Start: 2019-09-26 — End: 2019-12-03

## 2019-09-26 NOTE — Telephone Encounter (Signed)
Patient husband advised prescription was sent to the pharmacy. He voiced understanding.

## 2019-09-26 NOTE — Addendum Note (Signed)
Addended by: Margarette Asal A on: 09/26/2019 03:06 PM     Modules accepted: Orders

## 2019-09-26 NOTE — Telephone Encounter (Signed)
Attempted to call the patient. Received no answer.

## 2019-09-26 NOTE — Telephone Encounter (Signed)
Check urine culture.      Pt has previously tried oxybutynin and Myrbetriq for her OAB symptoms.  If the Trospium is not helping she can stop it and we can discuss possible advanced therapies for OAB at her next appt.  If she wishes to try an additional medication in the meantime we can send in a script for Vesicare.

## 2019-09-26 NOTE — Telephone Encounter (Signed)
Patient husband stated she finished the Macrobid and the trospium 20 mg BID is not helping for the frequency. She has nocturia (up 5 times per night) and urinates every 20 minutes during the day.    She denies burning, fever, or chills.    Please advise. Thank you.

## 2019-09-26 NOTE — Addendum Note (Signed)
Addended by: Leanor Rubenstein on: 09/26/2019 02:20 PM     Modules accepted: Orders

## 2019-09-26 NOTE — Telephone Encounter (Signed)
Patient husband stated they would like to trial a 30 day supply of the vesicare and they will stop the trospium.     Order faxed to [facility]. They will give the specimen today or tomorrow to the lab.    Thank you.

## 2019-09-28 LAB — CULTURE, URINE

## 2019-09-30 NOTE — Telephone Encounter (Signed)
Urine culture with normal flora not consistent with infection at this time.

## 2019-10-01 NOTE — Telephone Encounter (Signed)
Patient advised of the urine culture results. She voiced understanding.

## 2019-10-30 ENCOUNTER — Encounter: Attending: Registered Nurse | Primary: Family Medicine

## 2019-11-07 ENCOUNTER — Ambulatory Visit: Admit: 2019-11-07 | Discharge: 2019-11-07 | Payer: MEDICARE | Attending: Registered Nurse | Primary: Family Medicine

## 2019-11-07 DIAGNOSIS — R351 Nocturia: Secondary | ICD-10-CM

## 2019-11-07 LAB — POCT URINALYSIS DIPSTICK W/O MICROSCOPE (AUTO)
Bilirubin, UA: NEGATIVE
Glucose, UA POC: NEGATIVE
Ketones, UA: NEGATIVE
Nitrite, UA: NEGATIVE
Protein, UA POC: NEGATIVE
Spec Grav, UA: <=1.005
Urobilinogen, UA: 0.2
pH, UA: 6

## 2019-11-07 LAB — POST VOID RESIDUAL (PVR): post void residual: 57 ml

## 2019-11-07 NOTE — Progress Notes (Signed)
[facility] [place] [facility]  [address] [name]  [place] [address]  Dept: [phone]  Loc: [phone]    Visit Date: 11/07/2019        HPI:     Michelle Bright is a 69 y.o. female who presents today for:  Chief Complaint   Patient presents with   ??? Follow-up     OAB   ??? Urniary Frequency at Night     recurrent uti       HPI   Pt seen in follow up for recurrent UTIs and OAB.   ??  Pt has a hx of recurrent UTIs previously treated with D mannose. ??Pt previously tried oxybutynin, trospium, and Myrbetriq for her OAB without improvement. ??Currently on Vesicare 10 mg daily.  D mannose TID.    Reports urinary symptoms improved but still waking 5 x per night to urinate.  Denies symptoms of UTI today.??    Current Outpatient Medications   Medication Sig Dispense Refill   ??? solifenacin (VESICARE) 10 MG tablet Take 1 tablet by mouth daily 30 tablet 2   ??? dilTIAZem (CARDIZEM) 30 MG tablet Take 30 mg by mouth 2 times daily     ??? Ascorbic Acid (VITAMIN C) 500 MG CHEW Take 500 mg by mouth daily     ??? metoprolol succinate (TOPROL XL) 50 MG extended release tablet Take 50 mg by mouth daily     ??? furosemide (LASIX) 20 MG tablet 20 mg Patient taking 1/2 tablet to 1 tablet daily     ??? potassium chloride (KLOR-CON) 10 MEQ extended release tablet      ??? lisinopril (PRINIVIL;ZESTRIL) 20 MG tablet Take 20 mg by mouth daily     ??? NONFORMULARY D Mannose     ??? atorvastatin (LIPITOR) 40 MG tablet Take 40 mg by mouth daily     ??? nitroGLYCERIN (NITROSTAT) 0.4 MG SL tablet up to max of 3 total doses. If no relief after 1 dose, call 911. 25 tablet 3   ??? acetaminophen (TYLENOL) 500 MG tablet Take 500 mg by mouth every 6 hours as needed for Pain     ??? warfarin (COUMADIN) 3 MG tablet Take 3 mg by mouth daily      ??? levothyroxine (SYNTHROID) 100 MCG tablet Take 100 mcg by mouth Daily      ??? aspirin 81 MG tablet Take 81 mg by mouth daily     ??? IRON PO Take 65 mg by mouth 2 times daily     ??? Calcium  Carb-Cholecalciferol (CALCIUM + D3 PO) Take 1,200 mg by mouth 2 times daily     ??? flecainide (TAMBOCOR) 100 MG tablet Take 50 mg by mouth 2 times daily       No current facility-administered medications for this visit.       Past Medical History  Michelle Bright  has a past medical history of Arthritis, Atrial fibrillation (HCC), CAD (coronary artery disease), Hypertension, Osteoporosis, and Thyroid disease.    Past Surgical History  The patient  has a past surgical history that includes Femur Surgery (12/2014); Thyroid surgery (2005); and fracture surgery.    Family History  This patient's family history is not on file.    Social History  Michelle Bright  reports that she quit smoking about 27 years ago. She has never used smokeless tobacco.      Subjective:      Review of Systems   Constitutional: Negative for  activity change, appetite change, chills, diaphoresis, fatigue, fever and unexpected weight change.   Gastrointestinal: Negative for abdominal pain, nausea and vomiting.   Genitourinary: Negative for decreased urine volume, difficulty urinating, dysuria, flank pain, frequency, hematuria and urgency.   Musculoskeletal: Negative for back pain.       Objective:   BP (!) 146/80    Ht 4\' 11"  (1.499 m)    Wt 102 lb (46.3 kg)    BMI 20.60 kg/m??     Physical Exam  Vitals reviewed.   Constitutional:       General: She is not in acute distress.     Appearance: Normal appearance. She is well-developed. She is not ill-appearing or diaphoretic.   HENT:      Head: Normocephalic and atraumatic.      Right Ear: External ear normal.      Left Ear: External ear normal.      Nose: Nose normal.      Mouth/Throat:      Mouth: Mucous membranes are moist.   Eyes:      General: No scleral icterus.        Right eye: No discharge.         Left eye: No discharge.   Neck:      Vascular: No JVD.      Trachea: No tracheal deviation.   Pulmonary:      Effort: Pulmonary effort is normal. No respiratory distress.   Abdominal:      General: There is no  distension.      Tenderness: There is no abdominal tenderness. There is no right CVA tenderness or left CVA tenderness.   Musculoskeletal:         General: No tenderness. Normal range of motion.   Neurological:      Mental Status: She is alert and oriented to person, place, and time. Mental status is at baseline.   Psychiatric:         Mood and Affect: Mood normal.         Behavior: Behavior normal.         Thought Content: Thought content normal.         POC  No results found for this visit on 11/07/19.      Patients recent PSA values are as follows  No results found for: PSA, PSADIA     Recent BUN/Creatinine:  Lab Results   Component Value Date    BUN 38 04/11/2015    CREATININE 1.0 04/11/2015       Assessment:   Recurrent UTIs   Nocturia  OAB    Plan:     Pt continues with OAB and nocturia despite trial of several medications.  Discussed advanced therapies for incontinence today and pt is interested in bladder botox injections.  We will set her up for an appt with one of the physicians to further discuss.

## 2019-11-07 NOTE — Addendum Note (Signed)
Addended by: Robbi Garter on: 11/07/2019 10:38 AM     Modules accepted: Orders

## 2019-11-13 MED ORDER — CEPHALEXIN 500 MG PO CAPS
500 MG | ORAL_CAPSULE | Freq: Four times a day (QID) | ORAL | 0 refills | Status: AC
Start: 2019-11-13 — End: 2019-11-20

## 2019-11-13 NOTE — Telephone Encounter (Signed)
Patient husband on [employer]IP[employer] advised of the urine results showed infection and keflex was sent to the pharmacy. He voiced understanding.

## 2019-11-13 NOTE — Telephone Encounter (Signed)
Pt's urine culture significant for infection. Antibiotic sent to pharmacy.  Please notify pt to pick up the antibiotic and take to completion.  Thank-you.

## 2019-12-03 ENCOUNTER — Ambulatory Visit: Admit: 2019-12-03 | Discharge: 2019-12-03 | Payer: MEDICARE | Attending: Urology | Primary: Family Medicine

## 2019-12-03 DIAGNOSIS — R351 Nocturia: Secondary | ICD-10-CM

## 2019-12-03 MED ORDER — SOLIFENACIN SUCCINATE 10 MG PO TABS
10 MG | ORAL_TABLET | Freq: Every day | ORAL | 2 refills | Status: DC
Start: 2019-12-03 — End: 2020-02-18

## 2019-12-03 NOTE — Progress Notes (Signed)
[facility] [place] [facility]  [facility]  [address]  [address]  [place] [address]  Dept: [phone]  Dept Fax: [phone]  Loc: [phone]    Mitsugi Schrader ALI Welton Flakes, MD  [facility] Office Note -    Patient:  Michelle Bright  Date of Birth: 30-Apr-1950      The patient is a 69 y.o. female who presents today for evaluation of the following problems:   Chief Complaint   Patient presents with   ??? Follow-up     discuss advanced therapy options- holly pt         HISTORY OF PRESENT ILLNESS:     oab  Sees holly  Worsening oab  Here to discuss third line therapies  Failed botox    Recurrent uti  > 3 infections in a year  Main symptom is dysuria    Summary of Previous Records:  Pt continues with OAB and nocturia despite trial of several medications.  Discussed advanced therapies for incontinence today and pt is interested in bladder botox injections.  We will set her up for an appt with one of the physicians to further discuss.        Requested/reviewed records from Elton Sin, MD office and/or outside physician/EMR    (Patient's old records have been requested, reviewed and pertinent findings summarized in today's note.)    Procedures Today: N/A    Last several PSA's:  No results found for: PSA    Last total testosterone:  No results found for: TESTOSTERONE    Urinalysis today:  No results found for this visit on 12/03/19.    Last BUN and creatinine:  Lab Results   Component Value Date    BUN 38 (H) 04/11/2015     Lab Results   Component Value Date    CREATININE 1.0 04/11/2015       Imaging Reviewed during this Office Visit:   Hildreth Orsak ALI Welton Flakes, MD independently reviewed the images and verified the radiology reports from:    [facility] DIGITAL SCREEN SELF REFERRAL W OR WO CAD BILATERAL    Result Date: 12/18/2018  THIS REPORT HAS BEEN AMENDED.  AMENDMENT: 02/10/2019   Alvester Morin, M.D. This examination has been overread.  I agree with the original report. Amended BI-RADS:  Category 2: Benign IMPRESSION: Mammogram BI-RADS: Category 2: Benign There is no mammographic evidence of malignancy. A 1 year screening mammogram is recommended. The patient has been entered into our database and they will receive a notification when they are due for their next exam.  The patient was notified of the results.  #[id number] - [facility] DIGITAL SCREEN SELF REFERRAL W OR WO CAD BILATERAL BILATERAL DIGITAL SCREENING MAMMOGRAM 3D/2D WITH CAD: 12/18/2018 CLINICAL: Tomosynthesis.  Self referred screening mammogram.  Comparison is made to exams dated:  09/06/2017 mammogram and 08/25/2016 mammogram - [facility].  The tissue of both breasts is heterogeneously dense. This may lower the sensitivity of mammography.  Current study was also evaluated with a Computer Aided Detection (CAD) system.  There are benign vascular calcifications and a calcification both  breasts.  No significant masses, calcifications, or other findings are seen  in either breast.  There has been no significant interval change. Roseanna Rainbow M.D.          ac/penrad:12/18/2018 13:37:03  copy to: Nelida Gores M.D., [facility]. Imaging Technologist: Lin Landsman RT(R)(M)(QM), [facility]  letter sent: Normal-All Doctor Names  [id number]       PAST MEDICAL, FAMILY AND SOCIAL HISTORY:  Past Medical History:   Diagnosis Date   ??? Arthritis    ??? Atrial fibrillation (HCC)    ??? CAD (coronary artery disease)     Afib    ??? Hypertension    ??? Osteoporosis    ??? Thyroid disease     Removed      Past Surgical History:   Procedure Laterality Date   ??? FEMUR SURGERY  12/2014    left femur   ??? FRACTURE SURGERY     ??? THYROID SURGERY  2005     No family history on file.  Outpatient Medications Marked as Taking for the 12/03/19 encounter (Office Visit) with Helane Rima, MD   Medication Sig Dispense Refill   ??? Cholecalciferol (VITAMIN D3) 50 MCG (2000 UT) CAPS Take by mouth     ??? D-Mannose 500 MG CAPS Take by  mouth 2 times daily      ??? solifenacin (VESICARE) 10 MG tablet Take 1 tablet by mouth daily 30 tablet 2   ??? dilTIAZem (CARDIZEM) 30 MG tablet Take 30 mg by mouth 2 times daily     ??? Ascorbic Acid (VITAMIN C) 500 MG CHEW Take 500 mg by mouth daily     ??? metoprolol succinate (TOPROL XL) 50 MG extended release tablet Take 50 mg by mouth daily     ??? furosemide (LASIX) 20 MG tablet 20 mg Patient taking 1/2 tablet to 1 tablet daily     ??? potassium chloride (KLOR-CON) 10 MEQ extended release tablet      ??? lisinopril (PRINIVIL;ZESTRIL) 20 MG tablet Take 20 mg by mouth daily     ??? atorvastatin (LIPITOR) 40 MG tablet Take 40 mg by mouth daily     ??? nitroGLYCERIN (NITROSTAT) 0.4 MG SL tablet up to max of 3 total doses. If no relief after 1 dose, call 911. 25 tablet 3   ??? acetaminophen (TYLENOL) 500 MG tablet Take 500 mg by mouth every 6 hours as needed for Pain     ??? warfarin (COUMADIN) 3 MG tablet Take 3 mg by mouth daily      ??? levothyroxine (SYNTHROID) 100 MCG tablet Take 100 mcg by mouth Daily      ??? aspirin 81 MG tablet Take 81 mg by mouth daily     ??? IRON PO Take 65 mg by mouth 2 times daily     ??? Calcium Carb-Cholecalciferol (CALCIUM + D3 PO) Take 1,200 mg by mouth 2 times daily     ??? flecainide (TAMBOCOR) 100 MG tablet Take 50 mg by mouth 2 times daily         Patient has no known allergies.  Social History     Tobacco Use   Smoking Status Former Smoker   ??? Quit date: 03/09/1992   ??? Years since quitting: 27.7   Smokeless Tobacco Never Used      (If patient a smoker, smoking cessation counseling offered)   Social History     Substance and Sexual Activity   Alcohol Use None       REVIEW OF SYSTEMS:  Constitutional: negative  Eyes: negative  Respiratory: negative  Cardiovascular: negative  Gastrointestinal: negative  Genitourinary: see HPI  Musculoskeletal: negative  Skin: negative   Neurological: negative  Hematological/Lymphatic: negative  Psychological: negative      Physical Exam:    This a 69 y.o. female  There  were  no vitals filed for this visit.  Body mass index is 21.21 kg/m??.  Constitutional: Patient in no acute distress;         Assessment and Plan        1. Nocturia    2. Frequency of urination    3. Recurrent UTI               Plan:       Jeanice Lim pt  Worsening oab refractory to meds and other therapies  Recurrent UTI- bothersome. This is biggest concern. I did discuss this may worsen with botox. Pt still prefers botox over interstim  Due to incomplete emptying and recurrent UTI, I think interstim will be best option for patient    Will provide interstim reading information  Follow up in one month for telephone visit to discuss interstim    Prescriptions Ordered:  No orders of the defined types were placed in this encounter.     Orders Placed:  No orders of the defined types were placed in this encounter.           Roselina Burgueno ALI Welton Flakes, MD

## 2019-12-03 NOTE — Addendum Note (Signed)
Addended by: Helane Rima on: 12/03/2019 01:25 PM     Modules accepted: Orders

## 2019-12-30 ENCOUNTER — Telehealth: Admit: 2019-12-30 | Discharge: 2019-12-31 | Payer: MEDICARE | Attending: Urology | Primary: Family Medicine

## 2019-12-30 DIAGNOSIS — R350 Frequency of micturition: Secondary | ICD-10-CM

## 2019-12-31 ENCOUNTER — Telehealth

## 2019-12-31 NOTE — Telephone Encounter (Signed)
Patient scheduled for surgery with Dr Welton Flakes on 02/11/20 for Stage 1 Interstim and on 02/25/20 for Stage 2. Patient to do pre op urine culture and chest xray on 01/28/20. Dr Geradine Girt to clear. Dr Arvil Chaco to clear holding of the Coumadin. Surgery instructions mailed to the patient

## 2019-12-31 NOTE — Telephone Encounter (Signed)
[facility] [address] [place], [place] [address]      Phone [phone]   Surgical Scheduling Direct Line Phone [phone] Fax [phone]      JESSLYN VIGLIONE May 31, 1950 female    [place] [address]  [id number] Marital Status: Married         Home Phone: [phone]      Cell Phone:    Telephone Information:   Mobile [phone]          Surgeon: Dr. Humphrey Rolls Surgery Date: 02/11/20   Time: 9:45 am    Procedure: Stage 1 Interstim    Diagnosis: Urinary Frequency    Important Medical History:  In Epic    Special Inst/Equip: Regular    CPT Codes:    62376  Latex Allergy: No     Cardiac Device:  No    Anesthesia:  MAC          Admission Type:  Same Day                        Admit Prior to Day of Surgery: No    Case Location:  Main OR            Preadmission Testing:  Phone Call          PAT Date and Time:______________________________________________________    PAT Confirmation #: ______________________________________________________    Post Op Visit: ___________________________________________________________    Need Preop Cardiac Clearance: Yes    Does Patient have Cardiologist/physician?     Dr Harrietta Guardian    Surgery Confirmation #: __________________________________________________    Gillermina Hu: ________________________   Date: __________________________     Google Name: [employer]

## 2019-12-31 NOTE — Telephone Encounter (Signed)
**   OK TO HOLD COUMADIN 5 DAYS PRIOR TO SURGERY **    Clearance From Dr Brooks Sailors   Appointment Date   Time       Michelle Bright  1950-09-16  Surgeon:  Dr Helane Rima    Procedure:  Stage 1 and 2 Interstim Placement  Date:  02/11/20 and 02/25/20  Facility: [facility]    I.  MEDICAL HISTORY  DM CAD PVD CVA DVT/PE MI CHFMalignant Hyperthemia HTN Tobacco/ETOH Sleep Apnea GERD Hyperlipidemia Renal Insufficiency COPD/Asthma Bleeding Disorder Pacemaker/AICD  II. CURRENT MEDICATIONS: Attach list or complete     Pt is on following meds that need special instructions for surgery:  Anticoagulants Heart Meds ASA Insulin Oral anti-diabetics NSAIDS Diuretics     K replacements  III.  ALLERGIES:   IV.  FUNCTIONAL CAPACITY  >4 METS (CAN VACUUM/HOUSEWORK,CLIMB FLIGHT STAIRS WITHOUT DYSPNEA)  <4METS (FLIGHT OF STEPS CAUSES DYSPNEA/CARDIAC SYMPTOMS)   Stress Test Recommended:    Stress tests or Cardiac Cath in last 5 years:  Yes (attach report)  No   Results: WNL   ABN  Any Change in Cardiac symptoms: Yes  NO  Comments:    Revascularization in last 5 years: Yes  NO  CABG: Yes  No   Comments:     Stents:  Date: ________  Any change in cardiac symptoms  Yes  NO  Comments:   V.  REVIEW OF SYSTEMS:  (Pertinent positive or negative)    VI. PHYSICIAL EXAM  HEENT:1.  Dentations   Good Poor          HT:_______ WT:______      2.  Neck Pathology: Rheumatoid DDD C-spine  BP:______ P:________  PULMONARY:  CARDIAC  ABDOMEN  EXTREMITIES  OTHER  VII.  Testing Ordered by Surgeon  Reviewed by Clearance Physician  Test      Result  Plan,if Abnormal  ___CBS     WNL  ABN____________________  ___BMP/BUN/CR    WNL  ABN____________________  ___K+      WNL  ABN____________________  ___UA      WNL  ABN____________________  ___CXR     WNL  ABN____________________  ___EKG     WNL  ABN____________________  ___MRSA     WNL  ABN____________________  VIII.  ___Acceptable risk for surgery ___Risk Unacceptable-Communication to Follow   Comments:_____________________________________________________  ________________________________________________________________________  Physician ___________________________  Date:_______________  Physician Printed Name:  _________________________    [name]  [phone]

## 2019-12-31 NOTE — Telephone Encounter (Signed)
DO NOT TAKE ASPIRIN, PLAVIX, FISH OIL, COUMADIN, IBUPROFEN, MOTRIN-LIKE DRUGS AND ANY MULTIVITAMINS OR OVER THE COUNTER SUPPLEMENTS 5 DAYS PRIOR TO SURGERY.     Michelle Bright [birth year] Diagnosis:     Surgical Physician: Dr. Anette Guarneri have been scheduled for the procedure marked below:      Surgery: Stage 1 Interstim         Date: 02/11/20     Anesthesia: MAC     Place of Service: [facility] Second Floor Same Day Surgery         Arrive to same day surgery by: 7:30 am  (Surgery time is subject to change)      INSTRUCTIONS AS MARKED BELOW:    1.  DO NOT eat or drink anything after midnight before surgery.  2.  We prefer you shower or bathe with an antibacterial soap (Dial) the morning of surgery.  3.  Please ensure to have a driver with you to transport you home.  4.  Please bring a current medication list, photo ID and insurance card(s) with you  5.  Okay to take Tylenol  6.  If you take Glucophage, Metformin or Janumet, hold 48-hours prior to surgery  7.  Take blood pressure or heart medication as directed, if taken in the morning take with a small sip of water  8. The office will call you in 1-2 days after your procedure to schedule a follow up.     DATE SENSITIVE TESTING *WALK IN *NO APPOINTMENT     Do the pre op urine culture, fasting labs and chest xray on 01/28/20. Orders included        Date: 12/31/2019

## 2019-12-31 NOTE — Telephone Encounter (Signed)
SURGERY [facility] [address] [place], [place] [address]                            Phone [phone]              Surgical Scheduling Direct Line Phone [phone] Fax [phone]  ??  ??  Michelle Bright         08/11/1950      female  ??  [place] [address]          [phone]    Marital Status: Married                                                     Home Phone: [phone]      Cell Phone:    Telephone Information:   Mobile [phone]   ??                                        Surgeon: Dr. Humphrey Rolls     Surgery Date: 02/25/20                       Time: 9:45 am  ??  Procedure: Stage 2 Interstim  ??  Diagnosis: Urinary Frequency  ??  Important Medical History:  In Epic  ??  Special Inst/Equip: Regular  ??  CPT Codes:    [id number]  Latex Allergy: No     Cardiac Device:  No  ??  Anesthesia:    MAC                     Admission Type:  Same Day                        Admit Prior to Day of Surgery: No  ??  Case Location:  Main OR            Preadmission Testing:  Phone Call                     PAT Date and Time:______________________________________________________  ??  PAT Confirmation #: ______________________________________________________  ??  Post Op Visit: ___________________________________________________________  ??  Need Preop Cardiac Clearance: Yes  ??  Does Patient have Cardiologist/physician?     Dr Harrietta Guardian  ??  Surgery Confirmation #: __________________________________________________  ??  Scheduler: ________________________   Date: __________________________   ??  Insurance Company Name: [employer]  ??  ??

## 2019-12-31 NOTE — Progress Notes (Signed)
Michelle Bright is a 69 y.o. female evaluated via telephone on 12/30/2019.      Consent:  She and/or health care decision maker is aware that that she may receive a bill for this telephone service, depending on her insurance coverage, and has provided verbal consent to proceed: Yes      Documentation:  I communicated with the patient and/or health care decision maker about oab.   Details of this discussion including any medical advice provided:     Discussed interstim at length with husband and wife  Will arrange for stage I      I affirm this is a Patient Initiated Episode with a Patient who has not had a related appointment within my department in the past 7 days or scheduled within the next 24 hours.    Patient identification was verified at the start of the visit: Yes    Total Time: minutes: 11-20 minutes    The visit was conducted pursuant to the emergency declaration under the [employer] and the [employer], 1135 waiver authority and the [employer] Act.  Patient identification was verified, and a caregiver was present when appropriate. The patient was located in a state where the provider was credentialed to provide care.    Note: not billable if this call serves to triage the patient into an appointment for the relevant concern      Zacchary Pompei ALI Welton Flakes, MD

## 2019-12-31 NOTE — Telephone Encounter (Signed)
CARDIAC CLEARANCE FORM    Clearance From  Dr Geradine Girt    Appointment Date   Time       Michelle Bright  [birth year]  Surgeon:  Dr Helane Rima    Procedure:  Placement for Stage 1 and 2 Interstim  Date:  02/11/20 ,02/25/20  Facility: [facility]    I.  MEDICAL HISTORY  DM CAD PVD CVA DVT/PE MI CHFMalignant Hyperthemia HTN Tobacco/ETOH Sleep Apnea GERD Hyperlipidemia Renal Insufficiency COPD/Asthma Bleeding Disorder Pacemaker/AICD  II. CURRENT MEDICATIONS: Attach list or complete     Pt is on following meds that need special instructions for surgery:  Anticoagulants Heart Meds ASA Insulin Oral anti-diabetics NSAIDS Diuretics     K replacements  III.  ALLERGIES:   IV.  FUNCTIONAL CAPACITY  >4 METS (CAN VACUUM/HOUSEWORK,CLIMB FLIGHT STAIRS WITHOUT DYSPNEA)  <4METS (FLIGHT OF STEPS CAUSES DYSPNEA/CARDIAC SYMPTOMS)   Stress Test Recommended:    Stress tests or Cardiac Cath in last 5 years:  Yes (attach report)  No   Results: WNL   ABN  Any Change in Cardiac symptoms: Yes  NO  Comments:    Revascularization in last 5 years: Yes  NO  CABG: Yes  No   Comments:     Stents:  Date: ________  Any change in cardiac symptoms  Yes  NO  Comments:   V.  REVIEW OF SYSTEMS:  (Pertinent positive or negative)    VI. PHYSICIAL EXAM  HEENT:1.  Dentations   Good Poor          HT:_______ WT:______      2.  Neck Pathology: Rheumatoid DDD C-spine  BP:______ P:________  PULMONARY:  CARDIAC  ABDOMEN  EXTREMITIES  OTHER  VII.  Testing Ordered by Surgeon  Reviewed by Clearance Physician  Test      Result  Plan,if Abnormal  ___CBS     WNL  ABN____________________  ___BMP/BUN/CR    WNL  ABN____________________  ___K+      WNL  ABN____________________  ___UA      WNL  ABN____________________  ___CXR     WNL  ABN____________________  ___EKG     WNL  ABN____________________  ___MRSA     WNL  ABN____________________  VIII.  ___Acceptable risk for surgery ___Risk Unacceptable-Communication to Follow   Comments:_____________________________________________________  ________________________________________________________________________  Physician ___________________________  Date:_______________  Physician Printed Name:  _________________________    [name]  [phone]

## 2019-12-31 NOTE — Telephone Encounter (Signed)
DO NOT TAKE ASPIRIN, PLAVIX, FISH OIL, COUMADIN, IBUPROFEN, MOTRIN-LIKE DRUGS AND ANY MULTIVITAMINS OR OVER THE COUNTER SUPPLEMENTS 5 DAYS PRIOR TO SURGERY.  ??              Michelle Bright         Nov 13, 1950      Diagnosis:   ??  Surgical Physician: Dr. Humphrey Rolls  ??                          You have been scheduled for the procedure marked below:  ??               Surgery: Stage 2 Interstim                                          Date: 02/25/20   ??  Anesthesia: MAC          ??  Place of Service: [facility] Second Floor Same Day Surgery                                                                          Arrive to same day surgery by: 7:30 am  (Surgery time is subject to change)  ??                          INSTRUCTIONS AS MARKED BELOW:  ??  1.  DO NOT eat or drink anything after midnight before surgery.  2.  We prefer you shower or bathe with an antibacterial soap (Dial) the morning of surgery.  3.  Please ensure to have a driver with you to transport you home.  4.  Please bring a current medication list, photo ID and insurance card(s) with you  5.  Okay to take Tylenol  6.  If you take Glucophage, Metformin or Janumet, hold 48-hours prior to surgery  7.  Take blood pressure or heart medication as directed, if taken in the morning take with a small sip of water  8. The office will call you in 1-2 days after your procedure to schedule a follow up.  ??              DATE SENSITIVE TESTING *WALK IN *NO APPOINTMENT          ??  Do the pre op urine culture on 02/17/20. Orders included                                                    Date: 12/31/2019

## 2019-12-31 NOTE — Telephone Encounter (Signed)
No pre cert for CPT [id number] per the website

## 2020-01-21 ENCOUNTER — Telehealth

## 2020-01-21 MED ORDER — NITROFURANTOIN MONOHYD MACRO 100 MG PO CAPS
100 MG | ORAL_CAPSULE | Freq: Two times a day (BID) | ORAL | 0 refills | Status: AC
Start: 2020-01-21 — End: 2020-01-28

## 2020-01-21 NOTE — Telephone Encounter (Signed)
I have personally verified, reviewed, and approved these actions.    Check urinalysis and culture.  Start Macrobid empirically

## 2020-01-21 NOTE — Telephone Encounter (Signed)
Patient c/o burning and thinks she has a UTI. The frequency has stayed the same. She denies fever or chills.    She would like started on something right away. Orders faxed to [facility] for urine.    Please advise. Thank you.

## 2020-01-21 NOTE — Telephone Encounter (Signed)
Rocky advised Macrobid was sent to the pharmacy to start after giving the urine specimen. He voiced understanding.

## 2020-01-22 LAB — URINALYSIS, MICRO
Bilirubin: NEGATIVE
Glucose: NEGATIVE mg/dL
Ketones, Urine: NEGATIVE mg/dL
Nitrite, Urine: NEGATIVE
Protein, Urine: 30 mg/dL — AB
RBC: 17 /hpf — ABNORMAL HIGH (ref 0–5)
Spec Grav, Fluid: 1.007 (ref 1.003–1.03)
Urobilinogen, Urine: <1.1 eu/dL (ref <1.1)
WBC: 74 /hpf — ABNORMAL HIGH (ref 0–5)
pH: 5.5 (ref 5.0–8.5)

## 2020-01-22 LAB — CULTURE, URINE

## 2020-01-27 NOTE — Telephone Encounter (Signed)
Rocky on [facility] advised of the urine results and to continue Macrobid until completed. He voiced understanding.

## 2020-01-27 NOTE — Telephone Encounter (Signed)
-----   Message from Helane Rima, MD sent at 01/24/2020 11:00 AM EST -----  Macrobid was sent to pharmacy by [facility] and this is correct abx

## 2020-01-28 ENCOUNTER — Inpatient Hospital Stay: Admit: 2020-01-28 | Payer: MEDICARE | Primary: Family Medicine

## 2020-01-28 ENCOUNTER — Encounter

## 2020-01-28 ENCOUNTER — Encounter: Admit: 2020-01-28 | Primary: Family Medicine

## 2020-01-28 DIAGNOSIS — R350 Frequency of micturition: Secondary | ICD-10-CM

## 2020-01-28 DIAGNOSIS — Z1231 Encounter for screening mammogram for malignant neoplasm of breast: Secondary | ICD-10-CM

## 2020-01-28 LAB — BASIC METABOLIC PANEL
BUN: 19 mg/dL (ref 7–22)
CO2: 28 meq/L (ref 23–33)
Calcium: 9.5 mg/dL (ref 8.5–10.5)
Chloride: 103 meq/L (ref 98–111)
Creatinine: 0.7 mg/dL (ref 0.4–1.2)
Glucose: 115 mg/dL — ABNORMAL HIGH (ref 70–108)
Potassium: 4.7 meq/L (ref 3.5–5.2)
Sodium: 146 meq/L — ABNORMAL HIGH (ref 135–145)

## 2020-01-28 LAB — CBC WITH AUTO DIFFERENTIAL
Basophils Absolute: 0.1 10*3/uL (ref 0.0–0.1)
Basophils: 1 %
Eosinophils Absolute: 0.2 10*3/uL (ref 0.0–0.4)
Eosinophils: 1.4 %
Hematocrit: 49.6 % — ABNORMAL HIGH (ref 37.0–47.0)
Hemoglobin: 15.4 gm/dl (ref 12.0–16.0)
Immature Grans (Abs): 0.03 10*3/uL (ref 0.00–0.07)
Immature Granulocytes: 0.3 %
Lymphocytes Absolute: 1.9 10*3/uL (ref 1.0–4.8)
Lymphocytes: 16.8 %
MCH: 31.8 pg (ref 26.0–33.0)
MCHC: 31 gm/dl — ABNORMAL LOW (ref 32.2–35.5)
MCV: 102.5 fL — ABNORMAL HIGH (ref 81.0–99.0)
MPV: 10.8 fL (ref 9.4–12.4)
Monocytes Absolute: 0.9 10*3/uL (ref 0.4–1.3)
Monocytes: 8 %
Platelets: 177 10*3/uL (ref 130–400)
RBC: 4.84 10*6/uL (ref 4.20–5.40)
RDW-CV: 13.9 % (ref 11.5–14.5)
RDW-SD: 52.7 fL — ABNORMAL HIGH (ref 35.0–45.0)
Seg Neutrophils: 72.5 %
Segs Absolute: 8.4 10*3/uL — ABNORMAL HIGH (ref 1.8–7.7)
WBC: 11.6 10*3/uL — ABNORMAL HIGH (ref 4.8–10.8)
nRBC: 0 /100 wbc

## 2020-01-28 LAB — GLOMERULAR FILTRATION RATE, ESTIMATED: Est, Glom Filt Rate: 82 mL/min/{1.73_m2} — AB

## 2020-01-28 LAB — ANION GAP: Anion Gap: 15 meq/L (ref 8.0–16.0)

## 2020-01-29 LAB — CULTURE, URINE

## 2020-02-05 NOTE — Progress Notes (Signed)
In preparation for their surgical procedure above patient was screened for Obstructive Sleep Apnea (OSA) using the STOP-Bang Questionnaire by the Pre-Admission Testing department.  This is a pre-surgical screening tool for patient safety and serves as a recommendation, this WILL NOT cause cancellation of surgery.    STOP-Bang Questionnaire  * Do you currently see a pulmonologist?  No     If yes STOP, do not complete.  Patient follows with Dr. .    1.  Do you snore loudly (able to be heard in the next room)?      No    2.  Do you often feel tired or sleepy during the daytime?          No       3.  Has anyone ever told you that you stop breathing during your sleep?       No    4.  Do you have or are you being treated for high blood pressure?          Yes      5.  BMI more than 35?  BMI (Calculated): 0        No    6.  Age over 53 years? 70 y.o.      Yes    7.  Neck Circumference greater than 17 inches for female or 16 inches for female?  Measured           (visits only)            No    8.  Gender Female?                 No      TOTAL SCORE: 2    OSA - Low Risk : Yes to 0 - 2 questions  OSA - Intermediate Risk : Yes to 3 - 4 questions  OSA - High Risk : Yes to 5 - 8 questions    Adapted from:   STOP Questionnaire: A Tool to Screen Patients for Obstructive Sleep Apnea   Coralie Carpen, F.R.C.P.C., Marc Morgans, M.B.B.S., Yolanda Bonine, M.D., Vergia Alberts. Dory Peru, Ph.D., Lenard Lance, M.B.B.S., Tamera Reason, M.Sc., Bernardo Heater, M.D., Lawson Fiscal. Nile Riggs, F.R.C.P.C.   Anesthesiology 2008; 108:812-21 Copyright 2008, the American Society of Anesthesiologists, Inc. [employer].   ----------------------------------------------------------------------------------------------------------------

## 2020-02-05 NOTE — Progress Notes (Signed)
Weight: 104 lb (47.2 kg) Weight Method: Stated                          Weight: 104 lb (47.2 kg) Weight Method: Stated                          Weight: 104 lb (47.2 kg) Weight Method: Stated Covid screening questionnaire complete and negative for symptoms or exposure see chart for documentation.  Please notify your doctor if you develop any signs of illness  Please wear a mask when you come to the hospitalFollowing instructions given to patient, who states understanding:     NPO after midnight  Bring insurance info and driver's license  Wear comfortable clean clothing  Do not bring jewelry   Shower night before and morning of surgery with a liquid antibacterial soap  Bring medications in original bottles  Follow all instructions given by your physician  Driver needed at discharge  Call PAT [phone] for any questions  Report to SDS on 2nd floor  If you would become ill prior to surgery, please call the surgeon  May have only 1 visitor accompany you for surgery  Please bring and wear mask  You will be receiving a phone call one or two days before surgery to review covid screening exposure     Covid screening questionnaire complete and negative for symptoms or exposure see chart for documentation.   Please limit your exposure to the public after you have your covid test  Please call your doctor immediately if you develop any symptoms of covid prior to your surgery

## 2020-02-11 ENCOUNTER — Ambulatory Visit: Admit: 2020-02-11 | Payer: MEDICARE | Primary: Family Medicine

## 2020-02-11 ENCOUNTER — Inpatient Hospital Stay: Payer: MEDICARE

## 2020-02-11 LAB — PROTIME-INR: INR: 1.09 (ref 0.85–1.13)

## 2020-02-11 LAB — POCT GLUCOSE: POC Glucose: 103 mg/dl (ref 70–108)

## 2020-02-11 MED ORDER — BUPIVACAINE HCL (PF) 0.5 % IJ SOLN
0.5 | INTRAMUSCULAR | Status: AC
Start: 2020-02-11 — End: 2020-02-11

## 2020-02-11 MED ORDER — HYDROCODONE-ACETAMINOPHEN 5-325 MG PO TABS
5-325 MG | ORAL_TABLET | Freq: Four times a day (QID) | ORAL | 0 refills | Status: AC | PRN
Start: 2020-02-11 — End: 2020-02-16

## 2020-02-11 MED ORDER — FENTANYL CITRATE (PF) 100 MCG/2ML IJ SOLN
100 | INTRAMUSCULAR | Status: AC
Start: 2020-02-11 — End: 2020-02-11

## 2020-02-11 MED ORDER — KETAMINE HCL 50 MG/5ML IJ SOSY
50 | INTRAMUSCULAR | Status: AC
Start: 2020-02-11 — End: 2020-02-11

## 2020-02-11 MED ORDER — SODIUM CHLORIDE 0.9 % IV SOLN
0.9 % | INTRAVENOUS | Status: DC
Start: 2020-02-11 — End: 2020-02-11
  Administered 2020-02-11: 14:00:00 via INTRAVENOUS

## 2020-02-11 MED ORDER — CEFAZOLIN SODIUM 1 G IJ SOLR
1 g | INTRAMUSCULAR | Status: DC | PRN
Start: 2020-02-11 — End: 2020-02-11
  Administered 2020-02-11: 14:00:00 2000 via INTRAVENOUS

## 2020-02-11 MED ORDER — LIDOCAINE HCL (CARDIAC) 100 MG/5ML IV SOSY
100 | INTRAVENOUS | Status: AC
Start: 2020-02-11 — End: 2020-02-11

## 2020-02-11 MED ORDER — CEPHALEXIN 500 MG PO CAPS
500 MG | ORAL_CAPSULE | Freq: Three times a day (TID) | ORAL | 0 refills | Status: AC
Start: 2020-02-11 — End: 2020-02-16

## 2020-02-11 MED ORDER — DEXAMETHASONE SOD PHOSPHATE PF 10 MG/ML IJ SOSY
10 | INTRAMUSCULAR | Status: AC
Start: 2020-02-11 — End: 2020-02-11

## 2020-02-11 MED ORDER — ONDANSETRON HCL 4 MG/2ML IJ SOLN
4 MG/2ML | INTRAMUSCULAR | Status: DC | PRN
Start: 2020-02-11 — End: 2020-02-11
  Administered 2020-02-11: 15:00:00 4 via INTRAVENOUS

## 2020-02-11 MED ORDER — ONDANSETRON HCL 4 MG/2ML IJ SOLN
4 | INTRAMUSCULAR | Status: AC
Start: 2020-02-11 — End: 2020-02-11

## 2020-02-11 MED ORDER — FENTANYL CITRATE (PF) 100 MCG/2ML IJ SOLN
100 MCG/2ML | INTRAMUSCULAR | Status: DC | PRN
Start: 2020-02-11 — End: 2020-02-11
  Administered 2020-02-11: 14:00:00 100 via INTRAVENOUS

## 2020-02-11 MED ORDER — PROPOFOL 200 MG/20ML IV EMUL
200 | INTRAVENOUS | Status: DC | PRN
Start: 2020-02-11 — End: 2020-02-11
  Administered 2020-02-11: 15:00:00 30 via INTRAVENOUS
  Administered 2020-02-11: 15:00:00 10 via INTRAVENOUS
  Administered 2020-02-11: 15:00:00 20 via INTRAVENOUS

## 2020-02-11 MED ORDER — KETAMINE HCL 50 MG/5ML IJ SOSY
50 MG/5ML | INTRAMUSCULAR | Status: DC | PRN
Start: 2020-02-11 — End: 2020-02-11
  Administered 2020-02-11 (×2): 10 via INTRAVENOUS

## 2020-02-11 MED ORDER — BUPIVACAINE HCL (PF) 0.5 % IJ SOLN
0.5 % | INTRAMUSCULAR | Status: DC | PRN
Start: 2020-02-11 — End: 2020-02-11
  Administered 2020-02-11: 15:00:00 20 via INTRADERMAL

## 2020-02-11 MED ORDER — LIDOCAINE HCL (CARDIAC) 100 MG/5ML IV SOSY
1005 MG/5ML | INTRAVENOUS | Status: DC | PRN
Start: 2020-02-11 — End: 2020-02-11
  Administered 2020-02-11: 14:00:00 30 via INTRAVENOUS

## 2020-02-11 MED ORDER — DEXAMETHASONE SOD PHOSPHATE PF 10 MG/ML IJ SOSY
10 MG/ML | INTRAMUSCULAR | Status: DC | PRN
Start: 2020-02-11 — End: 2020-02-11
  Administered 2020-02-11: 15:00:00 8 via INTRAVENOUS

## 2020-02-11 MED ORDER — PROPOFOL 200 MG/20ML IV EMUL
200 | INTRAVENOUS | Status: AC
Start: 2020-02-11 — End: 2020-02-11

## 2020-02-11 MED FILL — DEXAMETHASONE SOD PHOSPHATE PF 10 MG/ML IJ SOSY: 10 mg/mL | INTRAMUSCULAR | Qty: 1

## 2020-02-11 MED FILL — ONDANSETRON HCL 4 MG/2ML IJ SOLN: 4 MG/2ML | INTRAMUSCULAR | Qty: 2

## 2020-02-11 MED FILL — MARCAINE PRESERVATIVE FREE 0.5 % IJ SOLN: 0.5 % | INTRAMUSCULAR | Qty: 60

## 2020-02-11 MED FILL — FENTANYL CITRATE (PF) 100 MCG/2ML IJ SOLN: 100 MCG/2ML | INTRAMUSCULAR | Qty: 2

## 2020-02-11 MED FILL — PROPOFOL 200 MG/20ML IV EMUL: 200 MG/20ML | INTRAVENOUS | Qty: 20

## 2020-02-11 MED FILL — KETAMINE HCL 50 MG/5ML IJ SOSY: 50 MG/5ML | INTRAMUSCULAR | Qty: 5

## 2020-02-11 MED FILL — LIDOCAINE HCL (CARDIAC) 100 MG/5ML IV SOSY: 100 MG/5ML | INTRAVENOUS | Qty: 5

## 2020-02-11 MED FILL — MARCAINE PRESERVATIVE FREE 0.5 % IJ SOLN: 0.5 % | INTRAMUSCULAR | Qty: 30

## 2020-02-11 NOTE — Progress Notes (Signed)
Pt has met discharge criteria and states she is ready for discharge to home. IV removed, gauze and tape applied. Dressed in own clothes and personal belongings gathered. Discharge instructions (with opioid medication education information) given to pt and family; pt and family verbalized understanding of discharge instructions, prescriptions and follow up appointments. Pt transported to discharge lobby by [facility] staff.

## 2020-02-11 NOTE — Anesthesia Pre-Procedure Evaluation (Signed)
Department of Anesthesiology  Preprocedure Note       Name:  Michelle Bright   Age:  70 y.o.  DOB:  August 05, 1950                                          MRN:  [id number]         Date:  02/11/2020      Surgeon: Juliann Mule):  Kennith Maes, MD    Procedure: Procedure(s):  STAGE 1 INTERSTIM    Medications prior to admission:   Prior to Admission medications    Medication Sig Start Date End Date Taking? Authorizing Provider   Cholecalciferol (VITAMIN D3) 50 MCG (2000 UT) CAPS Take by mouth    Historical Provider, MD   D-Mannose 500 MG CAPS Take by mouth 2 times daily     Historical Provider, MD   solifenacin (VESICARE) 10 MG tablet Take 1 tablet by mouth daily 12/03/19 12/02/20  Kennith Maes, MD   dilTIAZem (CARDIZEM) 30 MG tablet Take 30 mg by mouth 2 times daily 08/26/19   Historical Provider, MD   Ascorbic Acid (VITAMIN C) 500 MG CHEW Take 500 mg by mouth daily    Historical Provider, MD   metoprolol succinate (TOPROL XL) 50 MG extended release tablet Take 50 mg by mouth daily    Historical Provider, MD   furosemide (LASIX) 20 MG tablet 20 mg Patient taking 1/2 tablet to 1 tablet daily 08/20/18   Historical Provider, MD   potassium chloride (KLOR-CON) 10 MEQ extended release tablet  07/10/18   Historical Provider, MD   lisinopril (PRINIVIL;ZESTRIL) 20 MG tablet Take 20 mg by mouth daily    Historical Provider, MD   atorvastatin (LIPITOR) 40 MG tablet Take 40 mg by mouth daily    Historical Provider, MD   nitroGLYCERIN (NITROSTAT) 0.4 MG SL tablet up to max of 3 total doses. If no relief after 1 dose, call 911. 04/11/15   Adel Tyson Dense, MD   acetaminophen (TYLENOL) 500 MG tablet Take 500 mg by mouth every 6 hours as needed for Pain    Historical Provider, MD   warfarin (COUMADIN) 3 MG tablet Take 3 mg by mouth daily     Historical Provider, MD   levothyroxine (SYNTHROID) 100 MCG tablet Take 100 mcg by mouth Daily     Historical Provider, MD   aspirin 81 MG tablet Take 81 mg by mouth daily    Historical Provider, MD   IRON PO Take 65  mg by mouth 2 times daily    Historical Provider, MD   Calcium Carb-Cholecalciferol (CALCIUM + D3 PO) Take 1,200 mg by mouth 2 times daily    Historical Provider, MD   flecainide (TAMBOCOR) 100 MG tablet Take 50 mg by mouth 2 times daily    Historical Provider, MD       Current medications:    Current Facility-Administered Medications   Medication Dose Route Frequency Provider Last Rate Last Admin   ??? 0.9 % sodium chloride infusion   IntraVENous Continuous Kennith Maes, MD           Allergies:  No Known Allergies    Problem List:  There is no problem list on file for this patient.      Past Medical History:        Diagnosis Date   ??? Arthritis    ??? Atrial fibrillation (  [place])    ??? CAD (coronary artery disease)     Afib    ??? Cerebral artery occlusion with cerebral infarction (HCC)     frequency of urine   ??? Hypertension    ??? Osteoporosis    ??? Thyroid disease     Removed        Past Surgical History:        Procedure Laterality Date   ??? FEMUR SURGERY  12/2014    left femur   ??? FRACTURE SURGERY     ??? THYROID SURGERY  2005       Social History:    Social History     Tobacco Use   ??? Smoking status: Former Smoker     Quit date: 03/09/1992     Years since quitting: 27.9   ??? Smokeless tobacco: Never Used   Substance Use Topics   ??? Alcohol use: Not Currently                                Counseling given: Not Answered      Vital Signs (Current):   Vitals:    02/05/20 1313 02/11/20 0744   BP:  (!) 173/82   Pulse:  91   Resp:  18   Temp:  97.6 ??F (36.4 ??C)   TempSrc:  Temporal   SpO2:  96%   Weight: 104 lb (47.2 kg) 103 lb (46.7 kg)   Height:  _0  (1.499 m)                                              BP Readings from Last 3 Encounters:   02/11/20 (!) 173/82   11/07/19 (!) 146/80   09/04/19 138/88       NPO Status: Time of last liquid consumption: 1800                        Time of last solid consumption: 1800                        Date of last liquid consumption: 02/10/20                        Date of last solid food  consumption: 02/10/20    BMI:   Wt Readings from Last 3 Encounters:   02/11/20 103 lb (46.7 kg)   12/03/19 105 lb (47.6 kg)   11/07/19 102 lb (46.3 kg)     Body mass index is 20.8 kg/m??.    CBC:   Lab Results   Component Value Date    WBC 11.6 01/28/2020    RBC 4.84 01/28/2020    RBC 17 01/21/2020    HGB 15.4 01/28/2020    HCT 49.6 01/28/2020    MCV 102.5 01/28/2020    RDW 15.2 04/11/2015    PLT 177 01/28/2020       CMP:   Lab Results   Component Value Date    NA 146 01/28/2020    K 4.7 01/28/2020    CL 103 01/28/2020    CO2 28 01/28/2020    BUN 19 01/28/2020    CREATININE 0.7 01/28/2020    LABGLOM 82 01/28/2020    GLUCOSE 115 01/28/2020  PROT 7.2 04/11/2015    CALCIUM 9.5 01/28/2020    BILITOT Negative 01/21/2020    ALKPHOS 85 04/11/2015    AST 17 04/11/2015    ALT 12 04/11/2015       POC Tests: No results for input(s): POCGLU, POCNA, POCK, POCCL, POCBUN, POCHEMO, POCHCT in the last 72 hours.    Coags:   Lab Results   Component Value Date    INR 2.17 04/10/2015       HCG (If Applicable): No results found for: PREGTESTUR, PREGSERUM, HCG, HCGQUANT     ABGs: No results found for: PHART, PO2ART, PCO2ART, HCO3ART, BEART, O2SATART     Type & Screen (If Applicable):  No results found for: LABABO, LABRH    Drug/Infectious Status (If Applicable):  No results found for: HIV, HEPCAB    COVID-19 Screening (If Applicable): No results found for: COVID19        Anesthesia Evaluation    Airway: Mallampati: II  TM distance: >3 FB   Neck ROM: full  Mouth opening: > = 3 FB Dental:          Pulmonary:   (+) decreased breath sounds,                             Cardiovascular:    (+) hypertension:, CAD:, dysrhythmias: atrial flutter,         Rhythm: regular                      Neuro/Psych:   (+) CVA:,             GI/Hepatic/Renal:             Endo/Other:                     Abdominal:             Vascular:          Other Findings:             Anesthesia Plan      MAC     ASA 4       Induction: intravenous.    MIPS: Prophylactic  antiemetics administered.  Anesthetic plan and risks discussed with patient.      Plan discussed with CRNA.                  Elesa Massed, MD   02/11/2020

## 2020-02-11 NOTE — Progress Notes (Signed)
Pt admitted to SDS room 8 and oriented to unit. SCD sleeves applied. Nares swabbed. Pt verbalized permission for first name, last initial and physicians name on white board. SDS board and discharge criteria explained, pt and family verbalized understanding. Pt denies thoughts of harming self or others. Call light in reach. Family at the bedside.

## 2020-02-11 NOTE — H&P (Addendum)
Orson Slick, MD  History and Physical    Patient:  Michelle Bright  MRN: [id number]  Date of birth: 02/02/1950    HISTORY OF PRESENT ILLNESS:     The patient is a 70 y.o. female who presents with oab. Here for procedure.    Patient's old records, notes and chart reviewed and summarized above.     Courtnie Brenes ALI Welton Flakes, MD independently reviewed the images and verified the radiology reports from:    XR CHEST (2 VW)    Result Date: 01/28/2020  PROCEDURE: XR CHEST (2 VW) CLINICAL INFORMATION: Frequency of urination, Pre-op testing. COMPARISON: No prior study. TECHNIQUE: PA and lateral views of the chest were obtained. FINDINGS: The lungs are clear. The lungs are hyperinflated and there is flattening of the bilateral hemidiaphragms, findings likely related to emphysematous changes. There is cardiomegaly. The pulmonary vasculature is within normal limits. Atherosclerotic calcifications are in the arch of the aorta. There is no significant pleural effusion or pneumothorax. Visualized portions of the upper abdomen are within normal limits. The osseous structures are intact. There is generalized osteopenia. There are some degenerative changes noted in the thoracic spine. No acute fractures or suspicious osseous lesions.     Emphysematous changes and cardiomegaly. No acute process. **This report has been created using voice recognition software. It may contain minor errors which are inherent in voice recognition technology.** Final report electronically signed by Dr Anders Grant on 01/28/2020 8:46 AM    MAM TOMO DIGITAL SCREEN SELF REFERRAL W OR WO CAD BILATERAL    Result Date: 01/28/2020  LOCATION: [facility] PROCEDURE: MAM TOMO DIGITAL SCREEN SELF REFERRAL W OR WO CAD BILATERAL CLINICAL INFORMATION: Visit for screening mammogram. Tomosynthesis. CLINICAL:     Self-referred screening mammogram PATIENT MEDICAL HISTORY:  No relevant medical history has been documented for this patient. FAMILY HISTORY:   No relevant family history has been  documented for this patient. RISK VALUES: Tyrer-Cuzick 67yr.: 2.8%, Tyrer-Cuzick life:   4.2% COMPARISON: 12/18/2018, 09/06/2017, 08/25/2016 and 08/24/2015. TECHNIQUE: Bilateral CC and MLO views of the breasts were obtained.  3D tomosynthesis was utilized.  CAD was utilized. BREAST COMPOSITION: The tissue of the breast(s) is heterogeneously dense. This may lower the sensitivity of mammography. FINDINGS:  There are vascular calcifications bilaterally. No significant masses, calcifications, or other findings are seen in the breast(s). There has been no significant interval change.     No mammographic evidence of malignancy. A 1 year screening mammogram is recommended. A result letter will be sent to the patient.  She will also receive a reminder 1 month prior to her next mammogram. . BI-RADS CATEGORY 2: BENIGN FINDINGS. Management Recommendation: Routine annual mammography. **This report has been created using voice recognition software. It may contain minor errors which are inherent in voice recognition technology.** Final report electronically signed by Dr. Jacques Navy on 01/28/2020 11:28 AM        Past Medical History:    Past Medical History:   Diagnosis Date   ??? Arthritis    ??? Atrial fibrillation (HCC)    ??? CAD (coronary artery disease)     Afib    ??? Cerebral artery occlusion with cerebral infarction (HCC)     frequency of urine   ??? Hypertension    ??? Osteoporosis    ??? Thyroid disease     Removed        Past Surgical History:    Past Surgical History:   Procedure Laterality Date   ??? FEMUR SURGERY  12/2014    left femur   ??? FRACTURE SURGERY     ??? THYROID SURGERY  2005       Medications Prior to Admission:    Prior to Admission medications    Medication Sig Start Date End Date Taking? Authorizing Provider   Cholecalciferol (VITAMIN D3) 50 MCG (2000 UT) CAPS Take by mouth    Historical Provider, MD   D-Mannose 500 MG CAPS Take by mouth 2 times daily     Historical Provider, MD   solifenacin (VESICARE) 10 MG tablet Take 1  tablet by mouth daily 12/03/19 12/02/20  Helane Rima, MD   dilTIAZem (CARDIZEM) 30 MG tablet Take 30 mg by mouth 2 times daily 08/26/19   Historical Provider, MD   Ascorbic Acid (VITAMIN C) 500 MG CHEW Take 500 mg by mouth daily    Historical Provider, MD   metoprolol succinate (TOPROL XL) 50 MG extended release tablet Take 50 mg by mouth daily    Historical Provider, MD   furosemide (LASIX) 20 MG tablet 20 mg Patient taking 1/2 tablet to 1 tablet daily 08/20/18   Historical Provider, MD   potassium chloride (KLOR-CON) 10 MEQ extended release tablet  07/10/18   Historical Provider, MD   lisinopril (PRINIVIL;ZESTRIL) 20 MG tablet Take 20 mg by mouth daily    Historical Provider, MD   atorvastatin (LIPITOR) 40 MG tablet Take 40 mg by mouth daily    Historical Provider, MD   nitroGLYCERIN (NITROSTAT) 0.4 MG SL tablet up to max of 3 total doses. If no relief after 1 dose, call 911. 04/11/15   Adel Heloise Beecham, MD   acetaminophen (TYLENOL) 500 MG tablet Take 500 mg by mouth every 6 hours as needed for Pain    Historical Provider, MD   warfarin (COUMADIN) 3 MG tablet Take 3 mg by mouth daily     Historical Provider, MD   levothyroxine (SYNTHROID) 100 MCG tablet Take 100 mcg by mouth Daily     Historical Provider, MD   aspirin 81 MG tablet Take 81 mg by mouth daily    Historical Provider, MD   IRON PO Take 65 mg by mouth 2 times daily    Historical Provider, MD   Calcium Carb-Cholecalciferol (CALCIUM + D3 PO) Take 1,200 mg by mouth 2 times daily    Historical Provider, MD   flecainide (TAMBOCOR) 100 MG tablet Take 50 mg by mouth 2 times daily    Historical Provider, MD       Allergies:  Patient has no known allergies.    Social History:    Social History     Socioeconomic History   ??? Marital status: Married     Spouse name: Not on file   ??? Number of children: Not on file   ??? Years of education: Not on file   ??? Highest education level: Not on file   Occupational History   ??? Not on file   Tobacco Use   ??? Smoking status: Former Smoker      Quit date: 03/09/1992     Years since quitting: 27.9   ??? Smokeless tobacco: Never Used   Vaping Use   ??? Vaping Use: Never used   Substance and Sexual Activity   ??? Alcohol use: Not Currently   ??? Drug use: Never   ??? Sexual activity: Not on file   Other Topics Concern   ??? Not on file   Social History Narrative   ??? Not on file  Social Determinants of Health     Financial Resource Strain:    ??? Difficulty of Paying Living Expenses: Not on file   Food Insecurity:    ??? Worried About Programme researcher, broadcasting/film/video in the Last Year: Not on file   ??? Ran Out of Food in the Last Year: Not on file   Transportation Needs:    ??? Lack of Transportation (Medical): Not on file   ??? Lack of Transportation (Non-Medical): Not on file   Physical Activity:    ??? Days of Exercise per Week: Not on file   ??? Minutes of Exercise per Session: Not on file   Stress:    ??? Feeling of Stress : Not on file   Social Connections:    ??? Frequency of Communication with Friends and Family: Not on file   ??? Frequency of Social Gatherings with Friends and Family: Not on file   ??? Attends Religious Services: Not on file   ??? Active Member of Clubs or Organizations: Not on file   ??? Attends Banker Meetings: Not on file   ??? Marital Status: Not on file   Intimate Partner Violence:    ??? Fear of Current or Ex-Partner: Not on file   ??? Emotionally Abused: Not on file   ??? Physically Abused: Not on file   ??? Sexually Abused: Not on file   Housing Stability:    ??? Unable to Pay for Housing in the Last Year: Not on file   ??? Number of Places Lived in the Last Year: Not on file   ??? Unstable Housing in the Last Year: Not on file       Family History:  History reviewed. No pertinent family history.    REVIEW OF SYSTEMS:  Constitutional: negative  Eyes: negative  Respiratory: negative  Cardiovascular: negative  Gastrointestinal: negative  Genitourinary: no acute issues  Musculoskeletal: negative  Skin: negative   Neurological: negative  Hematological/Lymphatic:  negative  Psychological: negative    Physical Exam:      No data found.  Constitutional: Patient in no acute distress;   Neuro: alert and oriented to person place and time.    Psych: Mood and affect normal.  Skin: Normal  Lungs: Respiratory effort normal, CTA  Cardiovascular:  Normal peripheral pulses; no murmur. Normal rhythm  Abdomen: Soft, non-tender, non-distended with no CVA, flank pain, hepatosplenomegaly or hernia.  Kidneys normal.  Bladder non-tender and not distended.      LABS:   No results for input(s): WBC, HGB, HCT, MCV, PLT in the last 72 hours.  No results for input(s): NA, K, CL, CO2, PHOS, BUN, CREATININE, CA in the last 72 hours.  No results found for: PSA      Urinalysis: No results for input(s): COLORU, PHUR, LABCAST, WBCUA, RBCUA, MUCUS, TRICHOMONAS, YEAST, BACTERIA, CLARITYU, SPECGRAV, LEUKOCYTESUR, UROBILINOGEN, BILIRUBINUR, BLOODU in the last 72 hours.    Invalid input(s): NITRATE, GLUCOSEUKETONESUAMORPHOUS     -----------------------------------------------------------------      Assessment and Plan     Impression:  There is no problem list on file for this patient.      Plan:     Consent obtained; sns stage I in OR today    Shayanne Gomm ALI Welton Flakes, MD  7:26 AM 02/11/2020

## 2020-02-11 NOTE — Discharge Instructions (Signed)
Pt ok to discharge home in good condition  No heavy lifting, >10 lbs for today  Pt should avoid strenuous activity for today  Pt should walk moderately at home  Pt ok to shower   Pt may resume diet as tolerated  Pt should take Rx as directed  No driving while on narcotics  Please call attending physician or hospital operator with questions  Call or Present to ED if fever (> 101F), intractable nausea vomiting or pain.  Rx in chart    Pt should follow up with Tangee Marszalek ALI Dayla Gasca, MD, in 2 weeks, call to confirm appointment        HOLD blood thinners for 48 hours then restart blood thinners  Hold blood thinners 5 days before next procedure

## 2020-02-11 NOTE — Op Note (Signed)
Patient:  Michelle Bright  MRN: [id number]  Date of birth: Jan 27, 1950    FACILITY: [facility]    DATE: 02/11/2020    SURGEON: Orson Slick, MD     ASSISTANT: none    PREOPERATIVE DIAGNOSIS: URINARY FREQUENCY    POSTOPERATIVE DIAGNOSIS: oab with UUI    PROCEDURE PERFORMED:     1. Interstim Sacral Neuromodulation System Placement, Stage 1    ANESTHESIA: Monitor Anesthesia Care    ESTIMATED BLOOD LOSS: 5 ml    COMPLICATIONS: None immediate    DRAINS: none    SPECIMENS: * No specimens in log *    INDICATIONS FOR PROCEDURE:  The patient is a 70 y.o. female who presents with urinary frequency, urgency and urge incontinence which has been refractory to anticholinergic medications and has been very bothersome.  She was given the option of third line management, which was either a Botox injection or neuromodulation. The risks and benefits of treatment as well as all available alternatives and complications were discussed and she wished to undergo interstim placement.      Narrative of the Procedure:    The patient was properly identified and placed in prone position. Pillows were placed under lower abdomen to flatten sacrum and under shins to allow the toes to dangle freely. A ground pad was placed on the bottom of the patient???s foot and the long test stimulation cable was connected to the ground pad and the external test stimulator. The patient was prepped and draped in usual sterile fashion. A timeout occurred in which two patient identifiers were used. The sciatic notches and sacral midline were identified using fluoroscopy. Local injection was administered around the anticipated foramen needle entry point which was 2cm lateral to the sacral midline and 2cm cephalad of sciatic notch level.    A foramen needle was introduced at an approximate 60 degree angle, feeling for the foraminal margins until S3 was identified. Proper needle position was confirmed by stimulating the needle and observing a  ???bellowing??? response, and also plantar flexion of the big toe. In addition, fluoroscopic images demonstrated appropriate placement. The foramen needle stylet was removed and a directional guide was placed through the needle. The foramen needle was removed by sliding it over the directional guide. A small incision was made next to the directional guide through the skin with a 15-blade knife. The lead introducer with dilator was placed over the directional guide. Utilizing fluoroscopic guidance the lead introducer was advanced until the radiopaque mark was half-way through the foramen. The dilator was removed along with the directional guide. Using fluoroscopy, the tined lead with the angled stylet was placed through the introducer until electrodes two and three straddled the anterior edge of the sacrum. All four electrodes were tested, observing ???bellows??? and plantar flexion of the great toe. 3 electrodes demonstrated excellent responses. After satisfactory lead positioning was confirmed, the introducer was retracted over the lead under continuous fluoroscopy, deploying the tines into the presacral tissues. Re-testing after removal of the introducer re-confirmed the aforementioned responses.    The potential internal neurostimulator pocket site was identified on the patient's right side below the iliac crest and lateral to the sacrum. Local was administered and an incision was made into the subcutaneous tissue creating a connection site. Blunt dissection was used to create a small pocket with hemostasis achieved. A tunneling tool with sheath was placed from the lead exit site subcutaneously to the small incised pocket site. The tunneling tool was removed  and the lead was fed through the sheath, exiting at the pocket site. The sheath was removed. The lead was cleaned and dried. A protective boot was placed over the lead; the lead was inserted into the temporary percutaneous extension with visual confirmation of blue  tip advancement. The four sets-crews were tightened with the torque wrench until audible clicks were heard. The boot was slid over the connection and 2-0 prolene ties were used to secure the boot in place.    Using the tunneling tool and sheath, a subcutaneous tunnel was created from the pocket site to the contralateral buttock. The percutaneous extension was placed through the sheath, the sheath removed, leaving the extension exiting the site. The connection components were placed into the incised pocket. The incisions and pocket were irrigated and closed. The dermis was re-approximated with 2-0 Vicryl. The skin was closed with a 4-0 Monocryl in a subcuticular manner. The percutaneous extension was attached to the external gray twist-lock cable. The patient was awakened and transferred to the PACU in good and stable condition.    In the PACU, using the external test stimulator, the patient was programmed to the electrode of optimum sensation and provided utilization instructions prior to discharge.       Follow-Up: Patient will complete a voiding diary during the testing period. Should they have an appropriate response, the patient will present for a stage 2 procedure in the coming weeks. In addition, the patient will be discharged on 5 days of Keflex.       Orson Slick, MD  Electronically signed on 02/11/2020 at 10:37 AM

## 2020-02-11 NOTE — Anesthesia Post-Procedure Evaluation (Signed)
Department of Anesthesiology  Postprocedure Note    Patient: Michelle Bright  MRN: [id number]  Birthdate: 02-11-1950  Date of evaluation: 02/11/2020  Time:  11:24 AM     Procedure Summary     Date: 02/11/20 Room / Location: [facility]    Anesthesia Start: 8003 Anesthesia Stop: 4917    Procedure: STAGE 1 INTERSTIM (N/A Back) Diagnosis: (URINARY FREQUENCY)    Surgeons: Kennith Maes, MD Responsible Provider: Elesa Massed, MD    Anesthesia Type: MAC ASA Status: 4          Anesthesia Type: MAC    Aldrete Phase I: Aldrete Score: 10    Aldrete Phase II: Aldrete Score: 10    Last vitals: Reviewed and per EMR flowsheets.       Anesthesia Post Evaluation    Patient location during evaluation: bedside  Patient participation: complete - patient participated  Level of consciousness: awake  Airway patency: patent  Nausea & Vomiting: no vomiting and no nausea  Cardiovascular status: hemodynamically stable  Respiratory status: acceptable  Hydration status: stable

## 2020-02-17 ENCOUNTER — Encounter: Admit: 2020-02-17 | Primary: Family Medicine

## 2020-02-17 DIAGNOSIS — R350 Frequency of micturition: Secondary | ICD-10-CM

## 2020-02-18 ENCOUNTER — Ambulatory Visit: Admit: 2020-02-18 | Discharge: 2020-02-18 | Payer: MEDICARE | Attending: Registered Nurse | Primary: Family Medicine

## 2020-02-18 DIAGNOSIS — R350 Frequency of micturition: Secondary | ICD-10-CM

## 2020-02-18 LAB — CULTURE, URINE

## 2020-02-18 LAB — POST VOID RESIDUAL (PVR): post void residual: 97 ml

## 2020-02-18 NOTE — Patient Instructions (Signed)
Try probiotic over the counter such as culturelle or lactobacillus.

## 2020-02-18 NOTE — Progress Notes (Signed)
[facility] [place] [facility]  [facility]  [address]  [address]  [place] [address]  Dept: [phone]  Loc: [phone]    Visit Date: 02/18/2020        HPI:     Michelle Bright is a 70 y.o. female who presents today for:  Chief Complaint   Patient presents with   ??? Follow-up     Interstim Sacral Neuromodulation System Placement, Stage 1 on 02/11/20   ??? Urinary Frequency     OAB       HPI   Pt seen in follow up after interstim stage I.     Pt has a hx of recurrent UTIs previously treated with D mannose. ??Pt previously tried oxybutynin, trospium, and Myrbetriq for her OAB without improvement.  Underwent interstim stage I by Dr. Welton Flakes 02/11/20.      Reports urinary symptoms improved.  Now having incontinence only occasional--small amount every other day.  Markedly improved per pt and husband.  Was previously using multiple pads/briefs per day.  Has only used 4 briefs in the last week. Nocturia down to 2 x per night.     Notes having 2 BMs per day now was 4 right after surgery.  Improving.  Was on Keflex for 5 days and completed course Monday.    Current Outpatient Medications   Medication Sig Dispense Refill   ??? Cholecalciferol (VITAMIN D3) 50 MCG (2000 UT) CAPS Take by mouth     ??? dilTIAZem (CARDIZEM) 30 MG tablet Take 30 mg by mouth 2 times daily     ??? Ascorbic Acid (VITAMIN C) 500 MG CHEW Take 500 mg by mouth daily     ??? metoprolol succinate (TOPROL XL) 50 MG extended release tablet Take 50 mg by mouth daily     ??? furosemide (LASIX) 20 MG tablet 20 mg Patient taking 1/2 tablet to 1 tablet daily     ??? potassium chloride (KLOR-CON) 10 MEQ extended release tablet      ??? lisinopril (PRINIVIL;ZESTRIL) 20 MG tablet Take 20 mg by mouth daily     ??? atorvastatin (LIPITOR) 40 MG tablet Take 40 mg by mouth daily     ??? nitroGLYCERIN (NITROSTAT) 0.4 MG SL tablet up to max of 3 total doses. If no relief after 1 dose, call 911. 25 tablet 3   ??? acetaminophen (TYLENOL) 500 MG tablet Take 500 mg  by mouth every 6 hours as needed for Pain     ??? warfarin (COUMADIN) 3 MG tablet Take 3 mg by mouth daily      ??? levothyroxine (SYNTHROID) 100 MCG tablet Take 100 mcg by mouth Daily      ??? aspirin 81 MG tablet Take 81 mg by mouth daily     ??? IRON PO Take 65 mg by mouth 2 times daily     ??? Calcium Carb-Cholecalciferol (CALCIUM + D3 PO) Take 1,200 mg by mouth 2 times daily     ??? flecainide (TAMBOCOR) 100 MG tablet Take 50 mg by mouth 2 times daily     ??? D-Mannose 500 MG CAPS Take by mouth 2 times daily  (Patient not taking: Reported on 02/18/2020)     ??? solifenacin (VESICARE) 10 MG tablet Take 1 tablet by mouth daily (Patient not taking: Reported on 02/18/2020) 30 tablet 2     No current facility-administered medications for this visit.       Past Medical History  Michelle Bright  has  a past medical history of Arthritis, Atrial fibrillation (HCC), CAD (coronary artery disease), Cerebral artery occlusion with cerebral infarction [facility]), Hypertension, Osteoporosis, and Thyroid disease.    Past Surgical History  The patient  has a past surgical history that includes Femur Surgery (12/2014); Thyroid surgery (2005); fracture surgery; and Stimulator Surgery (N/A, 02/11/2020).    Family History  This patient's family history is not on file.    Social History  Michelle Bright  reports that she quit smoking about 27 years ago. She has never used smokeless tobacco. She reports previous alcohol use. She reports that she does not use drugs.      Subjective:      Review of Systems   Constitutional: Negative for activity change, appetite change, chills, diaphoresis, fatigue, fever and unexpected weight change.   Gastrointestinal: Negative for abdominal pain, nausea and vomiting.   Genitourinary: Negative for decreased urine volume, difficulty urinating, dysuria, flank pain, frequency, hematuria and urgency.   Musculoskeletal: Negative for back pain.       Objective:   BP 130/82    Ht 4\' 11"  (1.499 m)    Wt 102 lb (46.3 kg)    BMI 20.60 kg/m??      Physical Exam  Vitals reviewed.   Constitutional:       General: She is not in acute distress.     Appearance: Normal appearance. She is well-developed. She is not ill-appearing or diaphoretic.   HENT:      Head: Normocephalic and atraumatic.      Right Ear: External ear normal.      Left Ear: External ear normal.      Nose: Nose normal.      Mouth/Throat:      Mouth: Mucous membranes are moist.   Eyes:      General: No scleral icterus.        Right eye: No discharge.         Left eye: No discharge.   Neck:      Vascular: No JVD.      Trachea: No tracheal deviation.   Pulmonary:      Effort: Pulmonary effort is normal. No respiratory distress.   Abdominal:      General: There is no distension.      Tenderness: There is no abdominal tenderness. There is no right CVA tenderness or left CVA tenderness.   Musculoskeletal:         General: No tenderness. Normal range of motion.   Neurological:      Mental Status: She is alert and oriented to person, place, and time. Mental status is at baseline.   Psychiatric:         Mood and Affect: Mood normal.         Behavior: Behavior normal.         Thought Content: Thought content normal.         POC  Results for POC orders placed in visit on 02/18/20   poct post void residual   Result Value Ref Range    post void residual 97 ml         Patients recent PSA values are as follows  No results found for: PSA, PSADIA     Recent BUN/Creatinine:  Lab Results   Component Value Date    BUN 19 01/28/2020    CREATININE 0.7 01/28/2020       Assessment:   Recurrent UTIs  OAB  Nocturia    Plan:     Pt  and husband report symptoms 75-80% improved.  Proceed with interstim stage II with Dr. Welton Flakes.      Had some increased stooling while on antibiotics.  Improving.  Start over the counter probiotic.

## 2020-02-19 NOTE — Progress Notes (Signed)
Follow all instructions given by your physician    NPO after midnight   Sips of water am of surgery with allowed medications  Bring insurance info and driver's license  Wear comfortable clean clothing  No jewelry or contact lenses to be worn day of surgery  No glue on dentures morning of surgery;you will be asked to remove them for surgery. Case for glasses.  Shower night before and morning of surgery with a liquid antibacterial soap, dry with fresh clean towel; no lotions, creams or powder.  Clean sheets and pillow case on bed night before surgery  Bring medications in original bottles  Bring CPAP/BIPAP machine if you have one ( you may be charged if one is needed in recovery room )  Driver needed at discharge and someone over 18 to stay with you for 24 hours overnight (surgery may be cancelled if you don't have this)  Report to [facility] on 2nd floor  If you would become ill prior to surgery, please call the surgeon  May have a visitor with you, we request that you limit to 2 visitors in pre-op area  Please bring and wear mask  Call [facility] [phone] for any questions  Covid questionnaire Complete; Patient negative for symptoms or exposure. See documentation.

## 2020-02-20 NOTE — Telephone Encounter (Signed)
[place] on [employer] advised of the urine results and no need for antibiotics at this time.

## 2020-02-20 NOTE — Telephone Encounter (Signed)
-----   Message from Helane Rima, MD sent at 02/19/2020  8:14 PM EST -----  No need to treat

## 2020-02-24 NOTE — Op Note (Signed)
Patient:  Michelle Bright  MRN: [id number]  Date of birth: 04/26/1950    FACILITY: [facility]    DATE: 02/25/2020    SURGEON: Orson Slick, MD     ASSISTANT: none    PREOPERATIVE DIAGNOSIS: URINARY FREQUENCY, oab , uui    POSTOPERATIVE DIAGNOSIS: as above    PROCEDURE PERFORMED:     1. Interstim Sacral Neuromodulation System Placement, Stage 2    ANESTHESIA: General    ESTIMATED BLOOD LOSS: 0 ml    COMPLICATIONS: None immediate    DRAINS: none    SPECIMENS: none    INDICATIONS FOR PROCEDURE:  The patient is a 70 y.o. female who presents with urinary frequency, urgency, and urge incontinence which was refractory to anticholinergic medications and behavioral modifications. Two weeks ago she underwent InterStim stage 1 placement and over the last 2 weeks has experienced greater than 50% improvement in symptoms. she presents today for 2nd stage placement of permanent stimulator. The risks and benefits of the procedure as well as possible alternatives and complications were discussed and she consented.     Narrative of the Procedure:    After informed consent was obtained, the patient was brought to the operating room and placed on the operating table in the prone position. Anesthesia was induced and antibiotics were given. The patient was prepped and draped in a sterile manner. To prevent contamination, the wire extension was left hanging off the patient on the side contralateral to the pocket. The wire overlying the surgical site was thoroughly cleaned. A timeout occurred in which two patient identifiers were used. The scar overlying the site of previous connection was located and anesthetized. Using a 15-blade scalpel a skin incision was made over the scar. Blunt dissection was used to develop a pocket. The connection was identified and delivered through the incision. The wire extension was transected at the level of the patient's skin and removed from under the drape. The prolene sutures surrounding  the boot were incised and removed. The boot was retracted and the quick-connection wrench was used to loosen the screws. The boot and wire extension were removed intact and discarded. The lead was cleaned and the battery was attached using the quick-connection wrench. Care was taken to ensure the blue electrode tip was seen through the distal window on the battery. The battery was placed into the wound. The device was then interrogated. All resistances were within normal limits. The dermis was re-approximated with 2-0 Vicryl. The skin was closed with a 4-0 Monocryl in a subcuticular manner. The patient was then awakened and transferred to the PACU in good and stable condition.    Follow-Up: The patient will be discharged on 5 days of Keflex.     Michelle Bright [name], MD    DISPOSITION:  The patient was discharged home in stable condition with instructions on  the functionality of her device. she was instructed to follow up in clinic in 2-3 weeks to evaluate for continued symptom control. The InterStim rep was present and available for questions.

## 2020-02-24 NOTE — H&P (Signed)
Orson Slick, MD  History and Physical    Patient:  Michelle Bright  MRN: [id number]  Date of birth: 02/02/1950    HISTORY OF PRESENT ILLNESS:     The patient is a 70 y.o. female who presents with oab. Here for procedure.    Patient's old records, notes and chart reviewed and summarized above.     Courtnie Brenes ALI Welton Flakes, MD independently reviewed the images and verified the radiology reports from:    XR CHEST (2 VW)    Result Date: 01/28/2020  PROCEDURE: XR CHEST (2 VW) CLINICAL INFORMATION: Frequency of urination, Pre-op testing. COMPARISON: No prior study. TECHNIQUE: PA and lateral views of the chest were obtained. FINDINGS: The lungs are clear. The lungs are hyperinflated and there is flattening of the bilateral hemidiaphragms, findings likely related to emphysematous changes. There is cardiomegaly. The pulmonary vasculature is within normal limits. Atherosclerotic calcifications are in the arch of the aorta. There is no significant pleural effusion or pneumothorax. Visualized portions of the upper abdomen are within normal limits. The osseous structures are intact. There is generalized osteopenia. There are some degenerative changes noted in the thoracic spine. No acute fractures or suspicious osseous lesions.     Emphysematous changes and cardiomegaly. No acute process. **This report has been created using voice recognition software. It may contain minor errors which are inherent in voice recognition technology.** Final report electronically signed by Dr Anders Grant on 01/28/2020 8:46 AM    MAM TOMO DIGITAL SCREEN SELF REFERRAL W OR WO CAD BILATERAL    Result Date: 01/28/2020  LOCATION: [facility] PROCEDURE: MAM TOMO DIGITAL SCREEN SELF REFERRAL W OR WO CAD BILATERAL CLINICAL INFORMATION: Visit for screening mammogram. Tomosynthesis. CLINICAL:     Self-referred screening mammogram PATIENT MEDICAL HISTORY:  No relevant medical history has been documented for this patient. FAMILY HISTORY:   No relevant family history has been  documented for this patient. RISK VALUES: Tyrer-Cuzick 67yr.: 2.8%, Tyrer-Cuzick life:   4.2% COMPARISON: 12/18/2018, 09/06/2017, 08/25/2016 and 08/24/2015. TECHNIQUE: Bilateral CC and MLO views of the breasts were obtained.  3D tomosynthesis was utilized.  CAD was utilized. BREAST COMPOSITION: The tissue of the breast(s) is heterogeneously dense. This may lower the sensitivity of mammography. FINDINGS:  There are vascular calcifications bilaterally. No significant masses, calcifications, or other findings are seen in the breast(s). There has been no significant interval change.     No mammographic evidence of malignancy. A 1 year screening mammogram is recommended. A result letter will be sent to the patient.  She will also receive a reminder 1 month prior to her next mammogram. . BI-RADS CATEGORY 2: BENIGN FINDINGS. Management Recommendation: Routine annual mammography. **This report has been created using voice recognition software. It may contain minor errors which are inherent in voice recognition technology.** Final report electronically signed by Dr. Jacques Navy on 01/28/2020 11:28 AM        Past Medical History:    Past Medical History:   Diagnosis Date   ??? Arthritis    ??? Atrial fibrillation (HCC)    ??? CAD (coronary artery disease)     Afib    ??? Cerebral artery occlusion with cerebral infarction (HCC)     frequency of urine   ??? Hypertension    ??? Osteoporosis    ??? Thyroid disease     Removed        Past Surgical History:    Past Surgical History:   Procedure Laterality Date   ??? FEMUR SURGERY  12/2014    left femur   ??? FRACTURE SURGERY     ??? STIMULATOR SURGERY N/A 02/11/2020    STAGE 1 INTERSTIM performed by Helane Rima, MD at [facility]   ??? THYROID SURGERY  2005       Medications Prior to Admission:    Prior to Admission medications    Medication Sig Start Date End Date Taking? Authorizing Provider   Cholecalciferol (VITAMIN D3) 50 MCG (2000 UT) CAPS Take by mouth   Yes Historical Provider, MD   D-Mannose 500 MG CAPS  Take by mouth 2 times daily    Yes Historical Provider, MD   dilTIAZem (CARDIZEM) 30 MG tablet Take 30 mg by mouth 2 times daily 08/26/19  Yes Historical Provider, MD   Ascorbic Acid (VITAMIN C) 500 MG CHEW Take 500 mg by mouth daily   Yes Historical Provider, MD   metoprolol succinate (TOPROL XL) 50 MG extended release tablet Take 50 mg by mouth daily   Yes Historical Provider, MD   furosemide (LASIX) 20 MG tablet 20 mg Patient taking 1/2 tablet to 1 tablet daily 08/20/18  Yes Historical Provider, MD   potassium chloride (KLOR-CON) 10 MEQ extended release tablet  07/10/18  Yes Historical Provider, MD   lisinopril (PRINIVIL;ZESTRIL) 20 MG tablet Take 20 mg by mouth daily   Yes Historical Provider, MD   atorvastatin (LIPITOR) 40 MG tablet Take 40 mg by mouth daily   Yes Historical Provider, MD   nitroGLYCERIN (NITROSTAT) 0.4 MG SL tablet up to max of 3 total doses. If no relief after 1 dose, call 911. 04/11/15  Yes Adel Heloise Beecham, MD   acetaminophen (TYLENOL) 500 MG tablet Take 500 mg by mouth every 6 hours as needed for Pain   Yes Historical Provider, MD   warfarin (COUMADIN) 3 MG tablet Take 3 mg by mouth daily    Yes Historical Provider, MD   levothyroxine (SYNTHROID) 100 MCG tablet Take 100 mcg by mouth Daily    Yes Historical Provider, MD   aspirin 81 MG tablet Take 81 mg by mouth daily   Yes Historical Provider, MD   IRON PO Take 65 mg by mouth 2 times daily   Yes Historical Provider, MD   Calcium Carb-Cholecalciferol (CALCIUM + D3 PO) Take 1,200 mg by mouth 2 times daily   Yes Historical Provider, MD   flecainide (TAMBOCOR) 100 MG tablet Take 50 mg by mouth 2 times daily   Yes Historical Provider, MD       Allergies:  Patient has no known allergies.    Social History:    Social History     Socioeconomic History   ??? Marital status: Married     Spouse name: Not on file   ??? Number of children: Not on file   ??? Years of education: Not on file   ??? Highest education level: Not on file   Occupational History   ??? Not on  file   Tobacco Use   ??? Smoking status: Former Smoker     Quit date: 03/09/1992     Years since quitting: 27.9   ??? Smokeless tobacco: Never Used   Vaping Use   ??? Vaping Use: Never used   Substance and Sexual Activity   ??? Alcohol use: Not Currently   ??? Drug use: Never   ??? Sexual activity: Not on file   Other Topics Concern   ??? Not on file   Social History Narrative   ??? Not on  file     Social Determinants of Health     Financial Resource Strain:    ??? Difficulty of Paying Living Expenses: Not on file   Food Insecurity:    ??? Worried About Programme researcher, broadcasting/film/video in the Last Year: Not on file   ??? Ran Out of Food in the Last Year: Not on file   Transportation Needs:    ??? Lack of Transportation (Medical): Not on file   ??? Lack of Transportation (Non-Medical): Not on file   Physical Activity:    ??? Days of Exercise per Week: Not on file   ??? Minutes of Exercise per Session: Not on file   Stress:    ??? Feeling of Stress : Not on file   Social Connections:    ??? Frequency of Communication with Friends and Family: Not on file   ??? Frequency of Social Gatherings with Friends and Family: Not on file   ??? Attends Religious Services: Not on file   ??? Active Member of Clubs or Organizations: Not on file   ??? Attends Banker Meetings: Not on file   ??? Marital Status: Not on file   Intimate Partner Violence:    ??? Fear of Current or Ex-Partner: Not on file   ??? Emotionally Abused: Not on file   ??? Physically Abused: Not on file   ??? Sexually Abused: Not on file   Housing Stability:    ??? Unable to Pay for Housing in the Last Year: Not on file   ??? Number of Places Lived in the Last Year: Not on file   ??? Unstable Housing in the Last Year: Not on file       Family History:  History reviewed. No pertinent family history.    REVIEW OF SYSTEMS:  Constitutional: negative  Eyes: negative  Respiratory: negative  Cardiovascular: negative  Gastrointestinal: negative  Genitourinary: no acute issues  Musculoskeletal: negative  Skin: negative    Neurological: negative  Hematological/Lymphatic: negative  Psychological: negative    Physical Exam:      No data found.  Constitutional: Patient in no acute distress;   Neuro: alert and oriented to person place and time.    Psych: Mood and affect normal.  Skin: Normal  Lungs: Respiratory effort normal, CTA  Cardiovascular:  Normal peripheral pulses; no murmur. Normal rhythm  Abdomen: Soft, non-tender, non-distended with no CVA, flank pain, hepatosplenomegaly or hernia.  Kidneys normal.  Bladder non-tender and not distended.      LABS:   No results for input(s): WBC, HGB, HCT, MCV, PLT in the last 72 hours.  No results for input(s): NA, K, CL, CO2, PHOS, BUN, CREATININE, CA in the last 72 hours.  No results found for: PSA      Urinalysis: No results for input(s): COLORU, PHUR, LABCAST, WBCUA, RBCUA, MUCUS, TRICHOMONAS, YEAST, BACTERIA, CLARITYU, SPECGRAV, LEUKOCYTESUR, UROBILINOGEN, BILIRUBINUR, BLOODU in the last 72 hours.    Invalid input(s): NITRATE, GLUCOSEUKETONESUAMORPHOUS     -----------------------------------------------------------------      Assessment and Plan     Impression:  There is no problem list on file for this patient.      Plan:     Consent obtained; oab 75% improvement. Stage II in OR today    Ovidio Steele ALI Welton Flakes, MD  5:52 PM 02/24/2020

## 2020-02-25 ENCOUNTER — Inpatient Hospital Stay: Payer: MEDICARE

## 2020-02-25 LAB — PROTIME-INR: INR: 1.12 (ref 0.85–1.13)

## 2020-02-25 LAB — POTASSIUM: Potassium: 4.4 meq/L (ref 3.5–5.2)

## 2020-02-25 MED ORDER — PROPOFOL 200 MG/20ML IV EMUL
20020 MG/20ML | INTRAVENOUS | Status: DC | PRN
Start: 2020-02-25 — End: 2020-02-25
  Administered 2020-02-25: 13:00:00 60 via INTRAVENOUS

## 2020-02-25 MED ORDER — CEFAZOLIN 2000 MG D5W 50 ML IVPB
Status: AC
Start: 2020-02-25 — End: 2020-02-25
  Administered 2020-02-25: 12:00:00 via INTRAVENOUS

## 2020-02-25 MED ORDER — BUPIVACAINE HCL (PF) 0.5 % IJ SOLN
0.5 | INTRAMUSCULAR | Status: AC
Start: 2020-02-25 — End: 2020-02-25

## 2020-02-25 MED ORDER — PROPOFOL 200 MG/20ML IV EMUL
200 | INTRAVENOUS | Status: AC
Start: 2020-02-25 — End: 2020-02-25

## 2020-02-25 MED ORDER — SODIUM CHLORIDE 0.9 % IV SOLN
0.9 % | INTRAVENOUS | Status: DC
Start: 2020-02-25 — End: 2020-02-25

## 2020-02-25 MED ORDER — LIDOCAINE HCL 100 MG/5ML IJ SOSY
100 MG/5ML | INTRAMUSCULAR | Status: DC | PRN
Start: 2020-02-25 — End: 2020-02-25
  Administered 2020-02-25: 13:00:00 60 via INTRAVENOUS

## 2020-02-25 MED ORDER — FENTANYL CITRATE (PF) 100 MCG/2ML IJ SOLN
100 MCG/2ML | INTRAMUSCULAR | Status: DC | PRN
Start: 2020-02-25 — End: 2020-02-25
  Administered 2020-02-25: 13:00:00 50 via INTRAVENOUS

## 2020-02-25 MED ORDER — ONDANSETRON HCL 4 MG/2ML IJ SOLN
4 MG/2ML | INTRAMUSCULAR | Status: DC | PRN
Start: 2020-02-25 — End: 2020-02-25
  Administered 2020-02-25: 13:00:00 4 via INTRAVENOUS

## 2020-02-25 MED ORDER — CEFAZOLIN 2000 MG D5W 50 ML IVPB
Status: DC | PRN
Start: 2020-02-25 — End: 2020-02-25
  Administered 2020-02-25: 13:00:00 2000 via INTRAVENOUS

## 2020-02-25 MED ORDER — SODIUM CHLORIDE 0.9 % IV SOLN
0.9 % | INTRAVENOUS | Status: DC
Start: 2020-02-25 — End: 2020-02-25
  Administered 2020-02-25: 12:00:00 via INTRAVENOUS

## 2020-02-25 MED ORDER — FENTANYL CITRATE (PF) 100 MCG/2ML IJ SOLN
100 | INTRAMUSCULAR | Status: AC
Start: 2020-02-25 — End: 2020-02-25

## 2020-02-25 MED ORDER — BUPIVACAINE HCL (PF) 0.5 % IJ SOLN
0.5 % | INTRAMUSCULAR | Status: DC | PRN
Start: 2020-02-25 — End: 2020-02-25
  Administered 2020-02-25: 13:00:00 10 via INTRADERMAL

## 2020-02-25 MED ORDER — ROCURONIUM BROMIDE 50 MG/5ML IV SOLN
50 | INTRAVENOUS | Status: AC
Start: 2020-02-25 — End: 2020-02-25

## 2020-02-25 MED ORDER — SODIUM CHLORIDE 0.9 % IV SOLN
0.9 % | INTRAVENOUS | Status: DC | PRN
Start: 2020-02-25 — End: 2020-02-25
  Administered 2020-02-25: 13:00:00 via INTRAVENOUS

## 2020-02-25 MED ORDER — CEFAZOLIN 2000 MG D5W 50 ML IVPB
Status: DC
Start: 2020-02-25 — End: 2020-02-25

## 2020-02-25 MED ORDER — LIDOCAINE HCL 100 MG/5ML IJ SOSY
100 | INTRAMUSCULAR | Status: AC
Start: 2020-02-25 — End: 2020-02-25

## 2020-02-25 MED ORDER — ONDANSETRON HCL 4 MG/2ML IJ SOLN
4 | INTRAMUSCULAR | Status: AC
Start: 2020-02-25 — End: 2020-02-25

## 2020-02-25 MED ORDER — CEPHALEXIN 500 MG PO CAPS
500 MG | ORAL_CAPSULE | Freq: Three times a day (TID) | ORAL | 0 refills | Status: AC
Start: 2020-02-25 — End: 2020-03-01

## 2020-02-25 MED FILL — ROCURONIUM BROMIDE 50 MG/5ML IV SOLN: 50 MG/5ML | INTRAVENOUS | Qty: 5

## 2020-02-25 MED FILL — ONDANSETRON HCL 4 MG/2ML IJ SOLN: 4 MG/2ML | INTRAMUSCULAR | Qty: 2

## 2020-02-25 MED FILL — MARCAINE PRESERVATIVE FREE 0.5 % IJ SOLN: 0.5 % | INTRAMUSCULAR | Qty: 30

## 2020-02-25 MED FILL — CEFAZOLIN 2000 MG D5W 50 ML IVPB: Qty: 50

## 2020-02-25 MED FILL — LIDOCAINE HCL 100 MG/5ML IJ SOSY: 100 MG/5ML | INTRAMUSCULAR | Qty: 5

## 2020-02-25 MED FILL — PROPOFOL 200 MG/20ML IV EMUL: 200 MG/20ML | INTRAVENOUS | Qty: 20

## 2020-02-25 MED FILL — FENTANYL CITRATE (PF) 100 MCG/2ML IJ SOLN: 100 MCG/2ML | INTRAMUSCULAR | Qty: 2

## 2020-02-25 NOTE — Anesthesia Pre-Procedure Evaluation (Signed)
Department of Anesthesiology  Preprocedure Note       Name:  Michelle Bright   Age:  70 y.o.  DOB:  May 20, 1950                                          MRN:  [id number]         Date:  02/25/2020      Surgeon: Juliann Mule):  Kennith Maes, MD    Procedure: Procedure(s):  STAGE 2 STIMULATOR INSERTION    Medications prior to admission:   Prior to Admission medications    Medication Sig Start Date End Date Taking? Authorizing Provider   Cholecalciferol (VITAMIN D3) 50 MCG (2000 UT) CAPS Take by mouth    Historical Provider, MD   D-Mannose 500 MG CAPS Take by mouth 2 times daily     Historical Provider, MD   dilTIAZem (CARDIZEM) 30 MG tablet Take 30 mg by mouth 2 times daily 08/26/19   Historical Provider, MD   Ascorbic Acid (VITAMIN C) 500 MG CHEW Take 500 mg by mouth daily    Historical Provider, MD   metoprolol succinate (TOPROL XL) 50 MG extended release tablet Take 50 mg by mouth daily    Historical Provider, MD   furosemide (LASIX) 20 MG tablet 20 mg Patient taking 1/2 tablet to 1 tablet daily 08/20/18   Historical Provider, MD   potassium chloride (KLOR-CON) 10 MEQ extended release tablet  07/10/18   Historical Provider, MD   lisinopril (PRINIVIL;ZESTRIL) 20 MG tablet Take 20 mg by mouth daily    Historical Provider, MD   atorvastatin (LIPITOR) 40 MG tablet Take 40 mg by mouth daily    Historical Provider, MD   nitroGLYCERIN (NITROSTAT) 0.4 MG SL tablet up to max of 3 total doses. If no relief after 1 dose, call 911. 04/11/15   Adel Tyson Dense, MD   acetaminophen (TYLENOL) 500 MG tablet Take 500 mg by mouth every 6 hours as needed for Pain    Historical Provider, MD   warfarin (COUMADIN) 3 MG tablet Take 3 mg by mouth daily     Historical Provider, MD   levothyroxine (SYNTHROID) 100 MCG tablet Take 100 mcg by mouth Daily     Historical Provider, MD   aspirin 81 MG tablet Take 81 mg by mouth daily    Historical Provider, MD   IRON PO Take 65 mg by mouth 2 times daily    Historical Provider, MD   Calcium Carb-Cholecalciferol  (CALCIUM + D3 PO) Take 1,200 mg by mouth 2 times daily    Historical Provider, MD   flecainide (TAMBOCOR) 100 MG tablet Take 50 mg by mouth 2 times daily    Historical Provider, MD       Current medications:    No current facility-administered medications for this visit.     No current outpatient medications on file.     Facility-Administered Medications Ordered in Other Visits   Medication Dose Route Frequency Provider Last Rate Last Admin   ??? 0.9 % sodium chloride infusion   IntraVENous Continuous Kennith Maes, MD       ??? ceFAZolin (ANCEF) 2000 mg in dextrose 5 % 50 mL IVPB  2,000 mg IntraVENous 30 Min Pre-Op Kennith Maes, MD           Allergies:  No Known Allergies    Problem List:  There is no  problem list on file for this patient.      Past Medical History:        Diagnosis Date   ??? Arthritis    ??? Atrial fibrillation ([facility])    ??? CAD (coronary artery disease)     Afib    ??? Cerebral artery occlusion with cerebral infarction (HCC)     frequency of urine   ??? Hypertension    ??? Osteoporosis    ??? Thyroid disease     Removed        Past Surgical History:        Procedure Laterality Date   ??? FEMUR SURGERY  12/2014    left femur   ??? FRACTURE SURGERY     ??? STIMULATOR SURGERY N/A 02/11/2020    STAGE 1 INTERSTIM performed by Kennith Maes, MD at [facility]   ??? THYROID SURGERY  2005       Social History:    Social History     Tobacco Use   ??? Smoking status: Former Smoker     Quit date: 03/09/1992     Years since quitting: 27.9   ??? Smokeless tobacco: Never Used   Substance Use Topics   ??? Alcohol use: Not Currently                                Counseling given: Not Answered      Vital Signs (Current):   There were no vitals filed for this visit.                                           BP Readings from Last 3 Encounters:   02/25/20 (!) 163/75   02/18/20 130/82   02/11/20 (!) 168/77       NPO Status:                                                                                 BMI:   Wt Readings from Last 3 Encounters:   02/25/20 99 lb  6.4 oz (45.1 kg)   02/18/20 102 lb (46.3 kg)   02/11/20 103 lb (46.7 kg)     There is no height or weight on file to calculate BMI.    CBC:   Lab Results   Component Value Date    WBC 11.6 01/28/2020    RBC 4.84 01/28/2020    RBC 17 01/21/2020    HGB 15.4 01/28/2020    HCT 49.6 01/28/2020    MCV 102.5 01/28/2020    RDW 15.2 04/11/2015    PLT 177 01/28/2020       CMP:   Lab Results   Component Value Date    NA 146 01/28/2020    K 4.7 01/28/2020    CL 103 01/28/2020    CO2 28 01/28/2020    BUN 19 01/28/2020    CREATININE 0.7 01/28/2020    LABGLOM 82 01/28/2020    GLUCOSE 115 01/28/2020    PROT 7.2 04/11/2015    CALCIUM 9.5  01/28/2020    BILITOT Negative 01/21/2020    ALKPHOS 85 04/11/2015    AST 17 04/11/2015    ALT 12 04/11/2015       POC Tests: No results for input(s): POCGLU, POCNA, POCK, POCCL, POCBUN, POCHEMO, POCHCT in the last 72 hours.    Coags:   Lab Results   Component Value Date    INR 1.12 02/25/2020       HCG (If Applicable): No results found for: PREGTESTUR, PREGSERUM, HCG, HCGQUANT     ABGs: No results found for: PHART, PO2ART, PCO2ART, HCO3ART, BEART, O2SATART     Type & Screen (If Applicable):  No results found for: LABABO, LABRH    Drug/Infectious Status (If Applicable):  No results found for: HIV, HEPCAB    COVID-19 Screening (If Applicable): No results found for: COVID19        Anesthesia Evaluation    Airway: Mallampati: II  TM distance: >3 FB   Neck ROM: full  Mouth opening: > = 3 FB Dental:          Pulmonary:   (+) decreased breath sounds,                             Cardiovascular:    (+) hypertension:, CAD:, dysrhythmias: atrial flutter,         Rhythm: regular                      Neuro/Psych:   (+) CVA:,             GI/Hepatic/Renal:             Endo/Other:                     Abdominal:             Vascular:          Other Findings:               Anesthesia Plan      MAC     ASA 3       Induction: intravenous.    MIPS: Prophylactic antiemetics administered.  Anesthetic plan and risks  discussed with patient.      Plan discussed with CRNA.                  Marge Duncans, MD   02/25/2020

## 2020-02-25 NOTE — Progress Notes (Signed)
Pt has met discharge criteria and states she is ready for discharge to home. IV removed, gauze and tape applied. Dressed in own clothes and personal belongings gathered. Discharge instructions given to pt and family; pt and family verbalized understanding of discharge instructions, prescriptions and follow up appointments. Pt transported to discharge lobby by [facility] staff.

## 2020-02-25 NOTE — Anesthesia Post-Procedure Evaluation (Signed)
Department of Anesthesiology  Postprocedure Note    Patient: Michelle Bright  MRN: [id number]  Birthdate: [birth year]  Date of evaluation: 02/25/2020  Time:  8:20 AM     Procedure Summary     Date: 02/25/20 Room / Location: [facility] / [facility]    Anesthesia Start: 0745 Anesthesia Stop: 0820    Procedure: STAGE 2 STIMULATOR INSERTION (N/A Back) Diagnosis: (URINARY FREQUENCY)    Surgeons: Kennith Maes, MD Responsible Provider: Marge Duncans, MD    Anesthesia Type: MAC ASA Status: 3          Anesthesia Type: MAC    Aldrete Phase I: Aldrete Score: 10    Aldrete Phase II:      Last vitals: Reviewed and per EMR flowsheets.       Anesthesia Post Evaluation    Patient location during evaluation: bedside  Patient participation: complete - patient participated  Level of consciousness: awake and alert  Pain score: 0  Airway patency: patent  Nausea & Vomiting: no nausea and no vomiting  Complications: no  Cardiovascular status: hemodynamically stable  Respiratory status: acceptable  Hydration status: stable

## 2020-02-25 NOTE — Progress Notes (Signed)
Returned from surgery family at bedside. Tolerated water and muffin given.

## 2020-02-25 NOTE — Discharge Instructions (Signed)
Pt ok to discharge home in good condition  No heavy lifting, >10 lbs for today  Pt should avoid strenuous activity for today  Pt should walk moderately at home  Pt ok to shower   Pt may resume diet as tolerated  Pt should take Rx as directed  No driving while on narcotics  Please call attending physician or hospital operator with questions  Call or Present to ED if fever (> 101F), intractable nausea vomiting or pain.  Rx in chart    Pt should follow up with Jahlen Bollman ALI Sirenity Shew, MD, in 6 weeks, call to confirm appointment      Restart blood thinners in 72 hours

## 2020-02-25 NOTE — Progress Notes (Signed)
Pt and family oriented to SDS 5 and unit.  Pt verbalized approval for first name, last initial and physician name on unit whiteboard.Fall band applied. Vaccine information given.  Plan of care reviewed.

## 2020-03-10 ENCOUNTER — Encounter: Attending: Urology | Primary: Family Medicine

## 2020-04-10 ENCOUNTER — Ambulatory Visit: Admit: 2020-04-10 | Discharge: 2020-04-10 | Payer: MEDICARE | Attending: Registered Nurse | Primary: Family Medicine

## 2020-04-10 DIAGNOSIS — R350 Frequency of micturition: Secondary | ICD-10-CM

## 2020-04-10 LAB — POCT URINALYSIS DIPSTICK W/O MICROSCOPE (AUTO): Nitrite, UA: NEGATIVE

## 2020-04-10 LAB — POST VOID RESIDUAL (PVR): post void residual: 6 ml

## 2020-04-10 NOTE — Progress Notes (Signed)
[facility] [place] [facility]   [facility]  [address]  [address]  [place] [address]  Dept: [phone]  Loc: [phone]    Visit Date: 04/10/2020        HPI:     Michelle Bright is a 70 y.o. female who presents today for:  Chief Complaint   Patient presents with   ??? Follow-up     interstim        HPI   Pt seen in follow up after interstim stage II.      Pt has a hx of recurrent UTIs previously treated with D mannose. ??Pt previously tried oxybutynin, trospium, and Myrbetriq for her OAB without improvement.  Underwent interstim stage I by Dr. Welton Flakes 02/11/20 and stage II placement 02/25/20.    ??  Reports urinary symptoms improved.  Now having incontinence only occasional.  Markedly improved per pt and husband.  Nocturia down to 2 x per night. Now only wearing a pad just in case.  Very happy with symptoms.     Current Outpatient Medications   Medication Sig Dispense Refill   ??? Cholecalciferol (VITAMIN D3) 50 MCG (2000 UT) CAPS Take by mouth     ??? D-Mannose 500 MG CAPS Take by mouth 2 times daily      ??? dilTIAZem (CARDIZEM) 30 MG tablet Take 30 mg by mouth 2 times daily     ??? Ascorbic Acid (VITAMIN C) 500 MG CHEW Take 500 mg by mouth daily     ??? metoprolol succinate (TOPROL XL) 50 MG extended release tablet Take 50 mg by mouth daily     ??? furosemide (LASIX) 20 MG tablet 20 mg Patient taking 1/2 tablet to 1 tablet daily     ??? potassium chloride (KLOR-CON) 10 MEQ extended release tablet      ??? lisinopril (PRINIVIL;ZESTRIL) 20 MG tablet Take 20 mg by mouth daily     ??? atorvastatin (LIPITOR) 40 MG tablet Take 40 mg by mouth daily     ??? nitroGLYCERIN (NITROSTAT) 0.4 MG SL tablet up to max of 3 total doses. If no relief after 1 dose, call 911. 25 tablet 3   ??? acetaminophen (TYLENOL) 500 MG tablet Take 500 mg by mouth every 6 hours as needed for Pain     ??? warfarin (COUMADIN) 3 MG tablet Take 3 mg by mouth daily      ??? levothyroxine (SYNTHROID) 100 MCG tablet Take 100 mcg by mouth Daily      ???  aspirin 81 MG tablet Take 81 mg by mouth daily     ??? IRON PO Take 65 mg by mouth 2 times daily     ??? Calcium Carb-Cholecalciferol (CALCIUM + D3 PO) Take 1,200 mg by mouth 2 times daily     ??? flecainide (TAMBOCOR) 100 MG tablet Take 50 mg by mouth 2 times daily       No current facility-administered medications for this visit.       Past Medical History  Crislyn  has a past medical history of Arthritis, Atrial fibrillation (HCC), CAD (coronary artery disease), Cerebral artery occlusion with cerebral infarction [facility]), Hypertension, Osteoporosis, and Thyroid disease.    Past Surgical History  The patient  has a past surgical history that includes Femur Surgery (12/2014); Thyroid surgery (2005); fracture surgery; Stimulator Surgery (N/A, 02/11/2020); and Stimulator Surgery (N/A, 02/25/2020).    Family History  This patient's family history is not on file.  Social History  Michelle Bright  reports that she quit smoking about 28 years ago. She has never used smokeless tobacco. She reports previous alcohol use. She reports that she does not use drugs.      Subjective:      Review of Systems   Constitutional: Negative for activity change, appetite change, chills, diaphoresis, fatigue, fever and unexpected weight change.   Gastrointestinal: Negative for abdominal pain, nausea and vomiting.   Genitourinary: Negative for decreased urine volume, difficulty urinating, dysuria, flank pain, frequency, hematuria and urgency.   Musculoskeletal: Negative for back pain.       Objective:   BP 124/70    Ht 4\' 11"  (1.499 m)    Wt 113 lb (51.3 kg)    BMI 22.82 kg/m??     Physical Exam  Vitals reviewed.   Constitutional:       General: She is not in acute distress.     Appearance: Normal appearance. She is well-developed. She is not ill-appearing or diaphoretic.   HENT:      Head: Normocephalic and atraumatic.      Right Ear: External ear normal.      Left Ear: External ear normal.      Nose: Nose normal.      Mouth/Throat:      Mouth: Mucous  membranes are moist.   Eyes:      General: No scleral icterus.        Right eye: No discharge.         Left eye: No discharge.   Neck:      Vascular: No JVD.      Trachea: No tracheal deviation.   Pulmonary:      Effort: Pulmonary effort is normal. No respiratory distress.   Abdominal:      General: There is no distension.      Tenderness: There is no abdominal tenderness. There is no right CVA tenderness or left CVA tenderness.   Musculoskeletal:         General: No tenderness. Normal range of motion.   Skin:     General: Skin is warm and dry.      Comments: Incision with edges well approximated and healed.  No drainage, swelling, erythema   Neurological:      Mental Status: She is alert and oriented to person, place, and time. Mental status is at baseline.   Psychiatric:         Mood and Affect: Mood normal.         Behavior: Behavior normal.         Thought Content: Thought content normal.         POC  Results for POC orders placed in visit on 04/10/20   poct post void residual   Result Value Ref Range    post void residual 6 ml         Patients recent PSA values are as follows  No results found for: PSA, PSADIA     Recent BUN/Creatinine:  Lab Results   Component Value Date    BUN 19 01/28/2020    CREATININE 0.7 01/28/2020       Assessment:   Recurrent UTIs  OAB  Nocturia    Plan:     Pt is doing well with control of urinary symptoms after placement of interstim.   Pleased with improvement.      F/u in 3 months.  Bring charged remote to appt.

## 2020-05-12 IMAGING — MR MRI HIP RT WO CONTRAST
5 series · 35 of 40 positions shown · non-contrast
Comparison: None.

INDICATION: Right hip pain.
TECHNIQUE: Multiplanar, multiecho imaging of the right hip was performed, including T1-weighted and fluid sensitive sequences without intravenous contrast.

[Series 2: t1_axial · axial · 4.0mm · 0.62mm/px · z∈[-26,+144]mm · 11 of 35 slices shown]
[im 1/35]
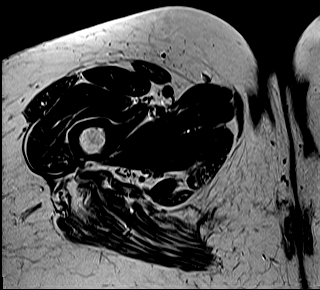
[im 4/35]
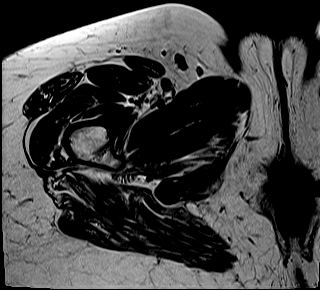
[im 7/35]
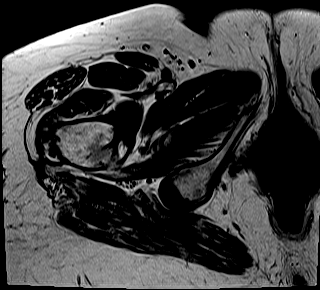
[im 11/35]
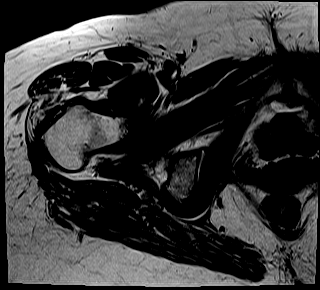
[im 14/35]
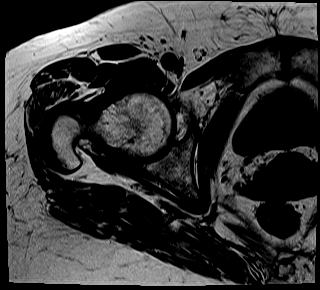
[im 18/35]
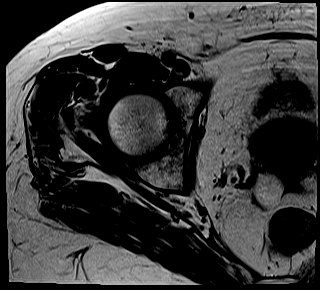
[im 21/35]
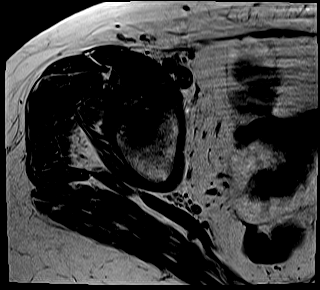
[im 24/35]
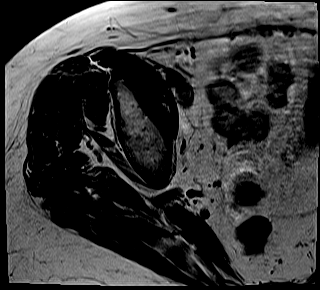
[im 28/35]
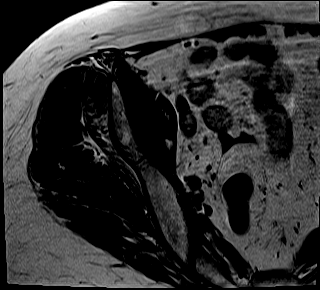
[im 31/35]
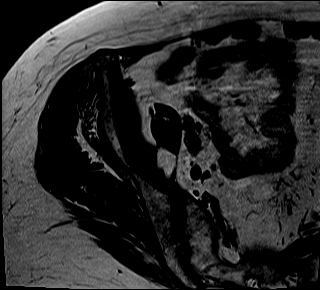
[im 35/35]
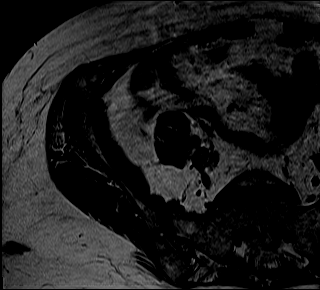

[Series 3: t2_axial_fs · axial · 4.0mm · 0.78mm/px · z∈[-26,+144]mm · 8 of 35 slices shown]
[im 1/35]
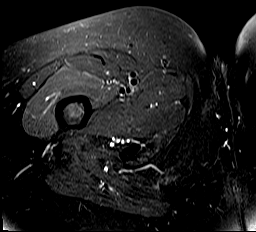
[im 4/35]
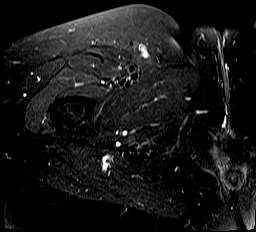
[im 12/35]
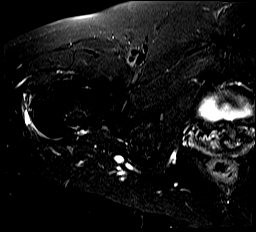
[im 16/35]
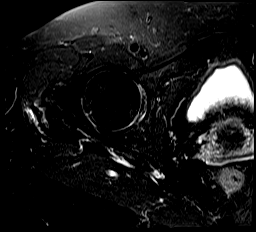
[im 19/35]
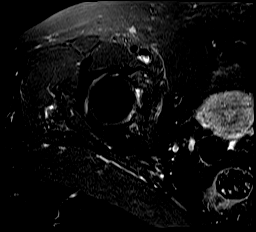
[im 23/35]
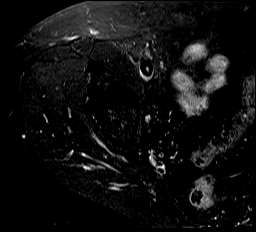
[im 31/35]
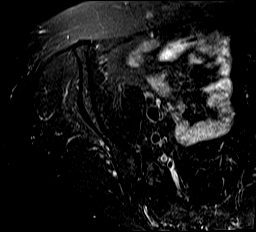
[im 35/35]
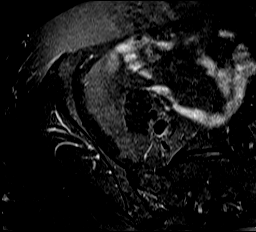

[Series 4: t1_cor · coronal · 4.0mm · 0.66mm/px · 7 of 24 slices shown]
[im 1/24]
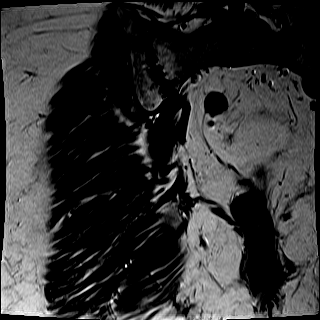
[im 4/24]
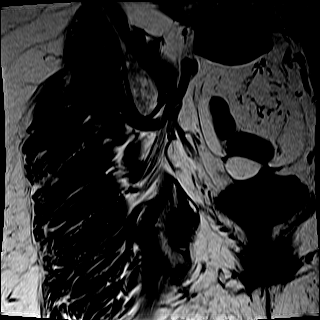
[im 8/24]
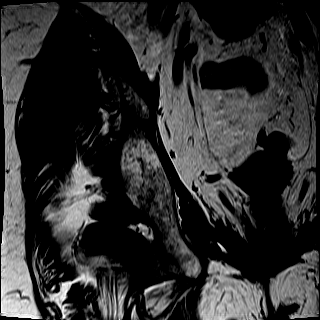
[im 12/24]
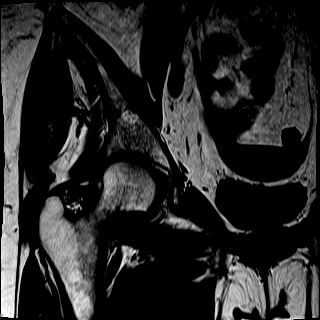
[im 16/24]
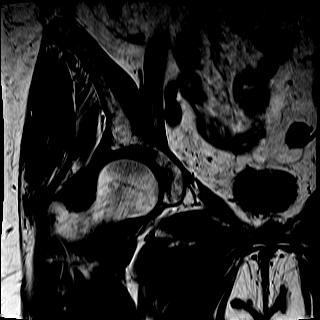
[im 20/24]
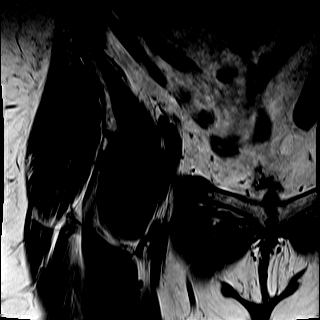
[im 24/24]
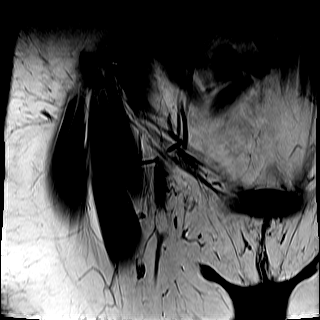

[Series 5: t2_cor_fs · coronal · 4.0mm · 0.41mm/px · 7 of 24 slices shown]
[im 1/24]
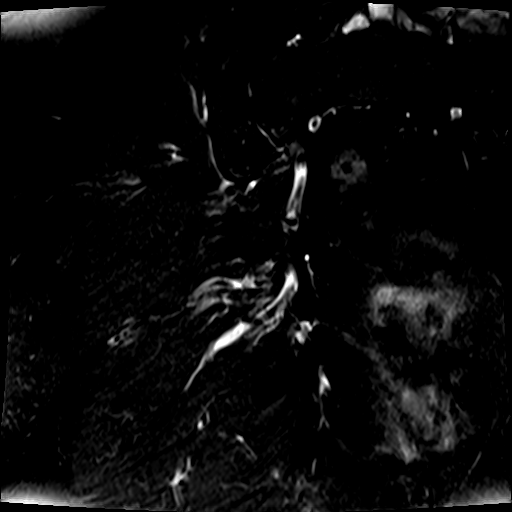
[im 4/24]
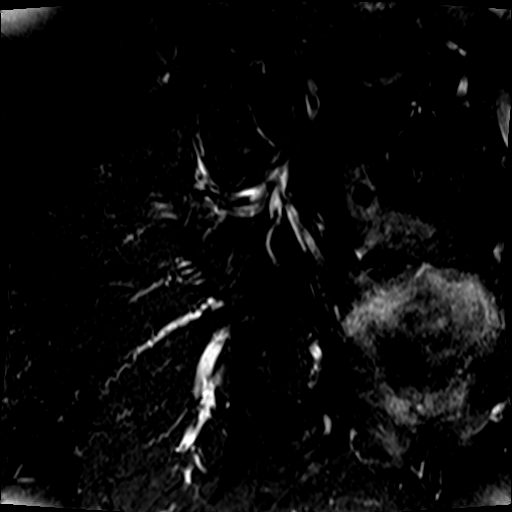
[im 8/24]
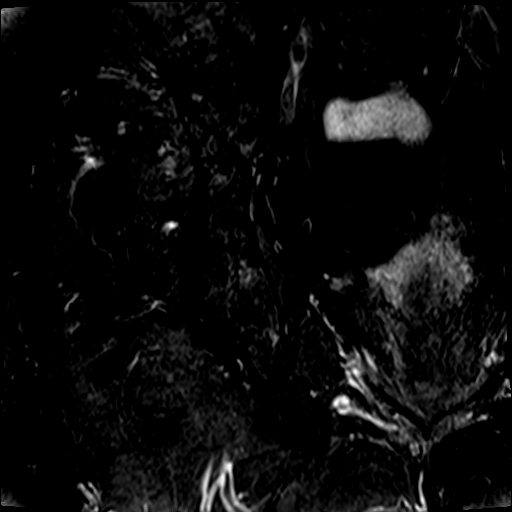
[im 12/24]
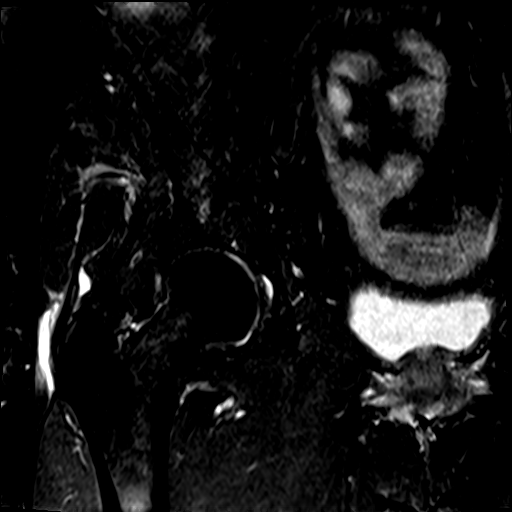
[im 16/24]
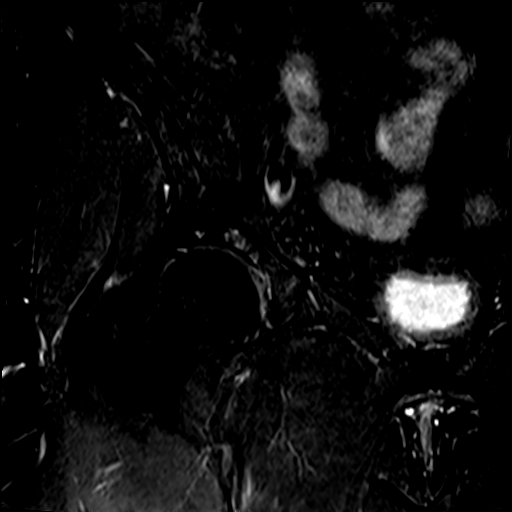
[im 20/24]
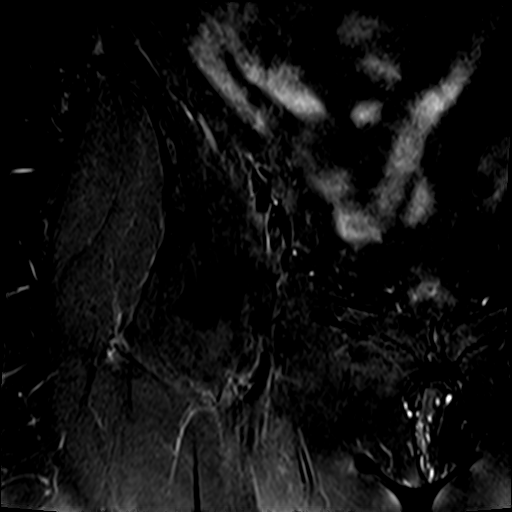
[im 24/24]
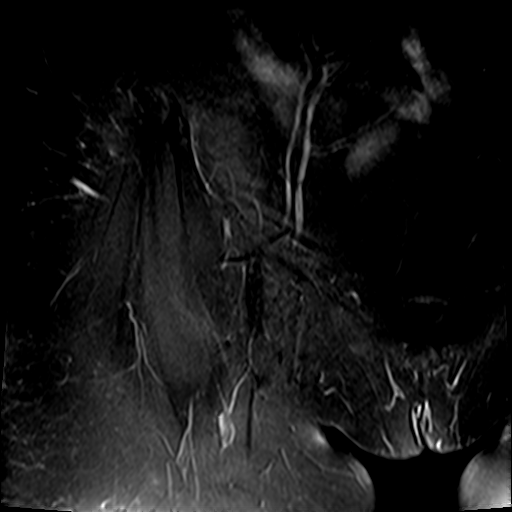

[Series 6: t2_sag_fs · sagittal · 5.0mm · 0.37mm/px · 2 of 18 slices shown]
[im 1/18]
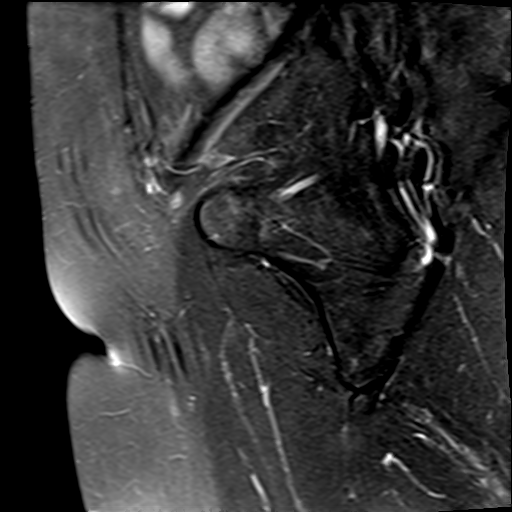
[im 5/18]
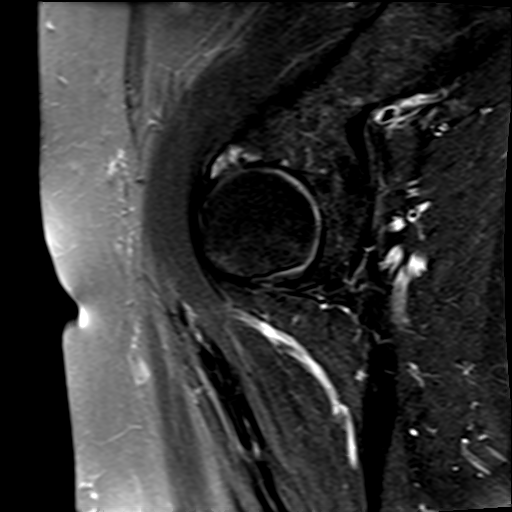

[35 of 40 positions shown; findings below may reference images not displayed]

FINDINGS: Marrow: No acute fracture. No bone contusion. No erosion. No femoral head AVN. Subchondral marrow edema and cystic change about the right hip, particularly at the superior acetabulum.

Visualized SI joints and pubic symphysis: Minimal pubic symphysis DJD. Moderate right SI joint DJD.

Hip joints: No joint effusion. Tear of the anterosuperior labrum. Multiple small high-grade chondral defects at the anterior superior acetabulum, particularly at the chondral-labral junction. Ligamentum teres is intact.

Tendons: Gluteal tendons are intact. Mild right trochanteric bursitis. Iliopsoas and rectus femoris tendons are intact. Chronic low-grade partial-thickness tearing of the hamstring tendon origin.

Soft tissues: No suspicious soft tissue mass. No right inguinal adenopathy or hernia. No acute muscle injury. Mild atrophy of the gluteus minimus muscle.

No free fluid in the pelvis.
IMPRESSION: 1. Tear of the anterosuperior right hip labrum.

2. Moderate right hip chondral abnormalities.

3. Mild right trochanteric bursitis.

## 2020-05-12 IMAGING — CR [HOSPITAL] L SPINE
2 series · 2 of 2 positions shown · non-contrast
Comparison: None.

HISTORY: 69-year-old female, MRI comparison back pain.
TECHNIQUE: Lumbar spine x-rays 2 view, No Charge.

[t lumbar spine ap]
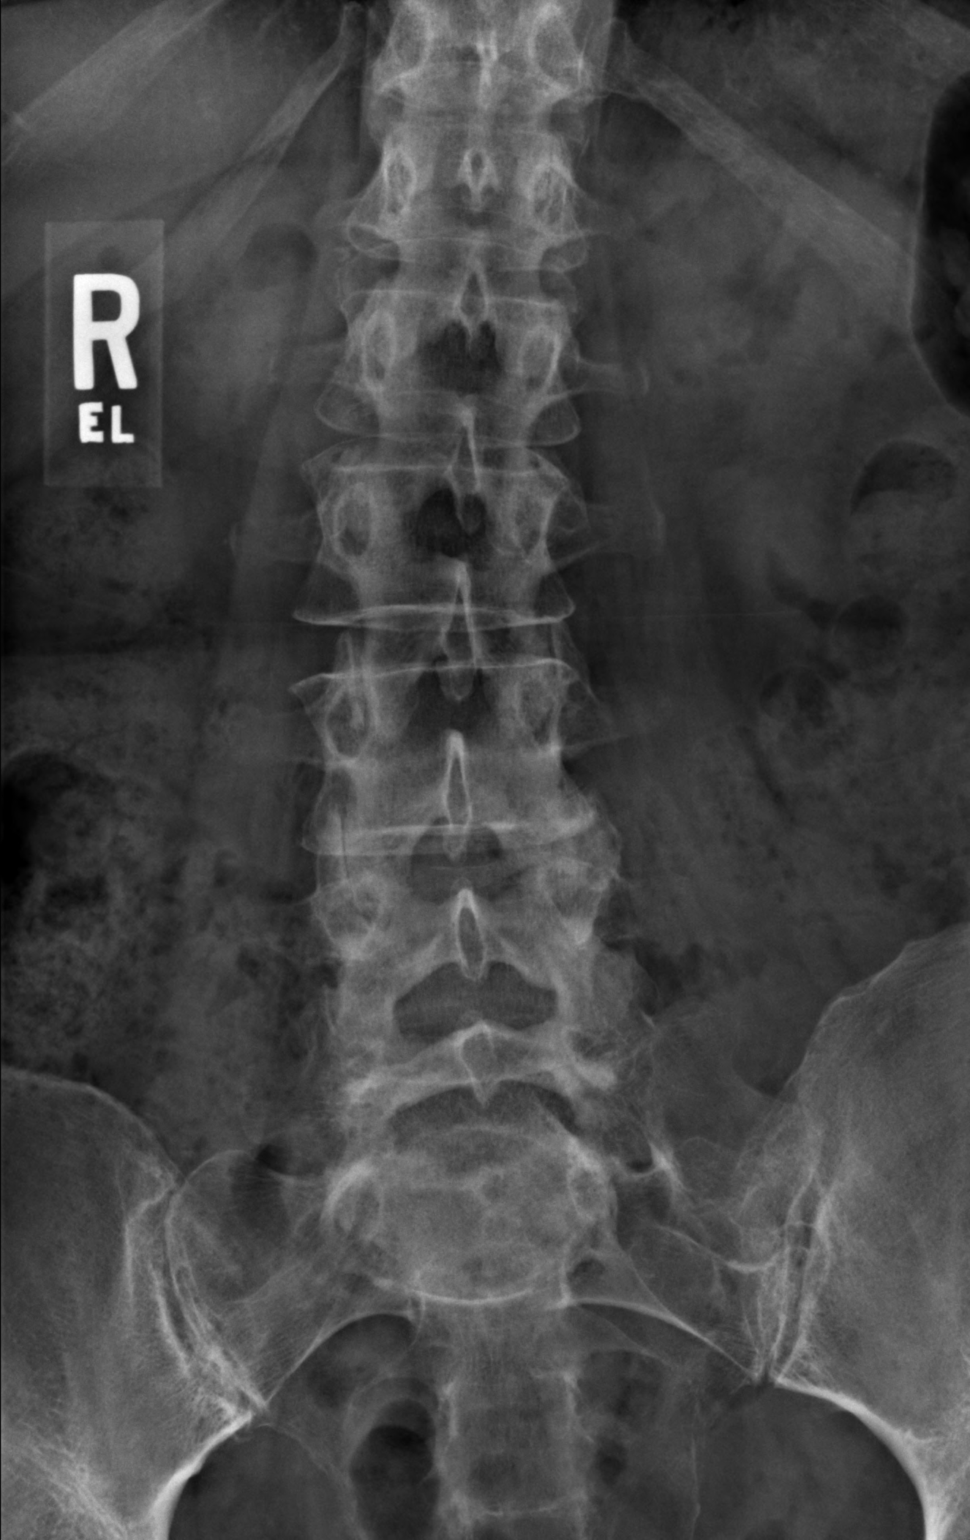

[t lumbar spine lat]
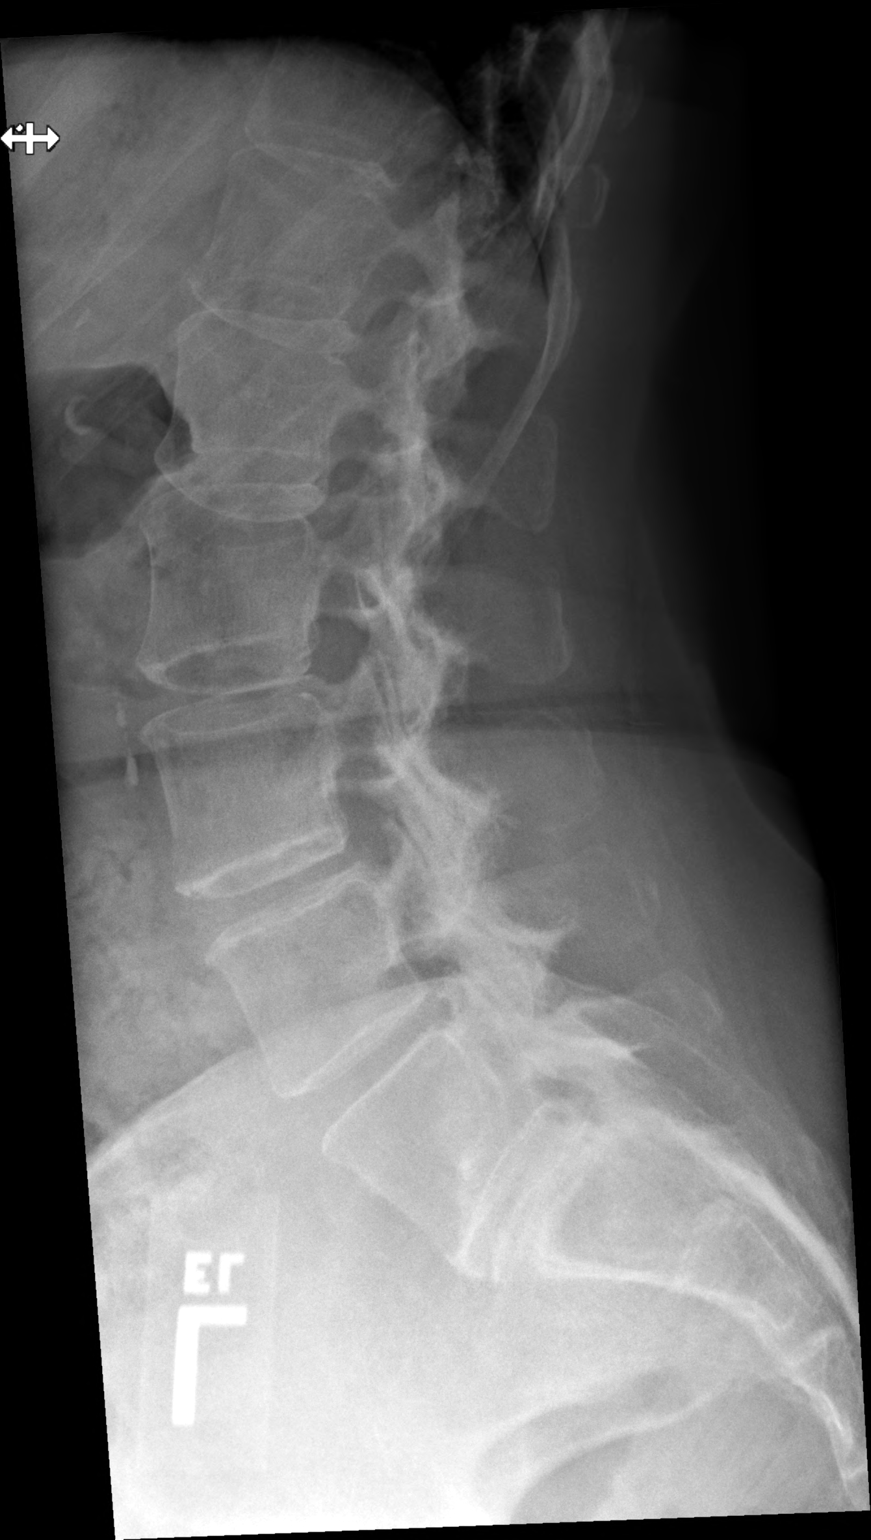

[2 of 2 positions shown; findings below may reference images not displayed]

FINDINGS: There appear to be 6 nonrib-bearing lumbar vertebrae however the last or L6 is transitional, fully sacralized on the left, and lumbarized on the right. Mild dextroscoliosis of about 5 degrees apex L3-4. Pedicles intact. Disc space narrowing at the transitional L6-S1 level and disc space narrowing to mild degree at L5-L6. Facet arthropathy.
IMPRESSION: 6 lumbar vertebrae with the last a transitional vertebrae. Dextroscoliosis. Degenerative disc disease. No visualized fracture.

## 2020-05-12 IMAGING — MR MRI LSPINE WO CONTRAST
5 of 6 series · 28 of 48 positions shown · non-contrast
Comparison: Radiograph dated 05/12/2020

HISTORY: Lumbar radiculopathy
TECHNIQUE: Routine multiplanar MRI of the lumbar spine was performed without IV contrast.

[Series 1: bSSFP · axial · 8.0mm · 1.37mm/px · z∈[-61,+175]mm · 8 of 25 slices shown]
[im 1/25]
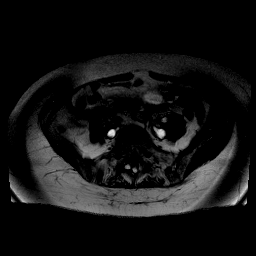
[im 3/25]
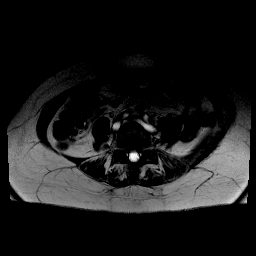
[im 9/25]
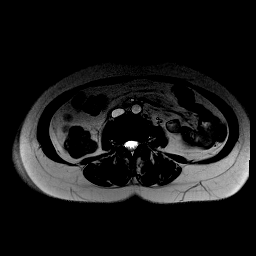
[im 11/25]
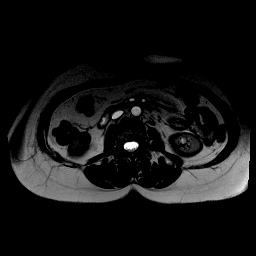
[im 14/25]
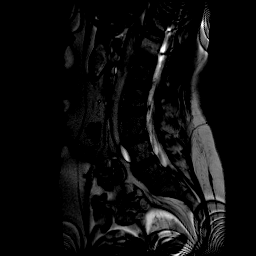
[im 17/25]
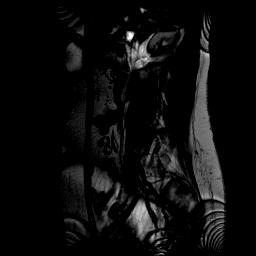
[im 22/25]
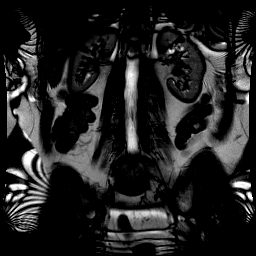
[im 25/25]
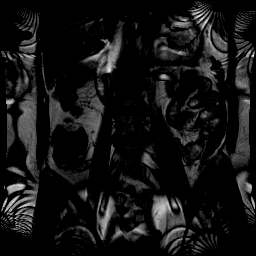

[Series 2: t2_sag · sagittal · 4.0mm · 0.34mm/px · 5 of 14 slices shown]
[im 1/14]
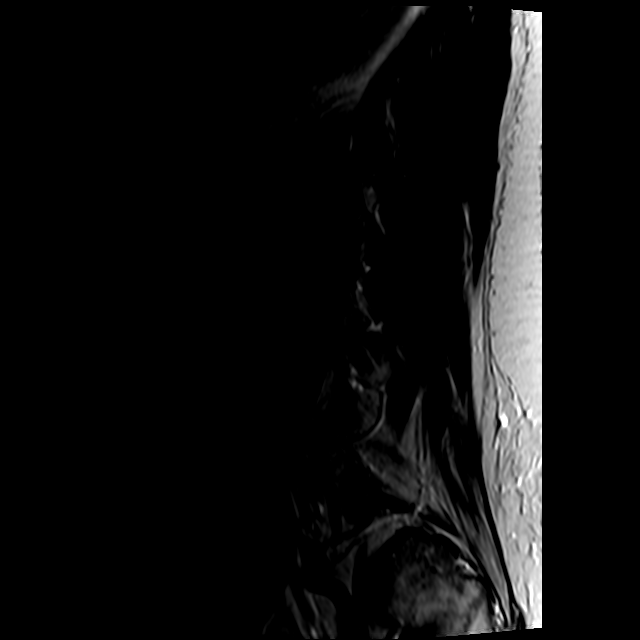
[im 4/14]
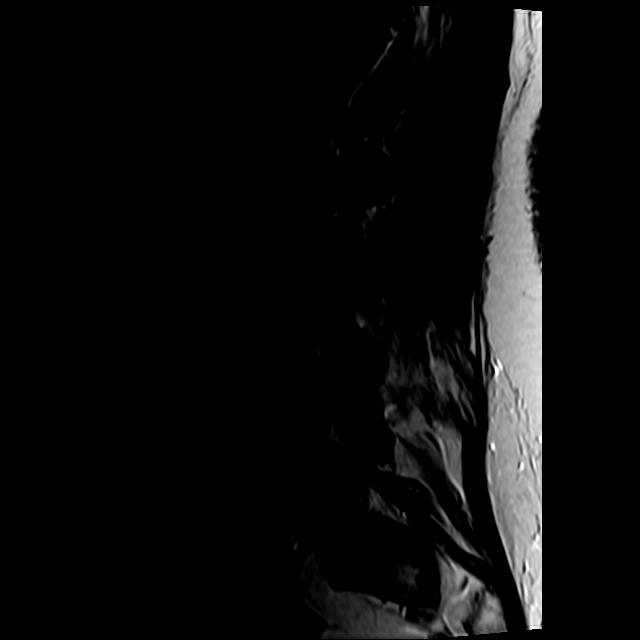
[im 7/14]
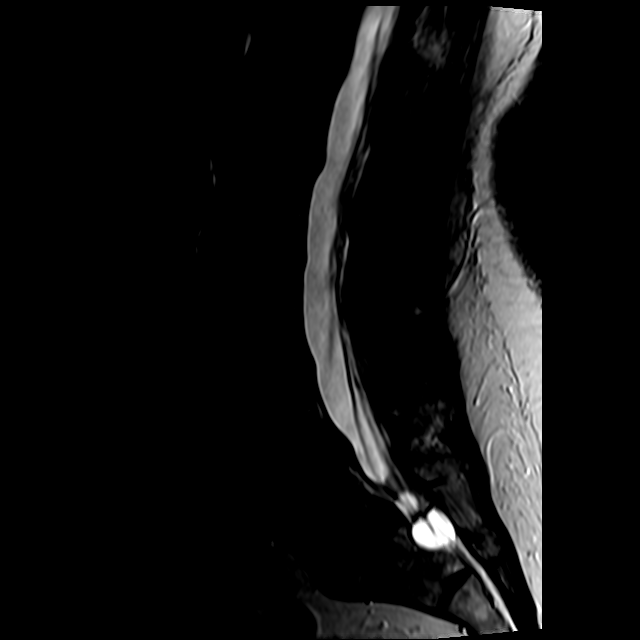
[im 10/14]
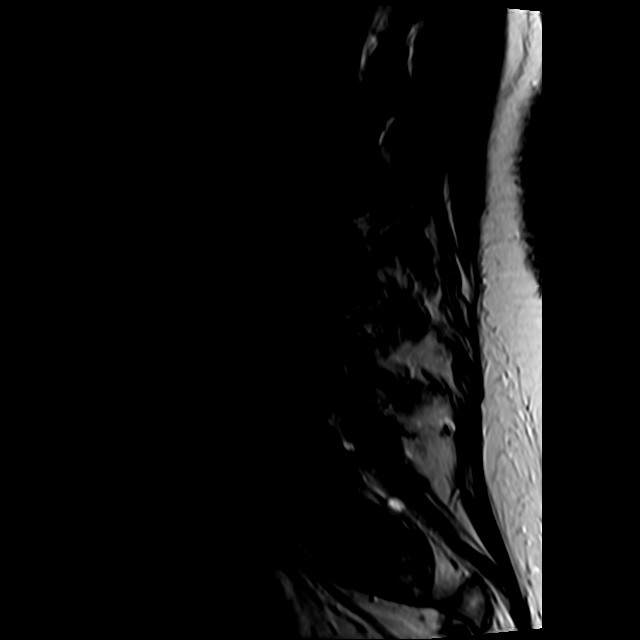
[im 14/14]
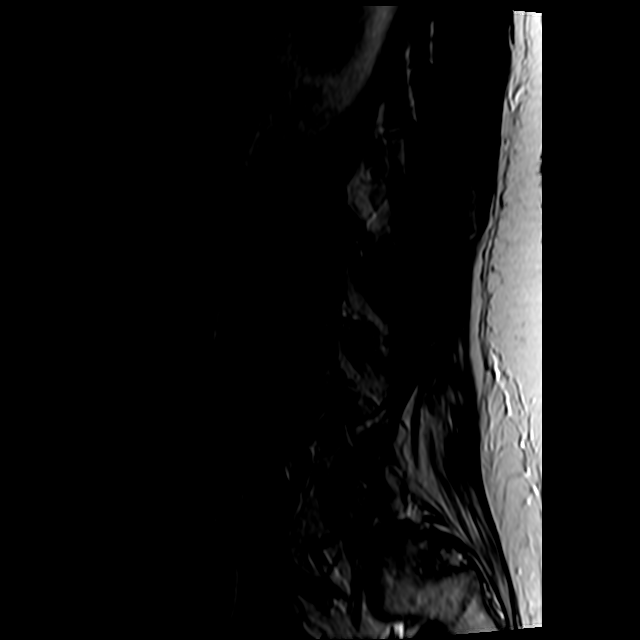

[Series 3: t1_sag · sagittal · 4.0mm · 0.69mm/px · 5 of 14 slices shown]
[im 1/14]
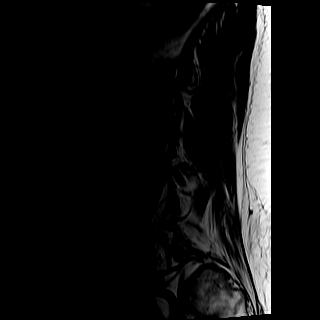
[im 4/14]
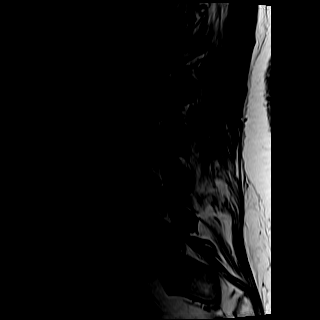
[im 7/14]
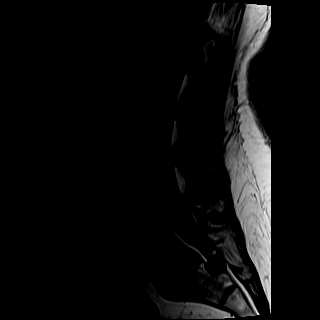
[im 10/14]
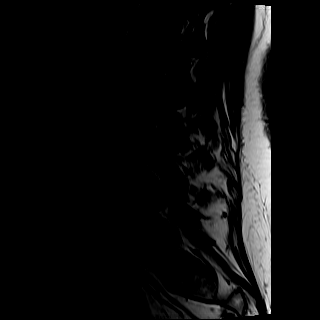
[im 14/14]
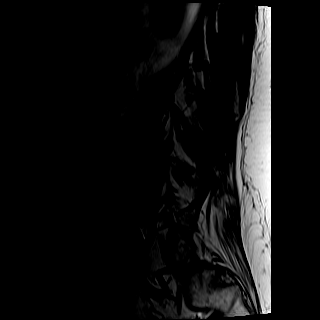

[Series 4: ir_sag · sagittal · 4.0mm · 0.43mm/px · 5 of 14 slices shown]
[im 1/14]
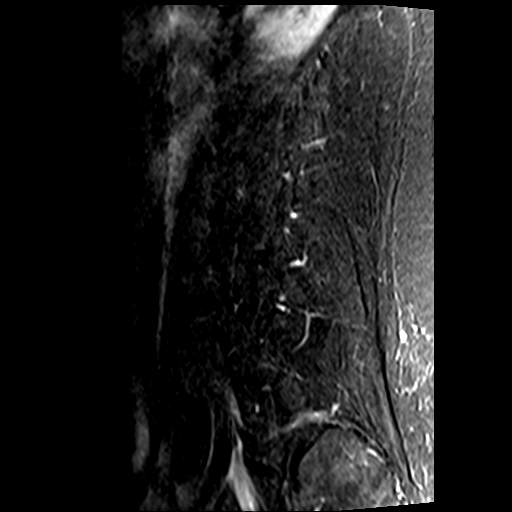
[im 4/14]
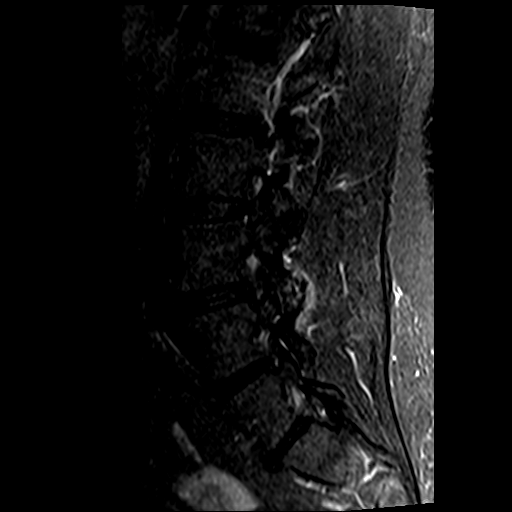
[im 7/14]
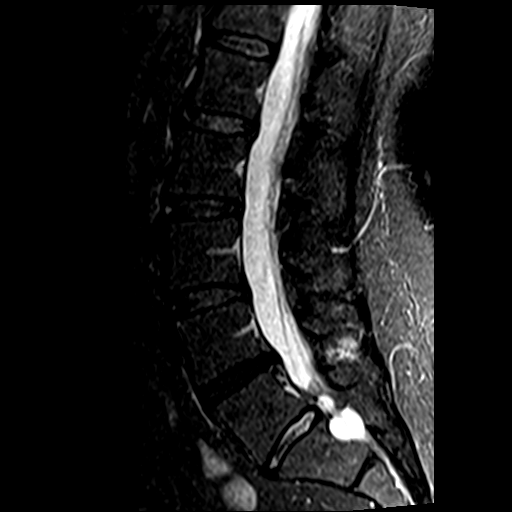
[im 10/14]
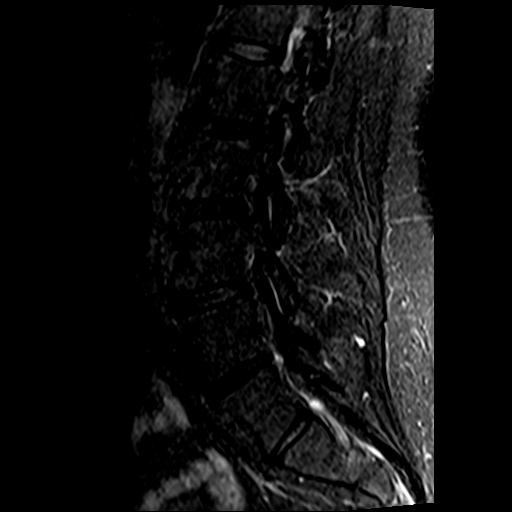
[im 14/14]
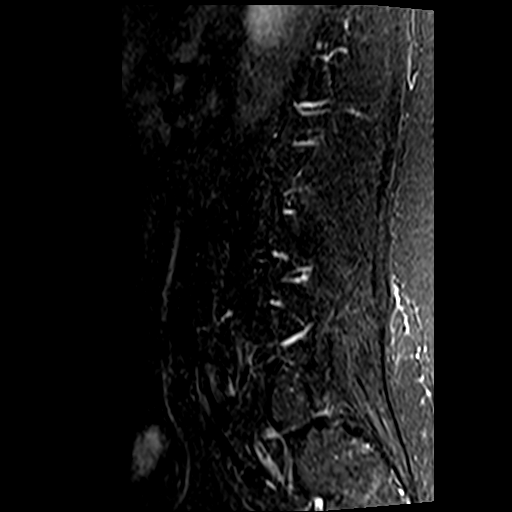

[Series 5: t1_axial_obl · axial · 4.0mm · 0.50mm/px · z∈[-121,-27]mm · 5 of 27 slices shown]
[im 1/27]
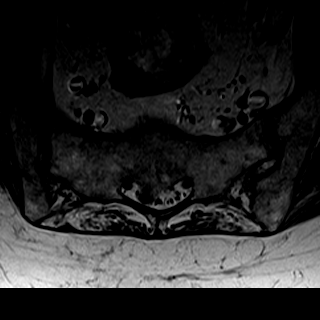
[im 3/27]
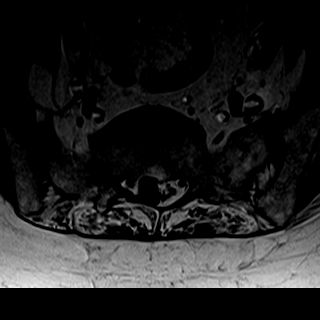
[im 9/27]
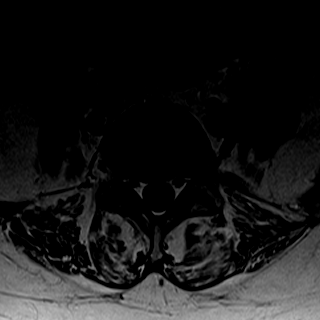
[im 12/27]
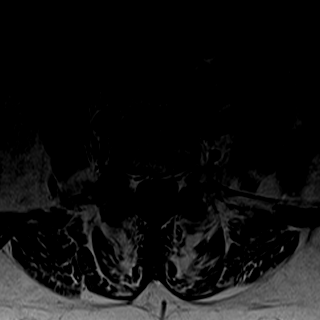
[im 15/27]
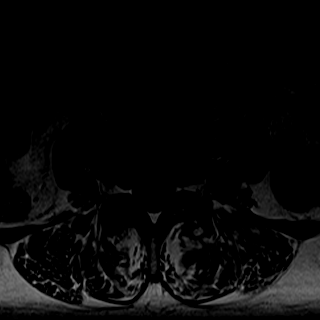

[28 of 48 positions shown; findings below may reference images not displayed]

FINDINGS: Radiographs demonstrate the presence of 6 nonrib-bearing lumbar-type segments. L6 is transitional in nature. Only rudimentary disc space between L6 and S1.

Lumbar segments demonstrate mild dextroscoliosis. No anterolisthesis or retrolisthesis. No fracture or destructive lesion. There is multilevel degenerative disc desiccation, loss of disc space height and diffuse disc bulging. No abnormal signal in an intradural or paraspinous position. Conus medullaris terminates normally at L2 and maintains normal signal. Cauda equina is unremarkable.

L1-2: No disc bulge. No disc herniation. No canal or foraminal stenosis.

L2-3: Minor disc bulge. Mild facet arthropathy. No canal stenosis. Mild foraminal stenoses.

L3-4: Mild disc bulge. Mild facet arthropathy. No canal stenosis. No significant foraminal stenosis.

L4-5: Mild disc bulge. Moderate facet arthropathy. No canal stenosis. No foraminal stenosis.

L5-6: Mild to moderate disc bulge. Mild facet arthropathy. No canal stenosis. No significant foraminal stenosis.

L6-S1: No disc bulge or herniation. Tarlov cyst noted. Mild facet arthropathy. No canal stenosis. No foraminal stenosis.
IMPRESSION: 6 nonrib-bearing lumbar-type segments. Mild dextroscoliosis. No fractures or destructive lesions. Mild degenerative disc disease and facet arthropathy. Mild foraminal stenoses bilaterally at L2-3.

## 2020-05-22 IMAGING — RF ARTHROCENTESIS MAJOR JOINT RT
3 series · 3 of 3 positions shown · non-contrast
Comparison: None.

INDICATION: Right hip pain.

[Series 1: fluoro_iodine_bariatric_singleshot_bw · 0.20mm/px · 1 of 1 slices shown (1 of 3)]
[im 1/1]
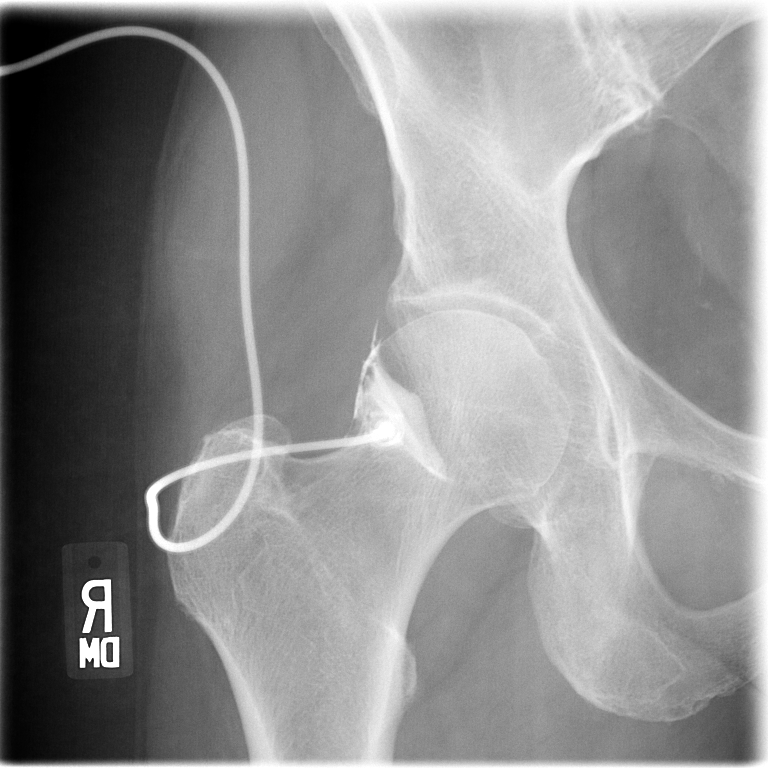

[Series 2: fluoro_iodine_bariatric_singleshot_bw · 0.20mm/px · 1 of 1 slices shown (2 of 3)]
[im 1/1]
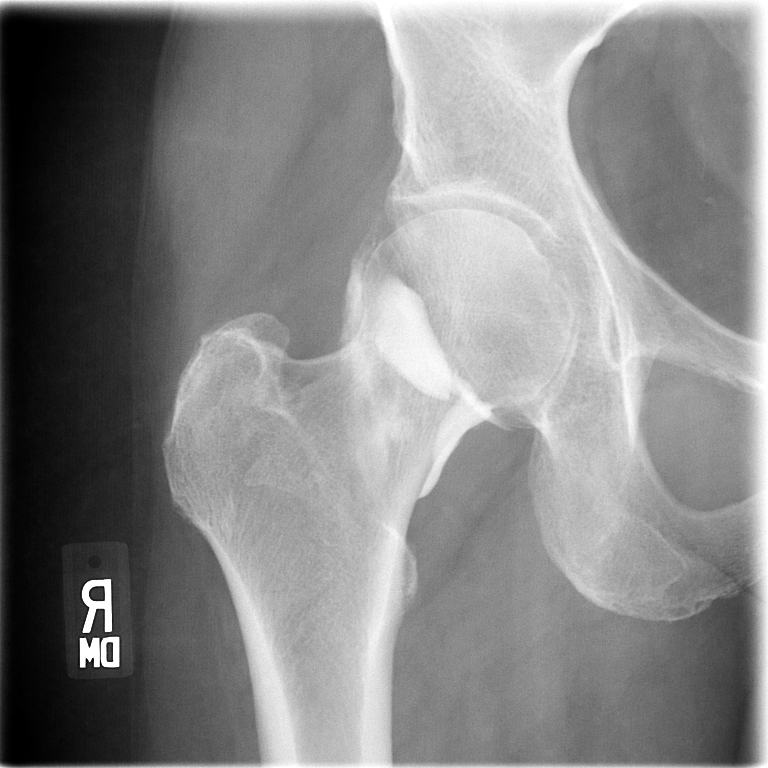

[Series 3: fluoro_iodine_bariatric_singleshot_bw · 0.20mm/px · 1 of 1 slices shown (3 of 3)]
[im 1/1]
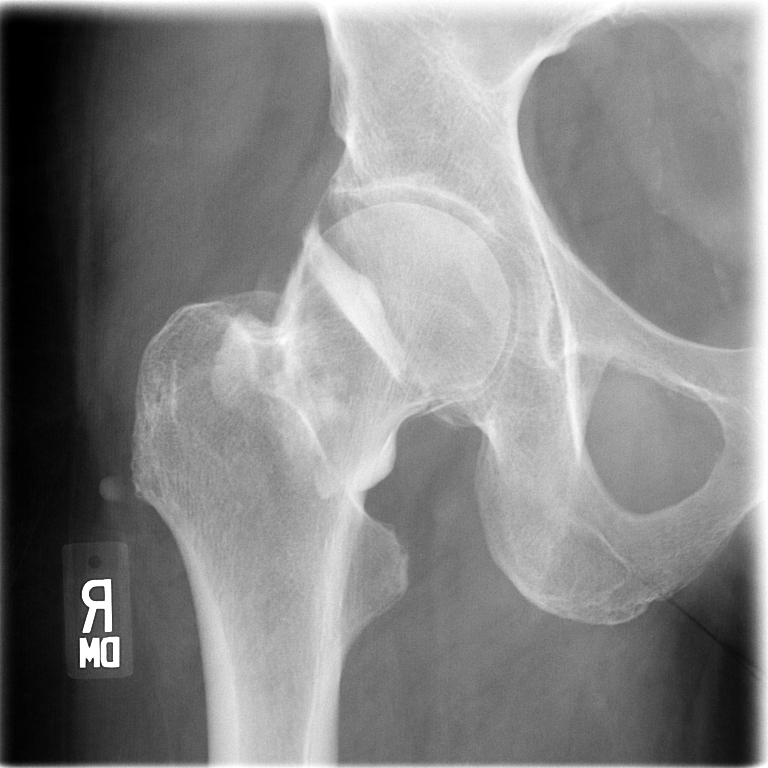

[3 of 3 positions shown; findings below may reference images not displayed]

PROCEDURE: Right intra-articular hip injection under fluoroscopy confirmed with arthrography.

DESCRIPTION OF PROCEDURE: Informed signed consent was obtained. The patient was prepped and draped as usual and after local anesthesia and under fluoroscopic guidance a 22-gauge spinal needle was introduced into the right hip. Contrast material was instilled confirming the intra-articular location of the needle. Then, a volume of approximately 8 mL of a mixture of 4 mL of bupivacaine, 2 mL of lidocaine and 2 mL of Kenalog 40 was injected into the hip under fluoroscopic guidance. Needle was removed and puncture site covered with Band-Aid. Preprocedure pain was graded by the patient has [DATE] with post-procedure pain [DATE].

Total fluoroscopy time 1.0 mm. 3 fluoroscopic images.
IMPRESSION: Successful right hip injection under fluoroscopic guidance confirmed with arthrography.

## 2020-06-30 ENCOUNTER — Encounter: Admit: 2020-06-30 | Discharge: 2020-06-30 | Payer: MEDICARE | Attending: Registered Nurse | Primary: Family Medicine

## 2020-06-30 DIAGNOSIS — R350 Frequency of micturition: Secondary | ICD-10-CM

## 2020-06-30 LAB — POST VOID RESIDUAL (PVR): post void residual: 18 ml

## 2020-06-30 LAB — POCT URINALYSIS DIPSTICK W/O MICROSCOPE (AUTO)
Bilirubin Urine: NEGATIVE
Glucose, Ur: NEGATIVE mg/dl
Ketones, Urine: NEGATIVE
Nitrite, Urine: POSITIVE — AB
Protein, Urine: >=300 mg/dl — AB
Specific Gravity, Urine: >=1.03 (ref 1.002–1.030)
Urobilinogen, Urine: 0.2 eu/dl (ref 0.0–1.0)
pH, Urine: 5 (ref 5.0–9.0)

## 2020-06-30 NOTE — Progress Notes (Signed)
[facility] [place] [facility]  [facility]  [address]  [address]  [place] [address]  Dept: [phone]  Loc: [phone]    Visit Date: 06/30/2020        HPI:     Michelle Bright is a 70 y.o. female who presents today for:  Chief Complaint   Patient presents with   ??? Follow-up     frequency/oab   ??? Urinary Tract Infection     hx of recurrent        HPI     Pt seen in follow up after interstim stage II.    ??  Pt has a hx of recurrent UTIs previously treated with D mannose. ??Pt previously tried oxybutynin, trospium, and Myrbetriq for her OAB without improvement. ??Underwent interstim stage I by Dr. Welton Flakes 02/11/20 and stage II placement 02/25/20.    ??  Reports urinary symptoms improved. ??Now having incontinence only occasional.  Markedly improved per pt and husband. ??Nocturia down to 1-3 x per night. Now only wearing a pad just in case.  Very happy with symptoms.     Current Outpatient Medications   Medication Sig Dispense Refill   ??? Cholecalciferol (VITAMIN D3) 50 MCG (2000 UT) CAPS Take by mouth     ??? D-Mannose 500 MG CAPS Take by mouth 2 times daily      ??? dilTIAZem (CARDIZEM) 30 MG tablet Take 30 mg by mouth 2 times daily     ??? Ascorbic Acid (VITAMIN C) 500 MG CHEW Take 500 mg by mouth daily     ??? metoprolol succinate (TOPROL XL) 50 MG extended release tablet Take 50 mg by mouth daily     ??? furosemide (LASIX) 20 MG tablet 20 mg Patient taking 1/2 tablet to 1 tablet daily     ??? potassium chloride (KLOR-CON) 10 MEQ extended release tablet      ??? lisinopril (PRINIVIL;ZESTRIL) 20 MG tablet Take 20 mg by mouth daily     ??? atorvastatin (LIPITOR) 40 MG tablet Take 40 mg by mouth daily     ??? nitroGLYCERIN (NITROSTAT) 0.4 MG SL tablet up to max of 3 total doses. If no relief after 1 dose, call 911. 25 tablet 3   ??? acetaminophen (TYLENOL) 500 MG tablet Take 500 mg by mouth every 6 hours as needed for Pain     ??? warfarin (COUMADIN) 3 MG tablet Take 3 mg by mouth daily      ??? levothyroxine  (SYNTHROID) 100 MCG tablet Take 100 mcg by mouth Daily      ??? aspirin 81 MG tablet Take 81 mg by mouth daily     ??? IRON PO Take 65 mg by mouth 2 times daily     ??? flecainide (TAMBOCOR) 100 MG tablet Take 50 mg by mouth 2 times daily       No current facility-administered medications for this visit.       Past Medical History  Michelle Bright  has a past medical history of Arthritis, Atrial fibrillation (HCC), CAD (coronary artery disease), Cerebral artery occlusion with cerebral infarction [facility]), Hypertension, Osteoporosis, and Thyroid disease.    Past Surgical History  The patient  has a past surgical history that includes Femur Surgery (12/2014); Thyroid surgery (2005); fracture surgery; Stimulator Surgery (N/A, 02/11/2020); and Stimulator Surgery (N/A, 02/25/2020).    Family History  This patient's family history is not on file.    Social History  Michelle Bright  reports that  she quit smoking about 28 years ago. She has never used smokeless tobacco. She reports previous alcohol use. She reports that she does not use drugs.      Subjective:      Review of Systems   Constitutional: Negative for activity change, appetite change, chills, diaphoresis, fatigue, fever and unexpected weight change.   Gastrointestinal: Negative for abdominal pain, nausea and vomiting.   Genitourinary: Negative for decreased urine volume, difficulty urinating, dysuria, flank pain, frequency, hematuria and urgency.   Musculoskeletal: Negative for back pain.       Objective:   BP (!) 158/82    Ht 4\' 11"  (1.499 m)    Wt 119 lb (54 kg)    BMI 24.04 kg/m??     Physical Exam  Vitals reviewed.   Constitutional:       General: She is not in acute distress.     Appearance: Normal appearance. She is well-developed. She is not ill-appearing or diaphoretic.   HENT:      Head: Normocephalic and atraumatic.      Right Ear: External ear normal.      Left Ear: External ear normal.      Nose: Nose normal.      Mouth/Throat:      Mouth: Mucous membranes are moist.   Eyes:       General: No scleral icterus.        Right eye: No discharge.         Left eye: No discharge.   Neck:      Vascular: No JVD.      Trachea: No tracheal deviation.   Pulmonary:      Effort: Pulmonary effort is normal. No respiratory distress.   Abdominal:      General: There is no distension.      Tenderness: There is no abdominal tenderness. There is no right CVA tenderness or left CVA tenderness.   Musculoskeletal:         General: No tenderness. Normal range of motion.   Neurological:      Mental Status: She is alert and oriented to person, place, and time. Mental status is at baseline.   Psychiatric:         Mood and Affect: Mood normal.         Behavior: Behavior normal.         Thought Content: Thought content normal.         POC  Results for POC orders placed in visit on 06/30/20   POCT Urinalysis No Micro (Auto)   Result Value Ref Range    Glucose, Ur Negative NEGATIVE mg/dl    Bilirubin Urine Negative     Ketones, Urine Negative NEGATIVE    Specific Gravity, Urine >= 1.030 1.002 - 1.030    Blood, UA POC Large (A) NEGATIVE    pH, Urine 5.00 5.0 - 9.0    Protein, Urine >= 300 (A) NEGATIVE mg/dl    Urobilinogen, Urine 0.20 0.0 - 1.0 eu/dl    Nitrite, Urine Positive (A) NEGATIVE    Leukocyte Clumps, Urine Moderate (A) NEGATIVE    Color, Urine Yellow [id number]    Character, Urine Clear [id number]   poct post void residual   Result Value Ref Range    post void residual 18 ml     Patients recent PSA values are as follows  No results found for: PSA, PSADIA     Recent BUN/Creatinine:  Lab Results   Component Value Date  BUN 19 01/28/2020    CREATININE 0.7 01/28/2020       Assessment:   Recurrent UTIs  OAB  Nocturia    Plan:     Urine dip Nites positive but asymptomatic.  Send for culture and call pt with results.  Only treat if symptomatic.      Pt doing well with urinary symptoms at this time.  Program 3 setting 0.7.      F/u in 6 months

## 2020-07-02 LAB — CULTURE, URINE

## 2020-07-02 NOTE — Telephone Encounter (Signed)
Pt's urine culture significant for bacteria.  Will send in script for antibiotic if having symptoms of UTI.  How is pt doing?

## 2020-07-03 MED ORDER — NITROFURANTOIN MONOHYD MACRO 100 MG PO CAPS
100 | ORAL_CAPSULE | Freq: Two times a day (BID) | ORAL | 0 refills | Status: AC
Start: 2020-07-03 — End: 2020-07-10

## 2020-07-03 NOTE — Telephone Encounter (Signed)
Informed pt

## 2020-07-03 NOTE — Telephone Encounter (Signed)
She is stating that it is burning frequency and no fever or chills    She would like to to have an antibiotic.    She would like it sent to [facility] in [place].

## 2020-07-03 NOTE — Telephone Encounter (Signed)
Script for Macrobid sent to pharmacy.

## 2020-08-15 IMAGING — MG MAMMO SCRN BIL W/CAD TOMO
8 series · 8 of 24 positions shown · non-contrast
Comparison: The present examination has been compared to prior imaging studies.

Images Obtained from [facility]
INDICATION: Screening.
TECHNIQUE: Bilateral 2-D digital screening mammogram was performed followed by 3-D tomosynthesis.  Current study was also evaluated with a computer aided detection (CAD) system.
MAMMOGRAM FINDINGS:
The breasts are almost entirely fatty.
No suspicious abnormality is seen in either breast.  There are no significant changes from the prior study.

[R MLO]
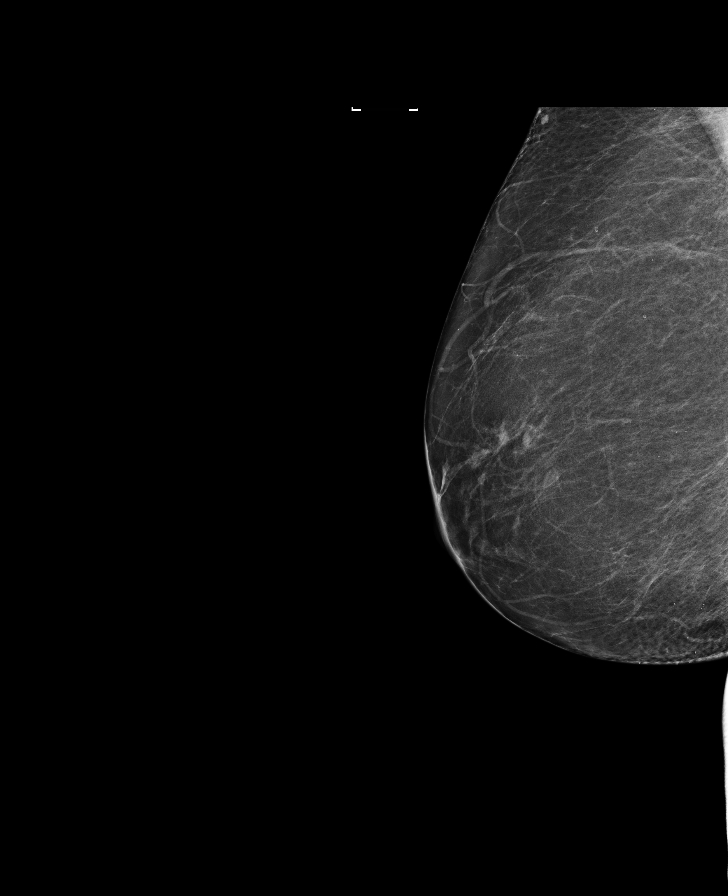

[L CC]
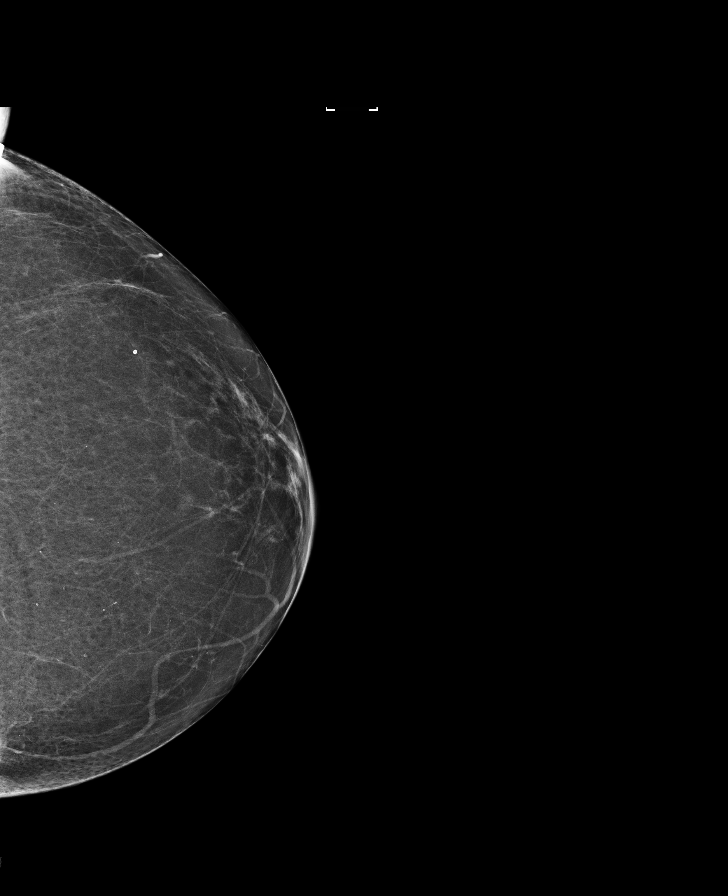

[L MLO]
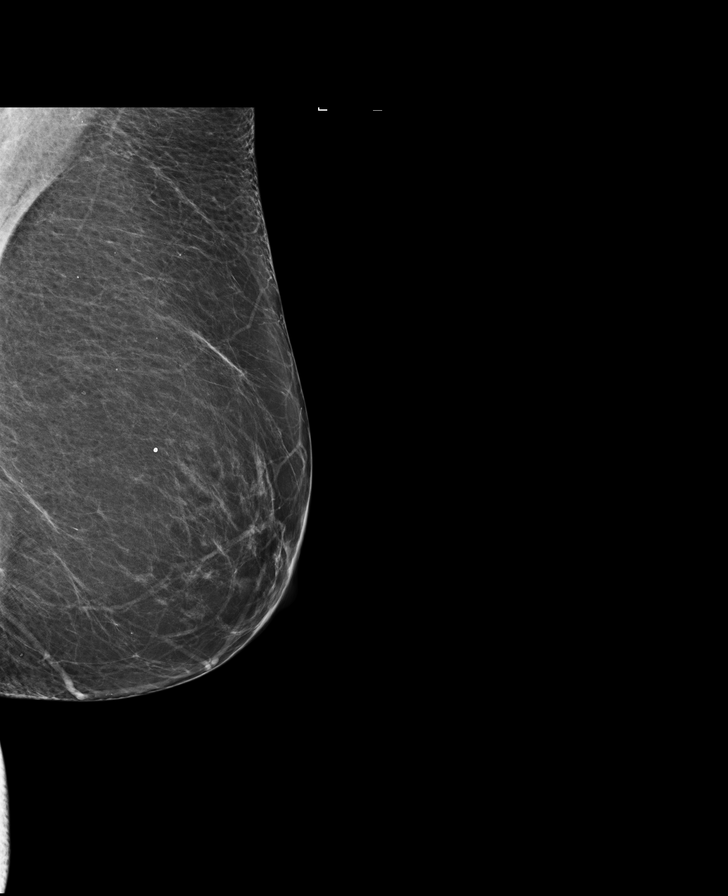

[R CC]
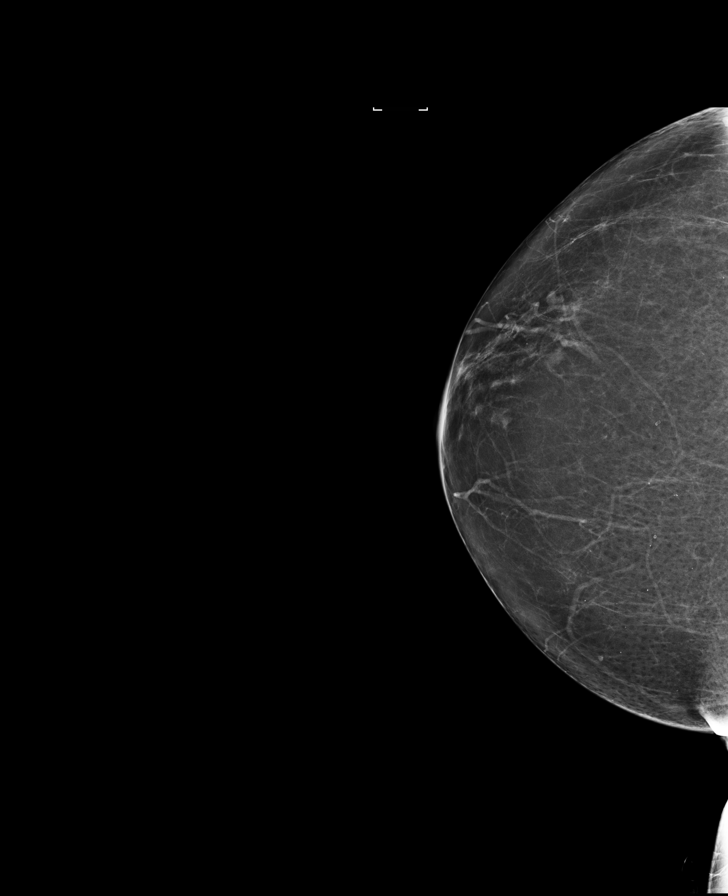

[R MLO tomo · tomo slice 42/83.0]
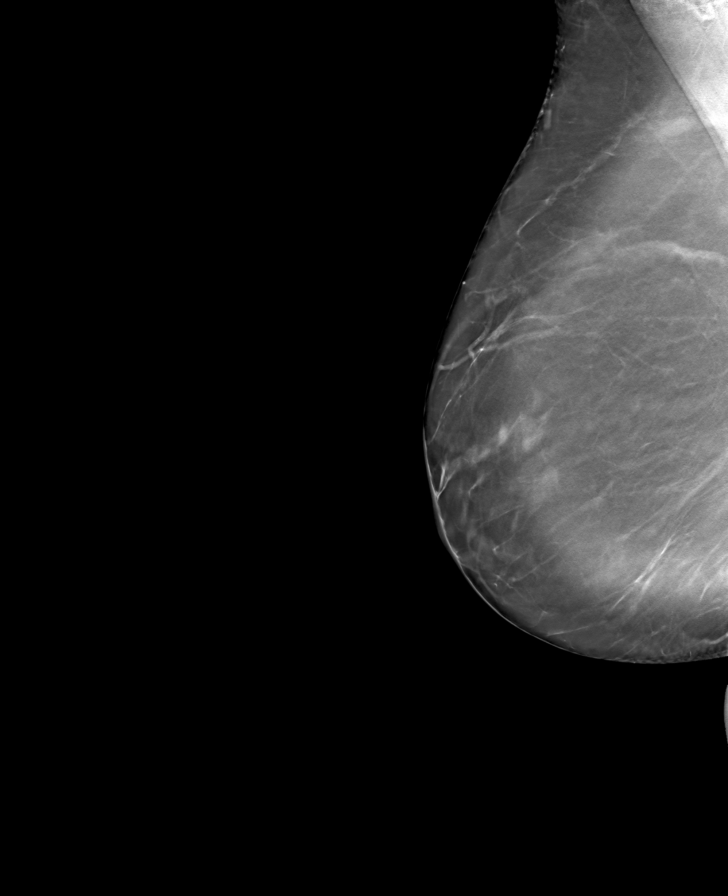

[L CC tomo · tomo slice 39/77.0]
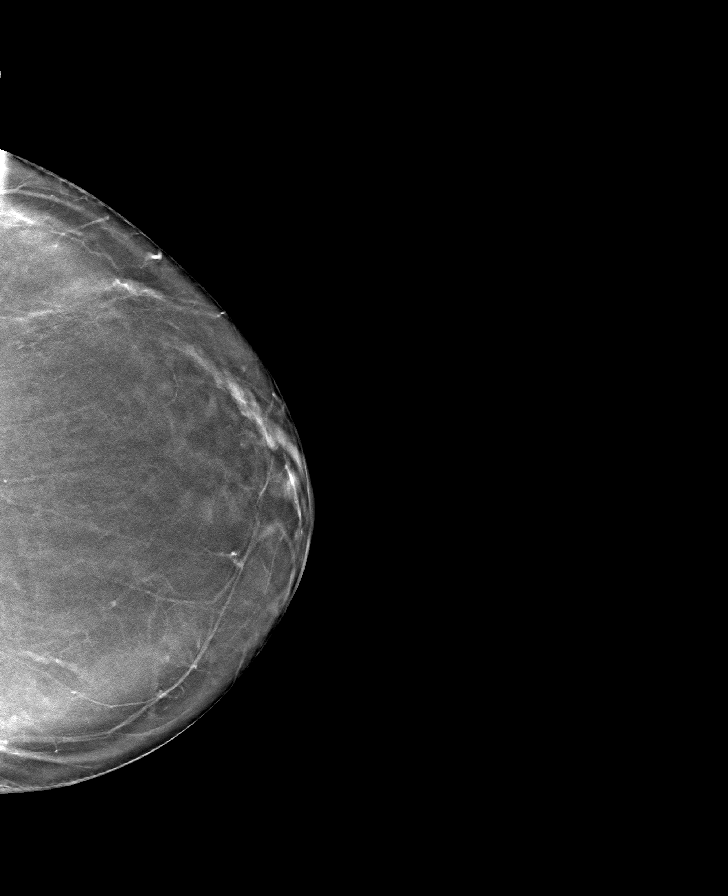

[R CC tomo · tomo slice 39/76.0]
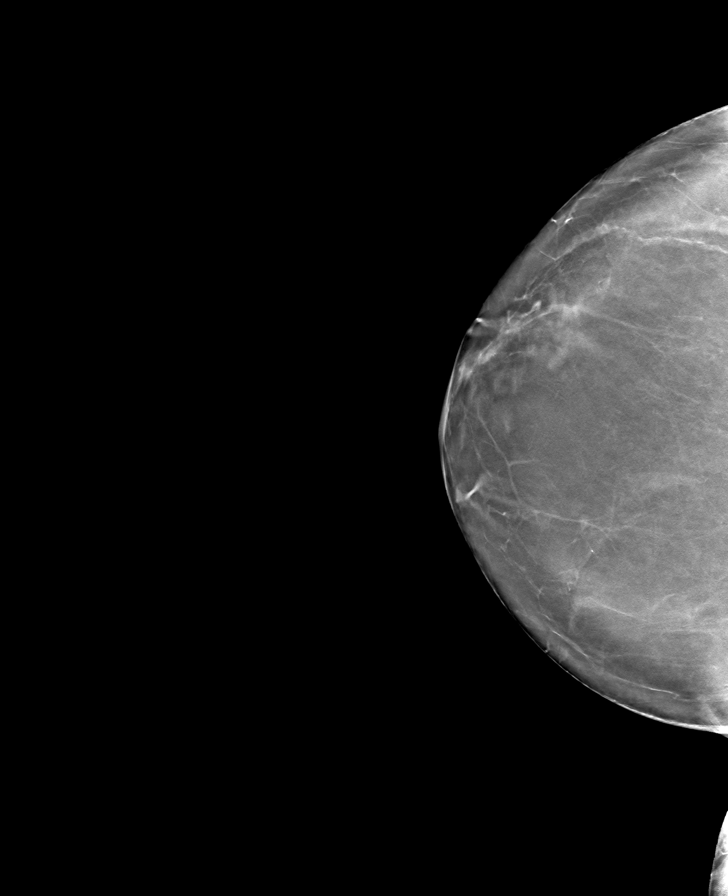

[L MLO tomo · tomo slice 43/85.0]
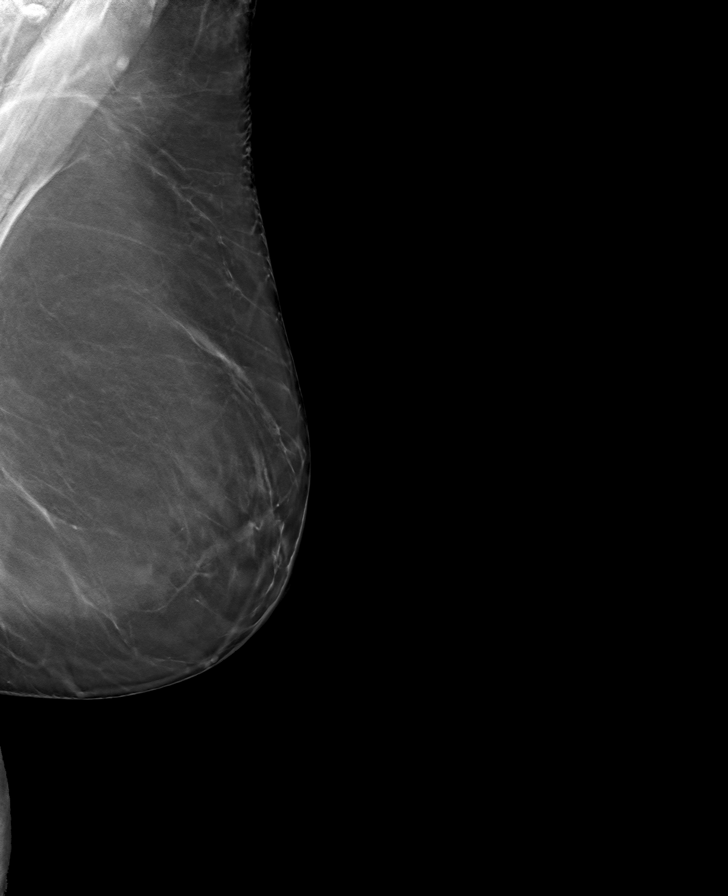

[8 of 24 positions shown; findings below may reference images not displayed]

IMPRESSION: There is no mammographic evidence of malignancy.
Screening mammogram recommended in 1 year.
BI-RADS Category 1: Negative

## 2020-10-07 NOTE — Telephone Encounter (Signed)
Patient husband called to check if the interstim is compatible with MRI.    Advised to call [employer] rep. Patient husband provided with Ronaldo Miyamoto phone number.

## 2020-10-07 NOTE — Telephone Encounter (Signed)
I have personally verified, reviewed, and approved these actions.

## 2020-11-04 ENCOUNTER — Ambulatory Visit: Admit: 2020-11-04 | Discharge: 2020-11-04 | Payer: MEDICARE | Attending: Registered Nurse | Primary: Family Medicine

## 2020-11-04 ENCOUNTER — Telehealth

## 2020-11-04 DIAGNOSIS — R300 Dysuria: Secondary | ICD-10-CM

## 2020-11-04 LAB — POCT URINALYSIS DIPSTICK W/O MICROSCOPE (AUTO): Nitrite, UA: NEGATIVE

## 2020-11-04 LAB — POST VOID RESIDUAL (PVR): post void residual: 333 ml

## 2020-11-04 MED ORDER — CEFDINIR 300 MG PO CAPS
300 MG | ORAL_CAPSULE | Freq: Two times a day (BID) | ORAL | 0 refills | Status: AC
Start: 2020-11-04 — End: 2020-11-14

## 2020-11-04 MED ORDER — PHENAZOPYRIDINE HCL 100 MG PO TABS
100 MG | ORAL_TABLET | Freq: Three times a day (TID) | ORAL | 0 refills | Status: DC | PRN
Start: 2020-11-04 — End: 2020-12-09

## 2020-11-04 NOTE — Telephone Encounter (Signed)
Patient was last seen 06/30/2020 and her next appt is 01/13/2021 with [facility].  Husband Michelle Bright (on hippa) calling in for patient asking for an antibiotic for possible uti.  Just starting this morning she is having burning with urination and her urine is dark yellow. No other symptoms, no fever.  Pharmacy is [facility], and lab if needs to do urine speciman would be [facility].  Please advise.

## 2020-11-04 NOTE — Telephone Encounter (Signed)
Patient started to have burning this am. She now c/o not being able to urinate and has not voided since 0900. Office visit scheduled after speaking to [facility] CNP.

## 2020-11-04 NOTE — Progress Notes (Signed)
Patient has given me verbal consent to perform catheter placement  Yes    Does patient have latex allergy?  No  Does patient have shellfish or betadine allergy?  No      Following Margarette Asal, APRN plan of care. Inserted 14 Fr  Catheter without difficulty.    Patient's urethra was cleansed with betadine swab. 14 Fr regular foley was inserted using sterile water-soluble lubricant without difficulty and inflated balloon with 10 ml of water. Foley Catheter was hooked up to leg bag with straps.       Patient instructed on draining catheter bags.    Patient instructed to keep leg bag above the knee to prevent pulling on catheter causing blood.    Patient instructed on how to switch from leg bag to overnight bag.     Drained leg bag after teaching, 400 ml of urine was drained from bag.

## 2020-11-04 NOTE — Progress Notes (Signed)
[facility] [place] [facility]  [facility]  [address]  [address]  [place] [address]  Dept: [phone]  Loc: [phone]    Visit Date: 11/04/2020        HPI:     Michelle Bright is a 70 y.o. female who presents today for:  Chief Complaint   Patient presents with    Urinary Tract Infection       HPI  Pt seen for complaint of possible UTI.     Pt has a hx of recurrent UTIs previously treated with D mannose.  Pt previously tried oxybutynin, trospium, and Myrbetriq for her OAB without improvement.  Underwent interstim stage I by Dr. Welton Flakes 02/11/20 and stage II placement 02/25/20.       Pt having significant abdominal pain, discomfort, constant frequency with voiding only very small amounts.  Urinated twice in office only 5-10 mls each time.  Having significant burning with urination. Afebrile.      Current Outpatient Medications   Medication Sig Dispense Refill    metOLazone (ZAROXOLYN) 2.5 MG tablet       cefdinir (OMNICEF) 300 MG capsule Take 1 capsule by mouth 2 times daily for 10 days 20 capsule 0    phenazopyridine (PYRIDIUM) 100 MG tablet Take 1 tablet by mouth 3 times daily as needed for Pain (for burning with urination) 9 tablet 0    Cholecalciferol (VITAMIN D3) 50 MCG (2000 UT) CAPS Take by mouth      D-Mannose 500 MG CAPS Take by mouth 2 times daily       Ascorbic Acid (VITAMIN C) 500 MG CHEW Take 500 mg by mouth daily      metoprolol succinate (TOPROL XL) 50 MG extended release tablet Take 50 mg by mouth daily      furosemide (LASIX) 20 MG tablet 20 mg Patient taking 1/2 tablet to 1 tablet daily      potassium chloride (KLOR-CON) 10 MEQ extended release tablet       nitroGLYCERIN (NITROSTAT) 0.4 MG SL tablet up to max of 3 total doses. If no relief after 1 dose, call 911. 25 tablet 3    acetaminophen (TYLENOL) 500 MG tablet Take 500 mg by mouth every 6 hours as needed for Pain      warfarin (COUMADIN) 3 MG tablet Take 3 mg by mouth daily       levothyroxine (SYNTHROID) 100  MCG tablet Take 100 mcg by mouth Daily       aspirin 81 MG tablet Take 81 mg by mouth daily      IRON PO Take 65 mg by mouth 2 times daily       No current facility-administered medications for this visit.       Past Medical History  Michelle Bright  has a past medical history of Arthritis, Atrial fibrillation (HCC), CAD (coronary artery disease), Cerebral artery occlusion with cerebral infarction [facility]), Hypertension, Osteoporosis, and Thyroid disease.    Past Surgical History  The patient  has a past surgical history that includes Femur Surgery (12/2014); Thyroid surgery (2005); fracture surgery; Stimulator Surgery (N/A, 02/11/2020); and Stimulator Surgery (N/A, 02/25/2020).    Family History  This patient's family history is not on file.    Social History  Michelle Bright  reports that she quit smoking about 28 years ago. Her smoking use included cigarettes. She has never used smokeless tobacco. She reports that she does not currently use alcohol. She reports that she does  not use drugs.      Subjective:      Review of Systems   Constitutional:  Negative for activity change, appetite change, chills, diaphoresis, fatigue, fever and unexpected weight change.   Gastrointestinal:  Negative for abdominal pain, nausea and vomiting.   Genitourinary:  Positive for dysuria, frequency and urgency. Negative for decreased urine volume, difficulty urinating, flank pain and hematuria.   Musculoskeletal:  Negative for back pain.     Objective:   Resp 18    Ht 4\' 11"  (1.499 m)    Wt 101 lb (45.8 kg)    BMI 20.40 kg/m??     Physical Exam  Vitals reviewed.   Constitutional:       General: She is not in acute distress.     Appearance: Normal appearance. She is well-developed. She is not ill-appearing or diaphoretic.   HENT:      Head: Normocephalic and atraumatic.      Right Ear: External ear normal.      Left Ear: External ear normal.      Nose: Nose normal.      Mouth/Throat:      Mouth: Mucous membranes are moist.   Eyes:      General: No scleral  icterus.        Right eye: No discharge.         Left eye: No discharge.   Neck:      Vascular: No JVD.      Trachea: No tracheal deviation.   Cardiovascular:      Rate and Rhythm: Normal rate and regular rhythm.   Pulmonary:      Effort: Pulmonary effort is normal. No respiratory distress.      Breath sounds: Normal breath sounds.   Abdominal:      General: There is distension.      Tenderness: There is abdominal tenderness (suprapubic). There is no right CVA tenderness or left CVA tenderness.   Musculoskeletal:         General: No tenderness. Normal range of motion.   Skin:     General: Skin is warm and dry.   Neurological:      Mental Status: She is alert and oriented to person, place, and time. Mental status is at baseline.   Psychiatric:         Mood and Affect: Mood normal.         Behavior: Behavior normal.         Thought Content: Thought content normal.       POC  Results for POC orders placed in visit on 11/04/20   POCT Urinalysis No Micro (Auto)   Result Value Ref Range    Color, UA      Clarity, UA      Glucose, UA POC      Bilirubin, UA      Ketones, UA      Spec Grav, UA      Blood, UA POC large     pH, UA      Protein, UA POC      Urobilinogen, UA      Leukocytes, UA trace     Nitrite, UA neg    poct post void residual   Result Value Ref Range    post void residual 333 ml         Patients recent PSA values are as follows  No results found for: PSA, PSADIA     Recent BUN/Creatinine:  Lab Results  Component Value Date/Time    BUN 19 01/28/2020 08:43 AM    CREATININE 0.7 01/28/2020 08:43 AM       Assessment:   Acute cystitis  Urinary retention  OAB  Hx recurrent UTIs  Nocturia    Plan:     PVR 333 mls.  Foley inserted.  Urine with blood and leuks.  Pt having significant burning.  Start Omnicef and pyridium.  Send urine for culture.      Plan voiding trial in one week.  Husband instructed on how to remove foley catheter at home.  Plan for husband [name] to remove foley catheter Tuesday night and come to  office next Wednesday in [place] for PVR check.      Lab/ma next Weds in [facility] for PVR check after catheter removal Tues night.

## 2020-11-06 LAB — CULTURE, URINE

## 2020-11-06 NOTE — Telephone Encounter (Signed)
Rocky on [employer] advised of the urine results and to finish Omnicef until completed. He voiced understanding.

## 2020-11-06 NOTE — Telephone Encounter (Signed)
-----   Message from [facility], APRN - CNP sent at 11/06/2020  7:25 AM EDT -----  Please let pt and husband know urine culture was significant for infection sensitive to the [facility] that was prescribed.  Continue antibiotic to completion.

## 2020-11-11 ENCOUNTER — Encounter: Admit: 2020-11-11 | Discharge: 2020-11-11 | Payer: MEDICARE | Attending: Registered Nurse | Primary: Family Medicine

## 2020-11-11 DIAGNOSIS — R350 Frequency of micturition: Secondary | ICD-10-CM

## 2020-11-11 LAB — POST VOID RESIDUAL (PVR): post void residual: 104 ml

## 2020-11-11 NOTE — Progress Notes (Signed)
Patient here for PVR check. Patient's husband removed foley catheter on 11/10/20 and she stated that she has been urinating fine especially when she takes her lasix. She has not taken her lasix today because she has some appointments to go to. Her PVR is 104 mL. Spoke with Margarette Asal CNP she said if patient feels OK with out the catheter then we can let it out and have her return for an OV with PVR in [place] in 1 month.

## 2020-11-11 NOTE — Progress Notes (Signed)
I have personally verified, reviewed, released, signed, authenticated, authorized, confirmed,finalized, and approved the actions of the CMA.    OV in 1 month with PVR.  Continue Omnicef to completion.

## 2020-12-09 ENCOUNTER — Ambulatory Visit: Admit: 2020-12-09 | Discharge: 2020-12-09 | Payer: MEDICARE | Attending: Registered Nurse | Primary: Family Medicine

## 2020-12-09 DIAGNOSIS — R350 Frequency of micturition: Secondary | ICD-10-CM

## 2020-12-09 LAB — POCT URINALYSIS DIPSTICK W/O MICROSCOPE (AUTO)
Bilirubin, UA: NEGATIVE
Glucose, UA POC: NEGATIVE
Ketones, UA: NEGATIVE
Nitrite, UA: NEGATIVE
Protein, UA POC: 100
Spec Grav, UA: 1.025
Urobilinogen, UA: 0.2
pH, UA: 6

## 2020-12-09 LAB — POST VOID RESIDUAL (PVR): post void residual: 67 ml

## 2020-12-09 MED ORDER — PHENAZOPYRIDINE HCL 100 MG PO TABS
100 MG | ORAL_TABLET | Freq: Three times a day (TID) | ORAL | 0 refills | Status: DC | PRN
Start: 2020-12-09 — End: 2021-09-08

## 2020-12-09 NOTE — Progress Notes (Signed)
[facility] [place] [facility] [place] [facility]  [address] [name]  [place] [address]  Dept: [phone]  Loc: [phone]    Visit Date: 12/09/2020        HPI:     Michelle Bright is a 70 y.o. female who presents today for:  Chief Complaint   Patient presents with    Urinary Frequency    Dysuria       HPI  Michelle Bright seen in follow up for UTI and urinary retention.     Pt has a hx of recurrent UTIs previously treated with D mannose.  Pt previously tried oxybutynin, trospium, and Myrbetriq for her OAB without improvement.  Underwent interstim stage I by Dr. Welton Flakes 02/11/20 and stage II placement 02/25/20.       Presented to office in October with pain, frequency, urgency, and urinary retention.  Found to have E coli UTI and urinary retention.  Foley was inserted on 11/04/20 and removed 11/10/20.  She was treated with Omnicef and PVR check 11/11/20 104 mls.      Here today in follow-up.      Reports frequency, urgency, feeling of incomplete emptying have resolved.  Notes had small amount of burning with urination this morning.  No fever/chills.  No hematuria.  Wondering if ginger ale she drank yesterday may have contributed to the burning.      Current Outpatient Medications   Medication Sig Dispense Refill    sacubitril-valsartan (ENTRESTO) 49-51 MG per tablet Take 1 tablet by mouth 2 times daily      Multiple Vitamins-Minerals (THERAPEUTIC MULTIVITAMIN-MINERALS) tablet Take 1 tablet by mouth daily      Cholecalciferol (VITAMIN D3) 50 MCG (2000 UT) CAPS Take by mouth      D-Mannose 500 MG CAPS Take by mouth 2 times daily       Ascorbic Acid (VITAMIN C) 500 MG CHEW Take 500 mg by mouth daily      metoprolol succinate (TOPROL XL) 50 MG extended release tablet Take 50 mg by mouth daily      furosemide (LASIX) 20 MG tablet 20 mg Patient taking 1/2 tablet to 1 tablet daily      potassium chloride (KLOR-CON) 10 MEQ extended release tablet       nitroGLYCERIN (NITROSTAT) 0.4 MG SL tablet up to max  of 3 total doses. If no relief after 1 dose, call 911. 25 tablet 3    acetaminophen (TYLENOL) 500 MG tablet Take 500 mg by mouth every 6 hours as needed for Pain      warfarin (COUMADIN) 3 MG tablet Take 3 mg by mouth daily       levothyroxine (SYNTHROID) 100 MCG tablet Take 100 mcg by mouth Daily       aspirin 81 MG tablet Take 81 mg by mouth daily      IRON PO Take 65 mg by mouth 2 times daily      metOLazone (ZAROXOLYN) 2.5 MG tablet  (Patient not taking: Reported on 12/09/2020)      phenazopyridine (PYRIDIUM) 100 MG tablet Take 1 tablet by mouth 3 times daily as needed for Pain (for burning with urination) (Patient not taking: Reported on 12/09/2020) 9 tablet 0     No current facility-administered medications for this visit.       Past Medical History  Michelle Bright  has a past medical history of Arthritis, Atrial fibrillation (HCC), CAD (coronary artery disease), Cerebral artery occlusion with cerebral infarction [facility]), Hypertension,  Osteoporosis, and Thyroid disease.    Past Surgical History  The patient  has a past surgical history that includes Femur Surgery (12/2014); Thyroid surgery (2005); fracture surgery; Stimulator Surgery (N/A, 02/11/2020); and Stimulator Surgery (N/A, 02/25/2020).    Family History  This patient's family history is not on file.    Social History  Michelle Bright  reports that she quit smoking about 28 years ago. Her smoking use included cigarettes. She has never used smokeless tobacco. She reports that she does not currently use alcohol. She reports that she does not use drugs.      Subjective:      Review of Systems   Constitutional:  Negative for activity change, appetite change, chills, diaphoresis, fatigue, fever and unexpected weight change.   Gastrointestinal:  Negative for abdominal pain, nausea and vomiting.   Genitourinary:  Negative for decreased urine volume, difficulty urinating, dysuria, flank pain, frequency, hematuria and urgency.   Musculoskeletal:  Negative for back pain.      Objective:   BP 138/82    Ht 4\' 11"  (1.499 m)    Wt 99 lb (44.9 kg)    BMI 20.00 kg/m??     Physical Exam  Vitals reviewed.   Constitutional:       General: She is not in acute distress.     Appearance: Normal appearance. She is well-developed. She is not ill-appearing or diaphoretic.   HENT:      Head: Normocephalic and atraumatic.      Right Ear: External ear normal.      Left Ear: External ear normal.      Nose: Nose normal.      Mouth/Throat:      Mouth: Mucous membranes are moist.   Eyes:      General: No scleral icterus.        Right eye: No discharge.         Left eye: No discharge.   Neck:      Vascular: No JVD.      Trachea: No tracheal deviation.   Pulmonary:      Effort: Pulmonary effort is normal. No respiratory distress.   Musculoskeletal:         General: No tenderness. Normal range of motion.   Neurological:      Mental Status: She is alert and oriented to person, place, and time. Mental status is at baseline.   Psychiatric:         Mood and Affect: Mood normal.         Behavior: Behavior normal.         Thought Content: Thought content normal.       POC  Results for POC orders placed in visit on 12/09/20   poct post void residual   Result Value Ref Range    post void residual 67 ml         Patients recent PSA values are as follows  No results found for: PSA, PSADIA     Recent BUN/Creatinine:  Lab Results   Component Value Date/Time    BUN 19 01/28/2020 08:43 AM    CREATININE 0.7 01/28/2020 08:43 AM       Assessment:   Acute cystitis  Urinary retention  OAB  Hx recurrent UTIs  Nocturia    Plan:     Pt's PVR today only 67 mls and she was subsequently able to void after measurement.  Urine with blood and leuks--will send for culture and start antibiotic if significant for infection.  Continue increased oral fluid intake. Barrier ointment to periarea.  Increase D mannose to 500 mg TID.  Avoid foods and drinks that can be irritating to the bladder.  Start Pyridium prn for burning.      F/u in 4  weeks with PVR

## 2020-12-11 LAB — CULTURE, URINE

## 2020-12-11 MED ORDER — CEFDINIR 300 MG PO CAPS
300 MG | ORAL_CAPSULE | Freq: Two times a day (BID) | ORAL | 0 refills | Status: AC
Start: 2020-12-11 — End: 2020-12-25

## 2020-12-11 NOTE — Telephone Encounter (Signed)
Patient husband advised of the urine results. They would like antibiotics ordered.    Patient still has burning and started azo on Wednesday. Can she continue it she has already used 9 tablets total?

## 2020-12-11 NOTE — Telephone Encounter (Signed)
Ok to continue the Azo at this time until symptoms improve with antibiotic therapy.  Script for [employer] sent to pharmacy.

## 2020-12-11 NOTE — Telephone Encounter (Signed)
Attempted to call the patient. The line is busy

## 2020-12-11 NOTE — Telephone Encounter (Signed)
Rocky advised may continue the azo until symptoms improve with Omnicef. He voiced understanding.

## 2020-12-11 NOTE — Telephone Encounter (Signed)
Pt's urine culture with mixed growth.  Is she having any symptoms of UTI at this time?  If so I can send in some further empiric antibiotics but if no symptoms would hold off on antibiotic therapy.

## 2021-01-13 ENCOUNTER — Ambulatory Visit: Admit: 2021-01-13 | Discharge: 2021-01-13 | Payer: MEDICARE | Attending: Registered Nurse | Primary: Family Medicine

## 2021-01-13 DIAGNOSIS — R350 Frequency of micturition: Secondary | ICD-10-CM

## 2021-01-13 LAB — POST VOID RESIDUAL (PVR): post void residual: 45 ml

## 2021-01-13 NOTE — Addendum Note (Signed)
Addended by: Gevena Mart on: 01/13/2021 10:49 AM     Modules accepted: Orders

## 2021-01-13 NOTE — Progress Notes (Signed)
[facility] [place] [facility] [place] [facility]  [address] [name]  [place] [address]  Dept: [phone]  Loc: [phone]    Visit Date: 01/13/2021        HPI:     Michelle Bright is a 70 y.o. female who presents today for:  Chief Complaint   Patient presents with    Urinary Frequency       HPI  Michelle Bright seen in follow up for UTI and urinary retention.      Pt has a hx of recurrent UTIs previously treated with D mannose.  Pt previously tried oxybutynin, trospium, and Myrbetriq for her OAB without improvement.  Underwent interstim stage I by Dr. Welton Flakes 02/11/20 and stage II placement 02/25/20.        Presented to office in October with pain, frequency, urgency, and urinary retention.  Found to have E coli UTI and urinary retention.  Foley was inserted on 11/04/20 and removed 11/10/20.  She was treated with Omnicef and PVR check 11/11/20 104 mls.  Still had symptoms when seen in the office in November and urine showed mixed growth.  Treated with additional course of omnicef and reports burning and symptoms have now resolved.  Denies any further symptoms of UTI.  Emptying well.  Notes urination back to baseline.       Current Outpatient Medications   Medication Sig Dispense Refill    lisinopril (PRINIVIL;ZESTRIL) 20 MG tablet Take 20 mg by mouth daily      atorvastatin (LIPITOR) 40 MG tablet Take 40 mg by mouth at bedtime      Multiple Vitamins-Minerals (THERAPEUTIC MULTIVITAMIN-MINERALS) tablet Take 1 tablet by mouth daily      Cholecalciferol (VITAMIN D3) 50 MCG (2000 UT) CAPS Take by mouth      D-Mannose 500 MG CAPS Take by mouth 2 times daily       Ascorbic Acid (VITAMIN C) 500 MG CHEW Take 500 mg by mouth daily      metoprolol succinate (TOPROL XL) 50 MG extended release tablet Take 50 mg by mouth daily      furosemide (LASIX) 20 MG tablet 20 mg Patient taking 1/2 tablet to 1 tablet daily      potassium chloride (KLOR-CON) 10 MEQ extended release tablet       nitroGLYCERIN (NITROSTAT)  0.4 MG SL tablet up to max of 3 total doses. If no relief after 1 dose, call 911. 25 tablet 3    acetaminophen (TYLENOL) 500 MG tablet Take 500 mg by mouth every 6 hours as needed for Pain      warfarin (COUMADIN) 3 MG tablet Take 3 mg by mouth daily       levothyroxine (SYNTHROID) 100 MCG tablet Take 125 mcg by mouth Daily      aspirin 81 MG tablet Take 81 mg by mouth daily      IRON PO Take 65 mg by mouth 2 times daily      sacubitril-valsartan (ENTRESTO) 49-51 MG per tablet Take 1 tablet by mouth 2 times daily (Patient not taking: Reported on 01/13/2021)      phenazopyridine (PYRIDIUM) 100 MG tablet Take 1 tablet by mouth 3 times daily as needed for Pain (for burning with urination) (Patient not taking: Reported on 01/13/2021) 9 tablet 0    metOLazone (ZAROXOLYN) 2.5 MG tablet  (Patient not taking: No sig reported)       No current facility-administered medications for this visit.  Past Medical History  Michelle Bright  has a past medical history of Arthritis, Atrial fibrillation (HCC), CAD (coronary artery disease), Cerebral artery occlusion with cerebral infarction [facility]), Hypertension, Osteoporosis, and Thyroid disease.    Past Surgical History  The patient  has a past surgical history that includes Femur Surgery (12/2014); Thyroid surgery (2005); fracture surgery; Stimulator Surgery (N/A, 02/11/2020); and Stimulator Surgery (N/A, 02/25/2020).    Family History  This patient's family history is not on file.    Social History  Michelle Bright  reports that she quit smoking about 28 years ago. Her smoking use included cigarettes. She has never used smokeless tobacco. She reports that she does not currently use alcohol. She reports that she does not use drugs.      Subjective:      Review of Systems   Constitutional:  Negative for activity change, appetite change, chills, diaphoresis, fatigue, fever and unexpected weight change.   Gastrointestinal:  Negative for abdominal pain, nausea and vomiting.   Genitourinary:  Negative  for decreased urine volume, difficulty urinating, dysuria, flank pain, frequency, hematuria and urgency.   Musculoskeletal:  Negative for back pain.     Objective:   BP (!) 140/84    Ht 4\' 11"  (1.499 m)    Wt 98 lb (44.5 kg)    BMI 19.79 kg/m??     Physical Exam  Vitals reviewed.   Constitutional:       General: She is not in acute distress.     Appearance: Normal appearance. She is well-developed. She is not ill-appearing or diaphoretic.   HENT:      Head: Normocephalic and atraumatic.      Right Ear: External ear normal.      Left Ear: External ear normal.      Nose: Nose normal.      Mouth/Throat:      Mouth: Mucous membranes are moist.   Eyes:      General: No scleral icterus.        Right eye: No discharge.         Left eye: No discharge.   Neck:      Vascular: No JVD.      Trachea: No tracheal deviation.   Cardiovascular:      Rate and Rhythm: Normal rate and regular rhythm.   Pulmonary:      Effort: Pulmonary effort is normal. No respiratory distress.   Abdominal:      General: There is no distension.      Tenderness: There is no abdominal tenderness. There is no right CVA tenderness or left CVA tenderness.   Musculoskeletal:         General: No tenderness. Normal range of motion.   Skin:     General: Skin is warm and dry.   Neurological:      Mental Status: She is alert and oriented to person, place, and time. Mental status is at baseline.   Psychiatric:         Mood and Affect: Mood normal.         Behavior: Behavior normal.         Thought Content: Thought content normal.       POC  Results for POC orders placed in visit on 01/13/21   poct post void residual   Result Value Ref Range    post void residual 45 ml         Patients recent PSA values are as follows  No results found for: PSA, PSADIA  Recent BUN/Creatinine:  Lab Results   Component Value Date/Time    BUN 19 01/28/2020 08:43 AM    CREATININE 0.7 01/28/2020 08:43 AM       Assessment:   Recent un-resolving cystitis  Urinary retention  OAB  Hx  recurrent UTIs  Nocturia    Plan:     Pt reports symptoms have resolved after second course of antibiotics.  Doing well.  Urination back to baseline and PVR only 45 mls in office today.      Continue increased oral fluid intake and d mannose.      F/u in 6 months with PVR.  Call with any worsening in symptoms or infections prior.

## 2021-03-02 ENCOUNTER — Ambulatory Visit: Admit: 2021-03-02 | Discharge: 2021-03-02 | Payer: MEDICARE | Attending: Registered Nurse | Primary: Family Medicine

## 2021-03-02 ENCOUNTER — Encounter

## 2021-03-02 DIAGNOSIS — N390 Urinary tract infection, site not specified: Secondary | ICD-10-CM

## 2021-03-02 LAB — POCT URINALYSIS DIPSTICK W/O MICROSCOPE (AUTO)
Bilirubin Urine: NEGATIVE
Glucose, Ur: NEGATIVE mg/dl
Ketones, Urine: NEGATIVE
Leukocyte Clumps, Urine: NEGATIVE
Nitrite, Urine: NEGATIVE
Protein, Urine: >=300 mg/dl — AB
Specific Gravity, Urine: >=1.03 (ref 1.002–1.030)
Urobilinogen, Urine: 0.2 eu/dl (ref 0.0–1.0)
pH, Urine: 5.5 (ref 5.0–9.0)

## 2021-03-02 LAB — POST VOID RESIDUAL (PVR): post void residual: 16 ml

## 2021-03-02 NOTE — Telephone Encounter (Signed)
Referral to gynecology for possible pessary placement.  Referral to GI for blood in the stool per pt report.   Patient is calling the office back this afternoon to give [place] the doctors that they want to be referred to.

## 2021-03-02 NOTE — Progress Notes (Signed)
[facility] [place] [facility]  [facility]  [address]  [address]  [place] [address]  Dept: [phone]  Loc: [phone]    Visit Date: 03/02/2021        HPI:     Michelle Bright is a 71 y.o. female who presents today for:  Chief Complaint   Patient presents with    Follow-up     ER follow up-went to ER for burning, frequency and flank pain. CT scan that showed prolapsed bladder. Currently on ATB for UTI       HPI  Pt seen in ER follow up for UTI.    Pt has a hx of recurrent UTIs previously treated with D mannose.  Pt previously tried oxybutynin, trospium, and Myrbetriq for her OAB without improvement.  Underwent interstim stage I by Dr. Welton Flakes 02/11/20 and stage II placement 02/25/20.       Had episode of severe UTI in October with urinary retention and required foley insertion for 6 days.  Subsequent PVR check was acceptable after removal.      Reports she was seen in outside facility ER 2/3 for dysuria, suprapubic pain, and diagnosed with UTI.  ER physician reported to her and husband that she had a prolapsed bladder.  Pt reports she has been having GI bleeding.      Current Outpatient Medications   Medication Sig Dispense Refill    cephALEXin (KEFLEX) 500 MG capsule       lisinopril (PRINIVIL;ZESTRIL) 20 MG tablet Take 20 mg by mouth daily      atorvastatin (LIPITOR) 40 MG tablet Take 40 mg by mouth at bedtime      Multiple Vitamins-Minerals (THERAPEUTIC MULTIVITAMIN-MINERALS) tablet Take 1 tablet by mouth daily      Cholecalciferol (VITAMIN D3) 50 MCG (2000 UT) CAPS Take by mouth      D-Mannose 500 MG CAPS Take by mouth 2 times daily       Ascorbic Acid (VITAMIN C) 500 MG CHEW Take 500 mg by mouth daily      metoprolol succinate (TOPROL XL) 50 MG extended release tablet Take 50 mg by mouth daily      furosemide (LASIX) 20 MG tablet 20 mg Patient taking 1/2 tablet to 1 tablet daily      potassium chloride (KLOR-CON) 10 MEQ extended release tablet       nitroGLYCERIN (NITROSTAT)  0.4 MG SL tablet up to max of 3 total doses. If no relief after 1 dose, call 911. 25 tablet 3    acetaminophen (TYLENOL) 500 MG tablet Take 500 mg by mouth every 6 hours as needed for Pain      warfarin (COUMADIN) 3 MG tablet Take 3 mg by mouth daily       levothyroxine (SYNTHROID) 100 MCG tablet Take 125 mcg by mouth Daily      aspirin 81 MG tablet Take 81 mg by mouth daily      IRON PO Take 65 mg by mouth 2 times daily      sacubitril-valsartan (ENTRESTO) 49-51 MG per tablet Take 1 tablet by mouth 2 times daily (Patient not taking: No sig reported)      phenazopyridine (PYRIDIUM) 100 MG tablet Take 1 tablet by mouth 3 times daily as needed for Pain (for burning with urination) (Patient not taking: No sig reported) 9 tablet 0    metOLazone (ZAROXOLYN) 2.5 MG tablet  (Patient not taking: No sig reported)  No current facility-administered medications for this visit.       Past Medical History  Michelle Bright  has a past medical history of Arthritis, Atrial fibrillation (HCC), CAD (coronary artery disease), Cerebral artery occlusion with cerebral infarction [facility]), Hypertension, Osteoporosis, and Thyroid disease.    Past Surgical History  The patient  has a past surgical history that includes Femur Surgery (12/2014); Thyroid surgery (2005); fracture surgery; Stimulator Surgery (N/A, 02/11/2020); and Stimulator Surgery (N/A, 02/25/2020).    Family History  This patient's family history is not on file.    Social History  Michelle Bright  reports that she quit smoking about 29 years ago. Her smoking use included cigarettes. She has never used smokeless tobacco. She reports that she does not currently use alcohol. She reports that she does not use drugs.      Subjective:      Review of Systems   Constitutional:  Negative for activity change, appetite change, chills, diaphoresis, fatigue, fever and unexpected weight change.   Gastrointestinal:  Negative for abdominal pain, nausea and vomiting.   Genitourinary:  Negative for decreased  urine volume, difficulty urinating, dysuria, flank pain, frequency, hematuria and urgency.   Musculoskeletal:  Negative for back pain.     Objective:   BP (!) 142/72    Ht  (1.499 m)    Wt 99 lb (44.9 kg)    BMI 20.00 kg/m??     Physical Exam  Vitals reviewed.   Constitutional:       General: She is not in acute distress.     Appearance: Normal appearance. She is well-developed. She is not ill-appearing or diaphoretic.   HENT:      Head: Normocephalic and atraumatic.      Right Ear: External ear normal.      Left Ear: External ear normal.      Nose: Nose normal.      Mouth/Throat:      Mouth: Mucous membranes are moist.   Eyes:      General: No scleral icterus.        Right eye: No discharge.         Left eye: No discharge.   Neck:      Vascular: No JVD.      Trachea: No tracheal deviation.   Cardiovascular:      Rate and Rhythm: Normal rate and regular rhythm.   Pulmonary:      Effort: Pulmonary effort is normal. No respiratory distress.   Abdominal:      General: There is no distension.      Tenderness: There is no abdominal tenderness. There is no right CVA tenderness or left CVA tenderness.   Musculoskeletal:         General: No tenderness. Normal range of motion.   Skin:     General: Skin is warm and dry.   Neurological:      Mental Status: She is alert and oriented to person, place, and time. Mental status is at baseline.   Psychiatric:         Mood and Affect: Mood normal.         Behavior: Behavior normal.         Thought Content: Thought content normal.       POC  Results for POC orders placed in visit on 03/02/21   POCT Urinalysis No Micro (Auto)   Result Value Ref Range    Glucose, Ur Negative NEGATIVE mg/dl    Bilirubin Urine Negative  Ketones, Urine Negative NEGATIVE    Specific Gravity, Urine >= 1.030 1.002 - 1.030    Blood, UA POC Large (A) NEGATIVE    pH, Urine 5.50 5.0 - 9.0    Protein, Urine >= 300 (A) NEGATIVE mg/dl    Urobilinogen, Urine 0.20 0.0 - 1.0 eu/dl    Nitrite, Urine Negative  NEGATIVE    Leukocyte Clumps, Urine Negative NEGATIVE    Color, Urine Dark yellow (A) YELLOW-STRAW    Character, Urine Clear [id number]   poct post void residual   Result Value Ref Range    post void residual 16 ml         Patients recent PSA values are as follows  No results found for: PSA, PSADIA     Recent BUN/Creatinine:  Lab Results   Component Value Date/Time    BUN 19 01/28/2020 08:43 AM    CREATININE 0.7 01/28/2020 08:43 AM     CT ABDOMEN and PELVIS W/ IV CONTRAST Patient Name: Michelle Bright MR #: [id number]   Date of Birth: July 27, 1950 Gender: F   Service Type: ER Pt. Type: E   Ordering Phys: Raynelle Chary PERKINS DO Account #: [id number]   Admitting Phys: Accession #: [id number]   Family Phys:   Additional CC Phys:       MEDICAL IMAGING REPORT           EXAM: CT ABDOMEN and PELVIS W/ IV CONTRAST     Exam Date: 02/26/2021 Exam Time: 06:25:10     CLINICAL HISTORY: Abdominal Pain     TECH ACQUIRED HISTORY: frequent uncontrolled urination, pelvic pressure starting around 2300 last night, bladder stimulator placed approximately 1 year ago, no sxno oral contrast     EXAMINATION:   CT OF THE ABDOMEN AND PELVIS WITH CONTRAST 02/26/2021 6:25 am     TECHNIQUE:   CT of the abdomen and pelvis was performed with the administration of   intravenous contrast. Multiplanar reformatted images are provided for review.   Automated exposure control, iterative reconstruction, and/or weight based   adjustment of the mA/kV was utilized to reduce the radiation dose to as low   as reasonably achievable.     COMPARISON:   None.     HISTORY:   Reason for exam: : Abdominal Pain Note: 1 - ORDER COMMENTS uterine prolapse   Note: 3 - TECH HISTORY frequent uncontrolled urination, pelvic pressure   starting around 2300 last night, bladder stimulator placed approximately 1   year ago, no sxno oral contrast     FINDINGS:   Lower Chest: Mild dependent subsegmental atelectasis. Mild-to-moderate   cardiomegaly.     Organs: Normal liver. Normal  gallbladder. No evidence of biliary ductal   dilatation. A few subcentimeter hypodensities in the spleen that are too   small to characterize but most likely represent simple cysts that do not   require follow up imaging.. Small accessory spleen along the anterior aspect   of the spleen. Normal pancreas. No evidence of ductal dilatation. Normal   adrenal glands. A simple cyst in the right kidney. The left kidney is   normal. No renal calculi or hydronephrosis.     Pelvis: Diffuse bladder wall thickening with perivesicular fat stranding.   Normal uterus.     GI/Bowel: Small hiatal hernia, otherwise normal stomach. Wall thickening of a   few small and large loops in the pelvis adjacent to bladder. Normal colon.   Appendix is normal.     Peritoneum/Retroperitoneum:Trace free fluid  within the pelvis. No free air,   organized fluid collection or lymphadenopathy. Moderate calcific   atherosclerosis.     Soft tissues: Sacral stimulator in place overlying the right lower back.     Bones: Diffuse demineralization with multilevel degenerative disc disease.   Age-indeterminate mild compression deformity at T11 and L4. Multilevel facet   arthropathy.     IMPRESSION:   Diffuse bladder wall thickening with perivesicular fat stranding suggestive   of cystitis. Correlation with urinalysis recommended.     Wall thickening of a few small and large bowel loops within the pelvis   suggestive of enterocolitis.     Age-indeterminate mild compression deformities at T11 and L4.     Mild to moderate cardiomegaly.       D/T: 02/26/2021 7:26:34 AM / /     Interpreting Provider:   Electronically signed by Luna Fuse on 02/26/2021 7:41:06 AM     Patient: Michelle Bright, Michelle Bright MRN: [id number] Date of Service: 02/26/2021  Normal     Assessment:   Acute cystitis  Urinary retention  OAB  Hx recurrent UTIs  GI bleeding  Plan:     Pt's UTI symptoms improving on Cipro.  Urine culture with mixed growth.  Pt's PVR today 16 mls but pt noted to have bladder prolapse  on ED exam, having increased UTIs, and having intermittent episodes of retention.      Referral to gynecology for possible POP and pessary placement.      Continue Cipro to completion.  Send urine today for culture.     Pt notes episodes of GI bleeding.  Referral to GI.      F/u in 4-8 weeks with PVR.

## 2021-03-03 LAB — CULTURE, URINE

## 2021-03-03 NOTE — Telephone Encounter (Signed)
Rocky advised of the urine results and to continue the cipro until completed. He voiced understanding.

## 2021-03-03 NOTE — Telephone Encounter (Signed)
Pt's urine culture with growth of contaminants.  Continue Cipro to completion.

## 2021-04-21 ENCOUNTER — Ambulatory Visit: Admit: 2021-04-21 | Discharge: 2021-04-21 | Payer: MEDICARE | Attending: Registered Nurse | Primary: Family Medicine

## 2021-04-21 DIAGNOSIS — N390 Urinary tract infection, site not specified: Secondary | ICD-10-CM

## 2021-04-21 LAB — POST VOID RESIDUAL (PVR): post void residual: 51 ml

## 2021-04-21 NOTE — Progress Notes (Signed)
Michelle Bright  [address] [name]  [place] [address]  Dept: [phone]  Loc: [phone]    Visit Date: 04/21/2021        HPI:     Michelle Bright is a 71 y.o. female who presents today for:  Chief Complaint   Patient presents with    Follow-up     Patient has seen OBGYN and GI since last visit     Urinary Tract Infection       HPI  Pt seen in follow up for recurrent UTI.     Pt has a hx of recurrent UTIs previously treated with D mannose.  Pt previously tried oxybutynin, trospium, and Myrbetriq for her OAB without improvement.  Underwent interstim stage I by Dr. Welton Flakes 02/11/20 and stage II placement 02/25/20.        Had episode of severe UTI in October with urinary retention and required foley insertion for 6 days.  Subsequent PVR check was acceptable after removal.       Reports she was seen in outside facility ER 2/3 for dysuria, suprapubic pain, and diagnosed with UTI.  ER physician reported to her and husband that she had a prolapsed bladder.  Referred to gynecology and since last appt has been fitted with a pessary.  Reports she is tolerating the pessary well and urinary retention is resolved.  No further infections.      Doing well today.  Denies fever/chills, abdominal or new back pain, dysuria, frequency, urgency, trouble emptying.      Accompanied to appt today by her husband Michelle Bright.      Current Outpatient Medications   Medication Sig Dispense Refill    lisinopril (PRINIVIL;ZESTRIL) 20 MG tablet Take 20 mg by mouth daily      Multiple Vitamins-Minerals (THERAPEUTIC MULTIVITAMIN-MINERALS) tablet Take 1 tablet by mouth daily      Cholecalciferol (VITAMIN D3) 50 MCG (2000 UT) CAPS Take by mouth      D-Mannose 500 MG CAPS Take by mouth 2 times daily       Ascorbic Acid (VITAMIN C) 500 MG CHEW Take 500 mg by mouth daily      metoprolol succinate (TOPROL XL) 50 MG extended release tablet Take 50 mg by mouth daily      furosemide (LASIX) 20 MG tablet 20  mg Patient taking 1/2 tablet to 1 tablet daily      potassium chloride (KLOR-CON) 10 MEQ extended release tablet       nitroGLYCERIN (NITROSTAT) 0.4 MG SL tablet up to max of 3 total doses. If no relief after 1 dose, call 911. 25 tablet 3    acetaminophen (TYLENOL) 500 MG tablet Take 500 mg by mouth every 6 hours as needed for Pain      warfarin (COUMADIN) 3 MG tablet Take 3 mg by mouth daily       levothyroxine (SYNTHROID) 100 MCG tablet Take 125 mcg by mouth Daily      aspirin 81 MG tablet Take 81 mg by mouth daily      atorvastatin (LIPITOR) 40 MG tablet Take 40 mg by mouth at bedtime (Patient not taking: Reported on 04/21/2021)      sacubitril-valsartan (ENTRESTO) 49-51 MG per tablet Take 1 tablet by mouth 2 times daily (Patient not taking: No sig reported)      phenazopyridine (PYRIDIUM) 100 MG tablet Take 1 tablet by mouth 3 times daily as needed for Pain (for burning  with urination) (Patient not taking: No sig reported) 9 tablet 0    metOLazone (ZAROXOLYN) 2.5 MG tablet  (Patient not taking: No sig reported)      IRON PO Take 65 mg by mouth 2 times daily (Patient not taking: Reported on 04/21/2021)       No current facility-administered medications for this visit.       Past Medical History  Michelle Bright  has a past medical history of Arthritis, Atrial fibrillation (HCC), CAD (coronary artery disease), Cerebral artery occlusion with cerebral infarction (HCC), Hypertension, Osteoporosis, Prolapse of female bladder, acquired, and Thyroid disease.    Past Surgical History  The patient  has a past surgical history that includes Femur Surgery (12/2014); Thyroid surgery (2005); fracture surgery; Stimulator Surgery (N/A, 02/11/2020); Stimulator Surgery (N/A, 02/25/2020); and Bladder surgery (03/11/2021).    Family History  This patient's family history is not on file.    Social History  Michelle Bright  reports that she quit smoking about 29 years ago. Her smoking use included cigarettes. She has never used smokeless tobacco. She  reports that she does not currently use alcohol. She reports that she does not use drugs.      Subjective:      Review of Systems   Constitutional:  Negative for activity change, appetite change, chills, diaphoresis, fatigue, fever and unexpected weight change.   Gastrointestinal:  Negative for abdominal pain, nausea and vomiting.   Genitourinary:  Negative for decreased urine volume, difficulty urinating, dysuria, flank pain, frequency, hematuria and urgency.   Musculoskeletal:  Negative for back pain.     Objective:   BP (!) 140/78    Ht 4\' 11"  (1.499 m)    Wt 101 lb (45.8 kg)    BMI 20.40 kg/m??     Physical Exam  Vitals reviewed.   Constitutional:       General: She is not in acute distress.     Appearance: Normal appearance. She is well-developed. She is not ill-appearing or diaphoretic.   HENT:      Head: Normocephalic and atraumatic.      Right Ear: External ear normal.      Left Ear: External ear normal.      Nose: Nose normal.      Mouth/Throat:      Mouth: Mucous membranes are moist.   Eyes:      General: No scleral icterus.        Right eye: No discharge.         Left eye: No discharge.   Neck:      Vascular: No JVD.      Trachea: No tracheal deviation.   Pulmonary:      Effort: Pulmonary effort is normal. No respiratory distress.   Abdominal:      General: There is no distension.      Tenderness: There is no abdominal tenderness. There is no right CVA tenderness or left CVA tenderness.   Musculoskeletal:         General: No tenderness. Normal range of motion.   Skin:     General: Skin is warm and dry.   Neurological:      Mental Status: She is alert and oriented to person, place, and time. Mental status is at baseline.   Psychiatric:         Mood and Affect: Mood normal.         Behavior: Behavior normal.         Thought Content: Thought content normal.  POC  Results for POC orders placed in visit on 04/21/21   poct post void residual   Result Value Ref Range    post void residual 51 ml          Patients recent PSA values are as follows  No results found for: PSA, PSADIA     Recent BUN/Creatinine:  Lab Results   Component Value Date/Time    BUN 19 01/28/2020 08:43 AM    CREATININE 0.7 01/28/2020 08:43 AM     IV CONTRAST Patient Name: Michelle Bright MR #: [id number]   Date of Birth: [birth year] Gender: F   Service Type: ER Pt. Type: E   Ordering Phys: Raynelle Chary PERKINS DO Account #: [id number]   Admitting Phys: Accession #: [id number]   Family Phys:   Additional CC Phys:       MEDICAL IMAGING REPORT           EXAM: CT ABDOMEN and PELVIS W/ IV CONTRAST     Exam Date: 02/26/2021 Exam Time: 06:25:10     CLINICAL HISTORY: Abdominal Pain     TECH ACQUIRED HISTORY: frequent uncontrolled urination, pelvic pressure starting around 2300 last night, bladder stimulator placed approximately 1 year ago, no sxno oral contrast     EXAMINATION:   CT OF THE ABDOMEN AND PELVIS WITH CONTRAST 02/26/2021 6:25 am     TECHNIQUE:   CT of the abdomen and pelvis was performed with the administration of   intravenous contrast. Multiplanar reformatted images are provided for review.   Automated exposure control, iterative reconstruction, and/or weight based   adjustment of the mA/kV was utilized to reduce the radiation dose to as low   as reasonably achievable.     COMPARISON:   None.     HISTORY:   Reason for exam: : Abdominal Pain Note: 1 - ORDER COMMENTS uterine prolapse   Note: 3 - TECH HISTORY frequent uncontrolled urination, pelvic pressure   starting around 2300 last night, bladder stimulator placed approximately 1   year ago, no sxno oral contrast     FINDINGS:   Lower Chest: Mild dependent subsegmental atelectasis. Mild-to-moderate   cardiomegaly.     Organs: Normal liver. Normal gallbladder. No evidence of biliary ductal   dilatation. A few subcentimeter hypodensities in the spleen that are too   small to characterize but most likely represent simple cysts that do not   require follow up imaging.. Small accessory  spleen along the anterior aspect   of the spleen. Normal pancreas. No evidence of ductal dilatation. Normal   adrenal glands. A simple cyst in the right kidney. The left kidney is   normal. No renal calculi or hydronephrosis.     Pelvis: Diffuse bladder wall thickening with perivesicular fat stranding.   Normal uterus.     GI/Bowel: Small hiatal hernia, otherwise normal stomach. Wall thickening of a   few small and large loops in the pelvis adjacent to bladder. Normal colon.   Appendix is normal.     Peritoneum/Retroperitoneum:Trace free fluid within the pelvis. No free air,   organized fluid collection or lymphadenopathy. Moderate calcific   atherosclerosis.     Soft tissues: Sacral stimulator in place overlying the right lower back.     Bones: Diffuse demineralization with multilevel degenerative disc disease.   Age-indeterminate mild compression deformity at T11 and L4. Multilevel facet   arthropathy.     IMPRESSION:   Diffuse bladder wall thickening with perivesicular fat stranding suggestive  of cystitis. Correlation with urinalysis recommended.     Wall thickening of a few small and large bowel loops within the pelvis   suggestive of enterocolitis.     Age-indeterminate mild compression deformities at T11 and L4.     Mild to moderate cardiomegaly.       D/T: 02/26/2021 7:26:34 AM / /     Interpreting Provider:   Electronically signed by Luna Fuse on 02/26/2021 7:41:06 AM     Patient: Michelle Bright, Michelle Bright MRN: [id number] Date of Service: 02/26/2021  Normal          Assessment:   Urinary retention, resolved  OAB  Hx recurrent UTIs  POP s/p pessary  GI bleeding--following with GI.  Currently resolved.    Plan:     Pt's symptoms well controlled at this time.  No symptoms of infection since prior to last visit.  PVR today acceptable.  Continue D mannose.  If develops further infections consider addition of Hiprex.  F/u in 6 months with PVR

## 2021-04-22 NOTE — Telephone Encounter (Signed)
Patient seen [name]  on 04-21-21 she wants [place] to obtain records/notes from her OB and GI.   Seen Michelle Bright-[facility]  Seen Michelle Bright-[facility]

## 2021-04-26 NOTE — Telephone Encounter (Signed)
Records have been requested from both offices

## 2021-04-28 NOTE — Telephone Encounter (Signed)
All records received and scanned to patient's chart

## 2021-07-14 ENCOUNTER — Encounter: Payer: MEDICARE | Attending: Registered Nurse | Primary: Family Medicine

## 2021-07-26 IMAGING — CT CT ABD/PEL W CONT
2 of 4 series · 11 of 46 positions shown, 12 images · non-contrast
Comparison: None available.

Images Obtained from [facility]
INDICATION: Right lower quadrant rebound abdominal tenderness
TECHNIQUE: Multiple axial CT images of the abdomen and pelvis were performed after the intravenous administration of 100 mL of Rsovue-K22 contrast. Coronal and sagittal reformatted images were
obtained and interpreted. I-STAT creatinine:
Dose reduction technique used: Automated exposure control and adjustment of the mA and/or kV according to patient size. CT Studies and Cardiac Nuclear Medicine Studies in last 12 months = 0
Total radiation dose to patient is CTDIvol 14.51 mGy and DLP 481.90 mGy-cm.

[Series 4: soft tissue · axial · 0.59mm/px · z∈[-1732,-1348]mm · 8 of 95 slices shown, 9 images]
[im 9/95  soft-tissue]
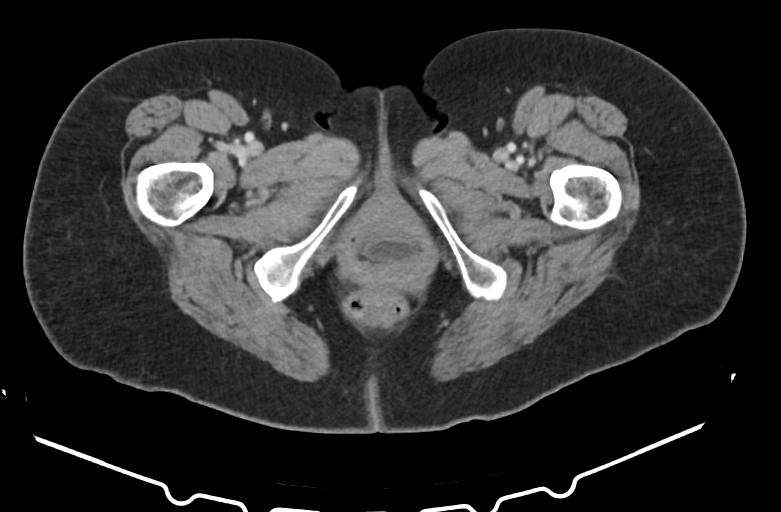
[im 9/95  bone]
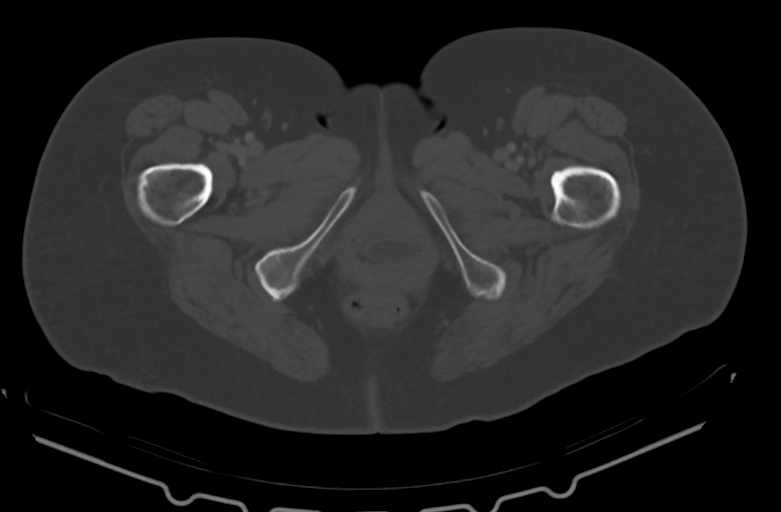
[im 18/95  soft-tissue]
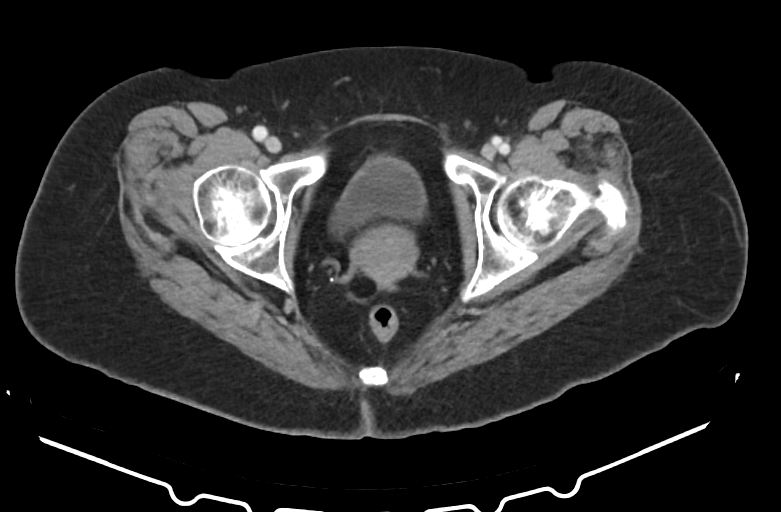
[im 30/95  soft-tissue]
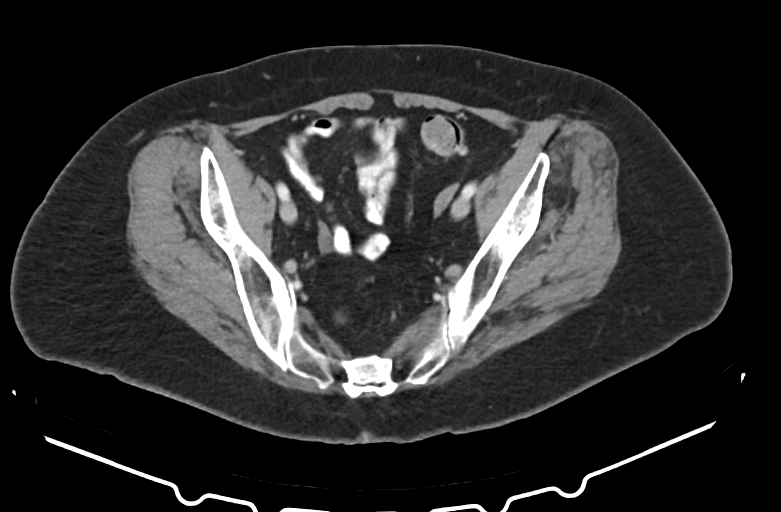
[im 43/95  soft-tissue]
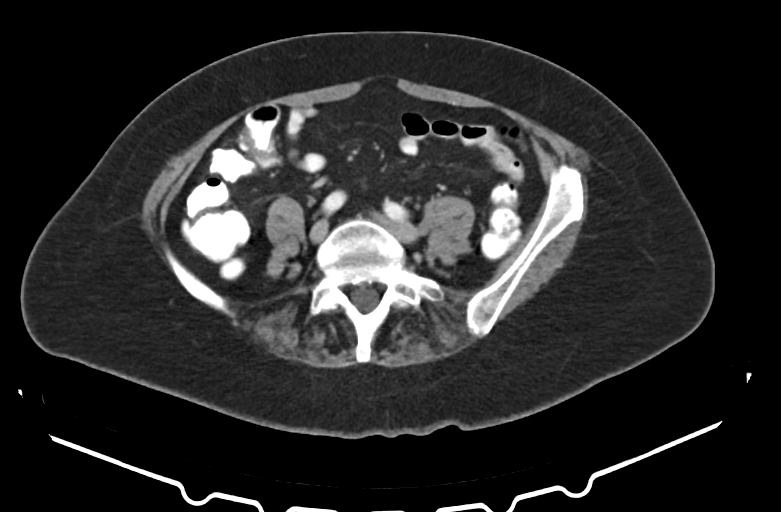
[im 52/95  soft-tissue]
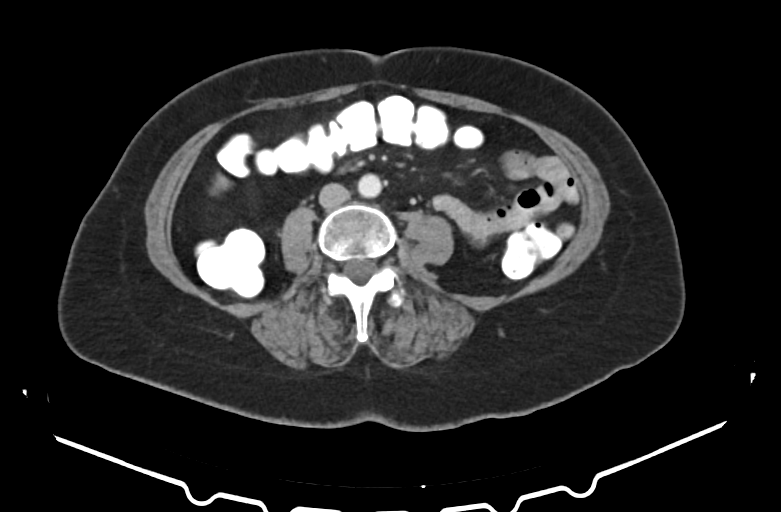
[im 65/95  soft-tissue]
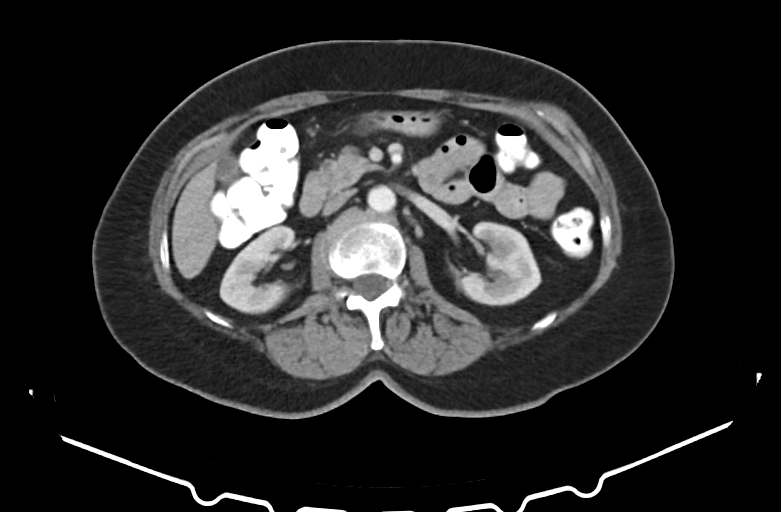
[im 77/95  soft-tissue]
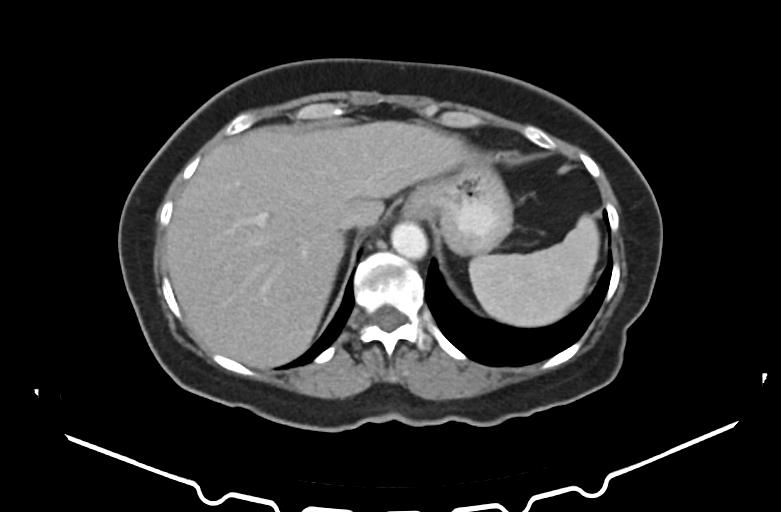
[im 86/95  soft-tissue]
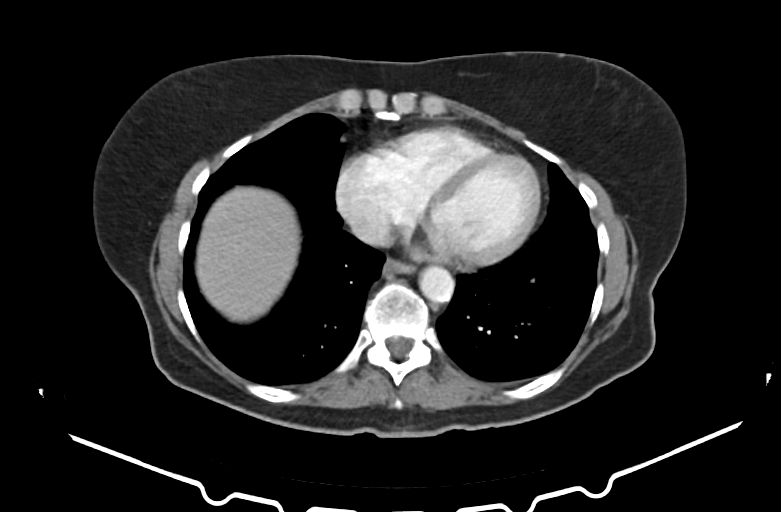

[Series 8: coronal · coronal · 0.90mm/px · 3 of 46 slices shown]
[im 16/46  soft-tissue]
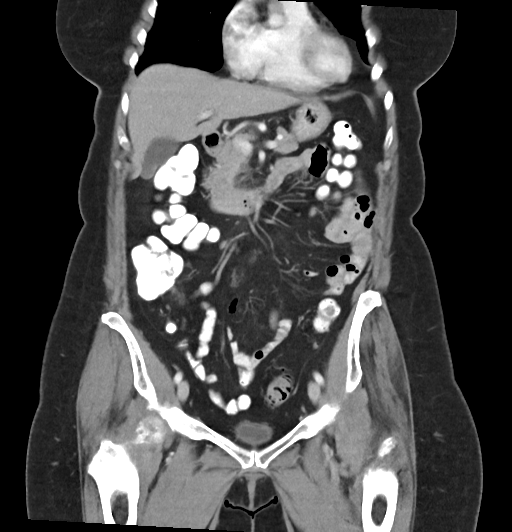
[im 21/46  soft-tissue]
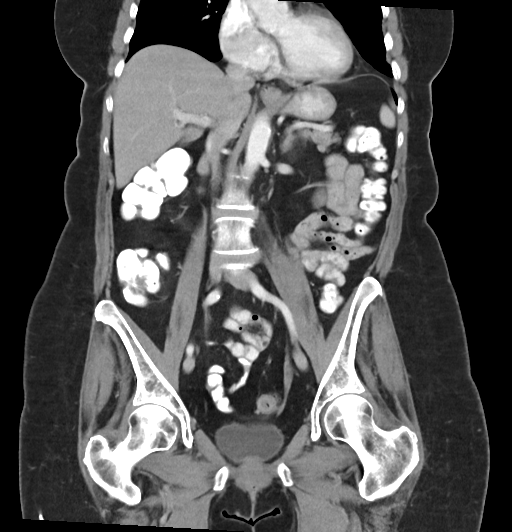
[im 26/46  soft-tissue]
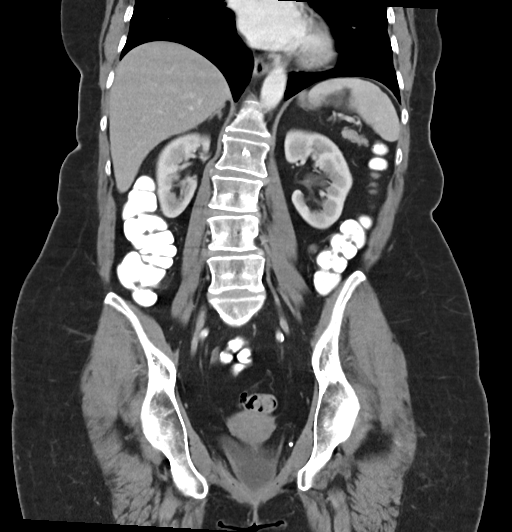

[11 of 46 positions shown; findings below may reference images not displayed]

FINDINGS: LUNG BASES AND HEART: Mild atelectasis within the lingula. The visualized portions of the lung bases and heart are unremarkable
LIVER: Unremarkable.
GALLBLADDER AND COMMON BILE DUCT: Unremarkable.
SPLEEN: Unremarkable.
KIDNEYS AND URETERS: Unremarkable.
ADRENAL GLANDS: Unremarkable.
PANCREAS: Unremarkable
STOMACH: Tiny hiatal hernia. The stomach is otherwise unremarkable.
SMALL BOWEL: Normal in caliber. No evidence of obstruction.
COLON AND APPENDIX: The appendix is normal. Diverticulosis of the sigmoid colon, without evidence of diverticulitis.
PELVIS: Urinary bladder is unremarkable. No abnormal adnexal masses. No free pelvic fluid. Small cystocele.
MAJOR VASCULATURE: The main portal vein is patent. The abdominal aorta is normal in caliber with scattered calcific atherosclerosis.
LYMPH NODES: No pathologically enlarged lymph nodes.
OSSEOUS STRUCTURES: No acute fracture or aggressive osseous lesion. Moderate osteoarthritis of bilateral hips.
ABDOMINAL WALL SOFT TISSUES: Heterogeneous fatty atrophy of the left gluteus minimus muscle. Tiny fat-containing umbilical hernia.
IMPRESSION: 1.  No CT evidence of an acute abnormality of the abdomen or pelvis. Normal appendix.
2.  Diverticulosis of the sigmoid colon, without evidence of diverticulitis.
3.  Small cystocele.

## 2021-07-26 IMAGING — US US GALLBLADDER
1 series · 14 of 16 positions shown · non-contrast
Comparison: None

Images Obtained from [facility]
HISTORY: Right upper quadrant pain.
TECHNIQUE: Gallbladder ultrasound.

[Series 1: us gallbladder · 14 of 16 slices shown]
[im 1/16]
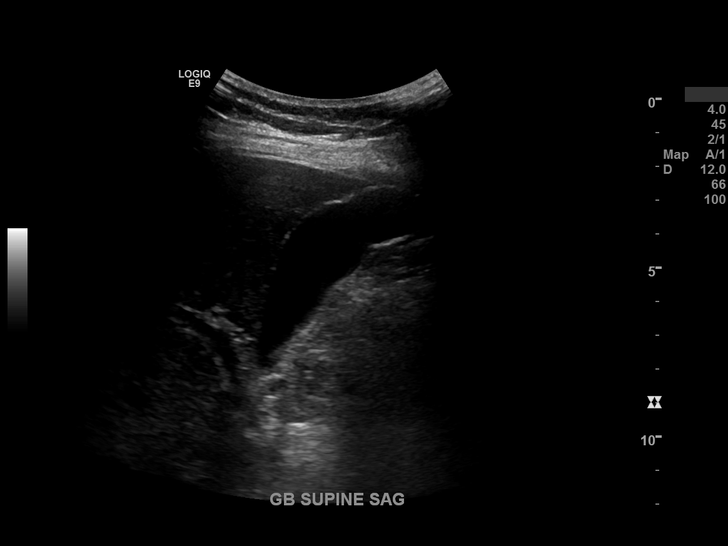
[im 2/16]
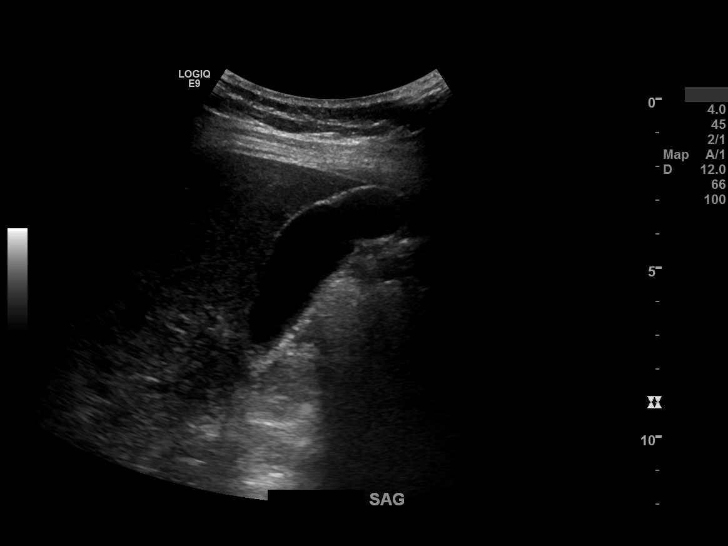
[im 3/16]
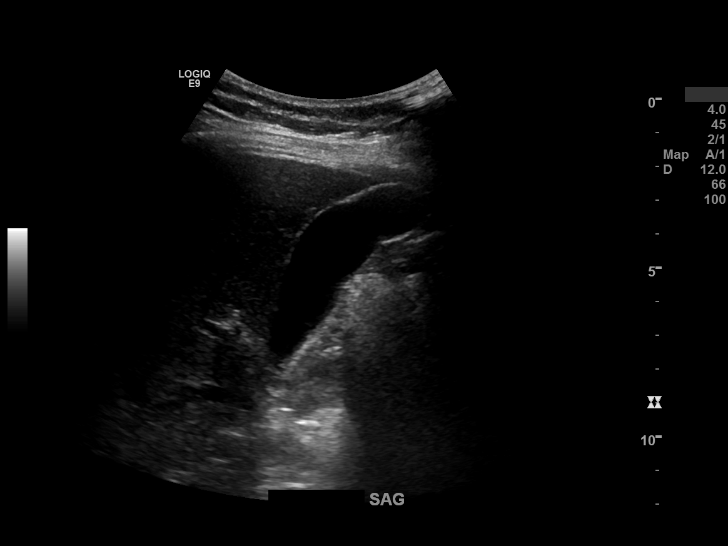
[im 5/16]
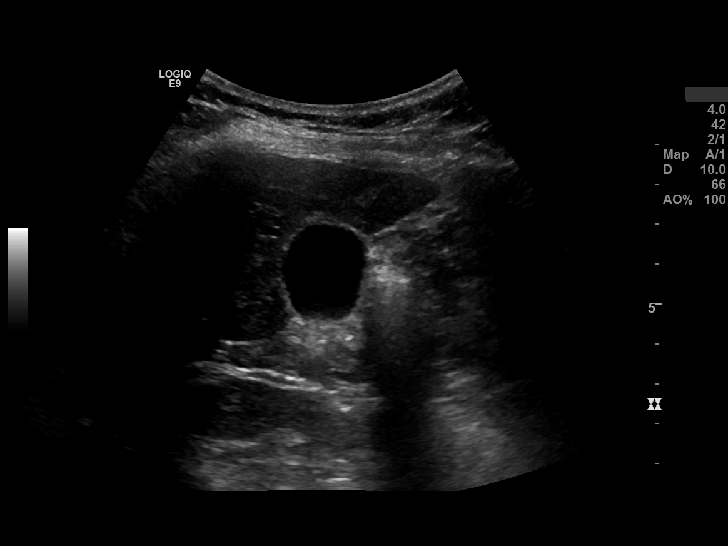
[im 6/16]
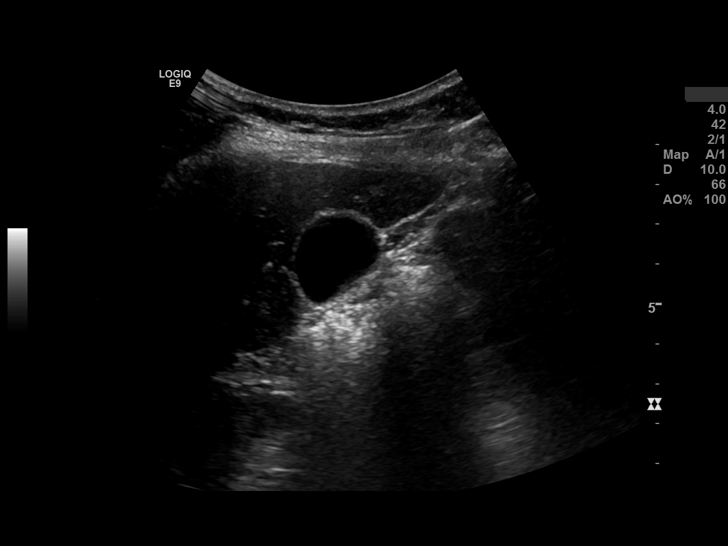
[im 7/16]
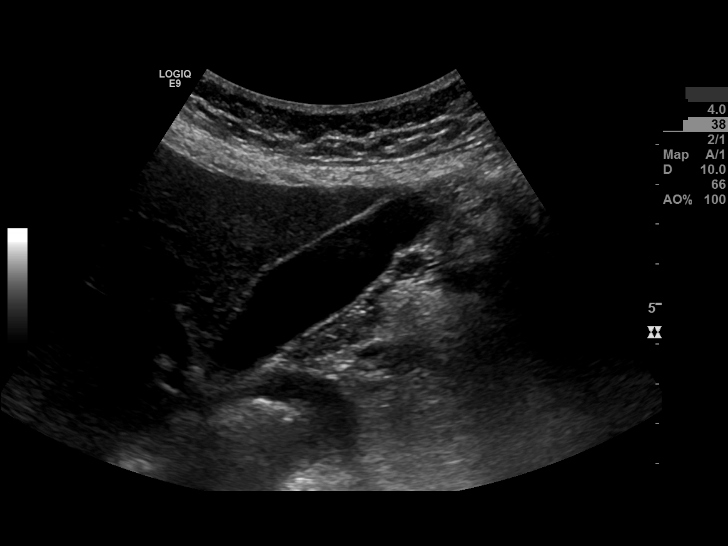
[im 8/16]
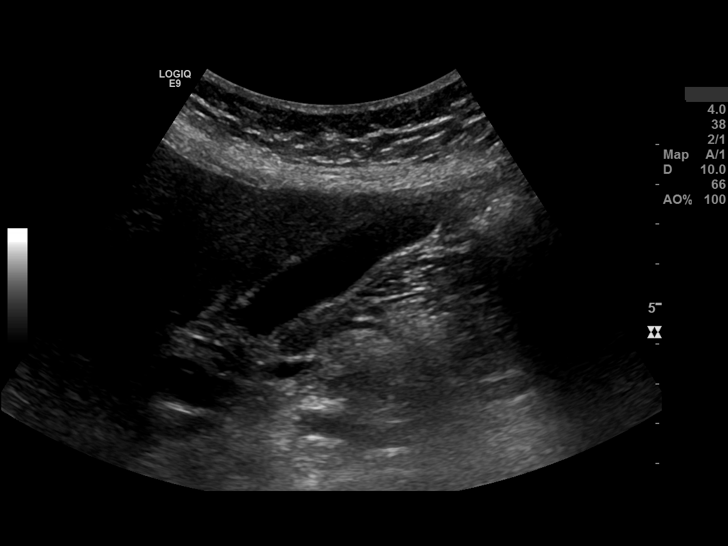
[im 9/16]
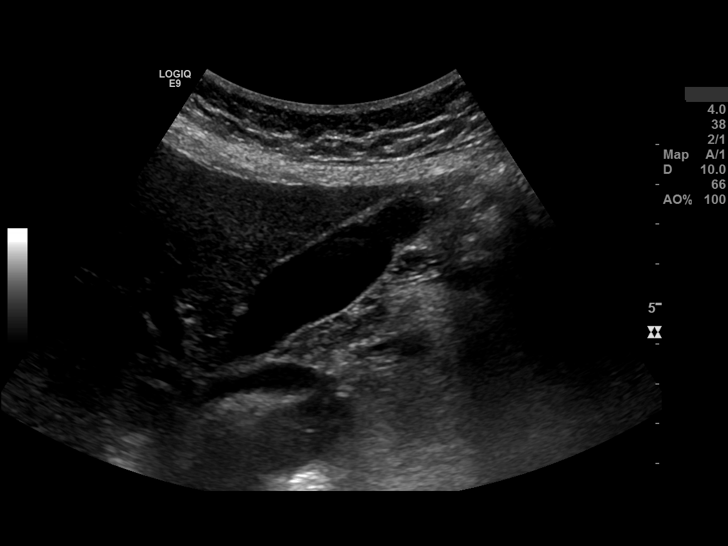
[im 10/16]
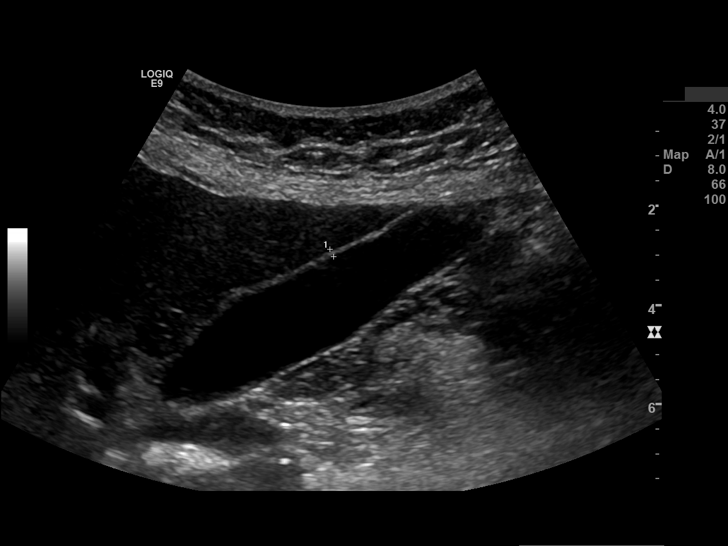
[im 11/16]
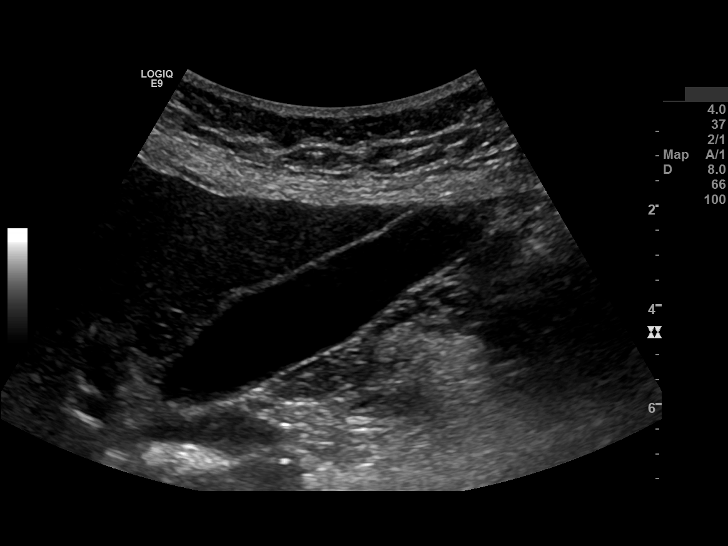
[im 13/16]
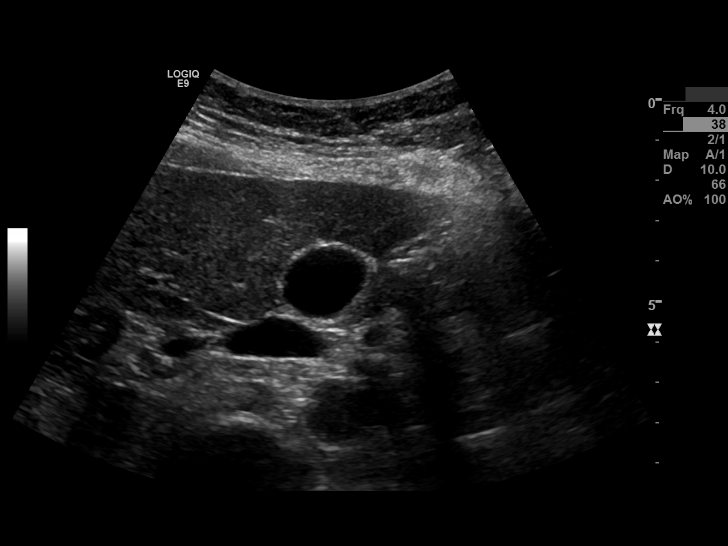
[im 14/16]
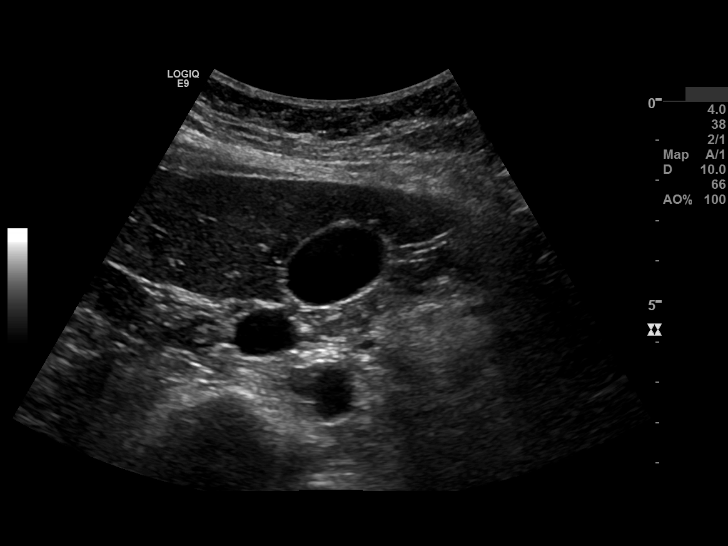
[im 15/16]
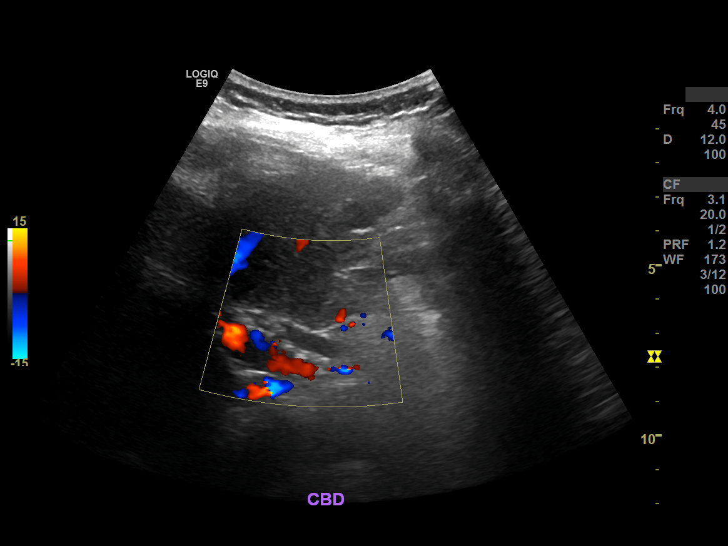
[im 16/16]
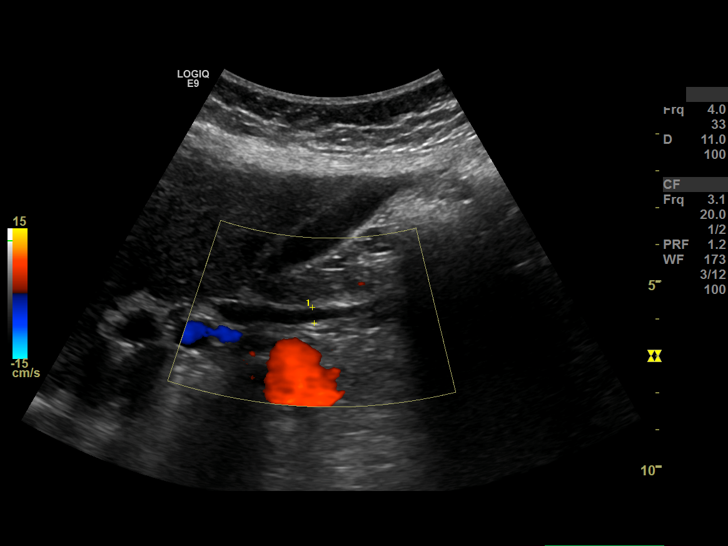

[14 of 16 positions shown; findings below may reference images not displayed]

FINDINGS: Multiple images from real-time examination of the gallbladder were performed.  The gallbladder is normal with no stones, sludge, wall thickening or pericholecystic fluid. Common bile duct
is normal in size, measuring 4.3 mm. Sonographic Murphy sign is negative.
IMPRESSION: Normal gallbladder ultrasound.

## 2021-09-08 ENCOUNTER — Ambulatory Visit: Admit: 2021-09-08 | Discharge: 2021-09-08 | Payer: MEDICARE | Attending: Registered Nurse | Primary: Family Medicine

## 2021-09-08 DIAGNOSIS — N390 Urinary tract infection, site not specified: Secondary | ICD-10-CM

## 2021-09-08 LAB — POCT URINALYSIS DIPSTICK W/O MICROSCOPE (AUTO)
Bilirubin, UA: NEGATIVE
Glucose, UA POC: NEGATIVE
Ketones, UA: NEGATIVE
Nitrite, UA: NEGATIVE
Protein, UA POC: >=300
Spec Grav, UA: >=1.03
Urobilinogen, UA: 0.2
pH, UA: 5

## 2021-09-08 NOTE — Progress Notes (Signed)
[facility] [place] [facility] [place] [facility]  [address] [name]  [place] [address]  Dept: [phone]  Loc: [phone]    Visit Date: 09/08/2021        HPI:     Michelle Bright is a 71 y.o. female who presents today for:  Chief Complaint   Patient presents with    Urinary Tract Infection     Recurrent        HPI    Pt seen in follow up for recurrent UTI.      Pt has a hx of recurrent UTIs previously treated with D mannose.  Pt previously tried oxybutynin, trospium, and Myrbetriq for her OAB without improvement.  Underwent interstim stage I by Dr. Welton Flakes 02/11/20 and stage II placement 02/25/20.        Had episode of severe UTI in October with urinary retention and required foley insertion for 6 days.  Subsequent PVR check was acceptable after removal.       Reports she was seen in outside facility ER 2/3 for dysuria, suprapubic pain, and diagnosed with UTI.  ER physician reported to her and husband that she had a prolapsed bladder.  Referred to gynecology and fitted with a pessary.  Reports she is tolerating the pessary well and urinary retention is resolved.  No further infections.       Doing well today.  Denies fever/chills, abdominal or new back pain, dysuria, frequency, urgency, trouble emptying.       Accompanied to appt today by her husband [name].        Current Outpatient Medications   Medication Sig Dispense Refill    lisinopril (PRINIVIL;ZESTRIL) 20 MG tablet Take 1 tablet by mouth daily      Multiple Vitamins-Minerals (THERAPEUTIC MULTIVITAMIN-MINERALS) tablet Take 1 tablet by mouth daily      metOLazone (ZAROXOLYN) 2.5 MG tablet       Cholecalciferol (VITAMIN D3) 50 MCG (2000 UT) CAPS Take by mouth      D-Mannose 500 MG CAPS Take by mouth 2 times daily       Ascorbic Acid (VITAMIN C) 500 MG CHEW Take 500 mg by mouth daily      metoprolol succinate (TOPROL XL) 50 MG extended release tablet Take 1 tablet by mouth daily      furosemide (LASIX) 20 MG tablet 1 tablet Patient  taking 1/2 tablet to 1 tablet daily      potassium chloride (KLOR-CON) 10 MEQ extended release tablet       nitroGLYCERIN (NITROSTAT) 0.4 MG SL tablet up to max of 3 total doses. If no relief after 1 dose, call 911. 25 tablet 3    acetaminophen (TYLENOL) 500 MG tablet Take 1 tablet by mouth every 6 hours as needed for Pain      warfarin (COUMADIN) 3 MG tablet Take 1 tablet by mouth daily      levothyroxine (SYNTHROID) 100 MCG tablet Take 125 mcg by mouth Daily      IRON PO Take 65 mg by mouth 2 times daily       No current facility-administered medications for this visit.       Past Medical History  [name]  has a past medical history of Arthritis, Atrial fibrillation (HCC), CAD (coronary artery disease), Cerebral artery occlusion with cerebral infarction (HCC), Hypertension, Osteoporosis, Prolapse of female bladder, acquired, and Thyroid disease.    Past Surgical History  The patient  has a past surgical history that  includes Femur Surgery (12/2014); Thyroid surgery (2005); fracture surgery; Stimulator Surgery (N/A, 02/11/2020); Stimulator Surgery (N/A, 02/25/2020); and Bladder surgery (03/11/2021).    Family History  This patient's family history is not on file.    Social History  Michelle Bright  reports that she quit smoking about 29 years ago. Her smoking use included cigarettes. She has never used smokeless tobacco. She reports that she does not currently use alcohol. She reports that she does not use drugs.      Subjective:      Review of Systems   Constitutional:  Negative for activity change, appetite change, chills, diaphoresis, fatigue, fever and unexpected weight change.   Gastrointestinal:  Negative for abdominal pain, nausea and vomiting.   Genitourinary:  Negative for decreased urine volume, difficulty urinating, dysuria, flank pain, frequency, hematuria and urgency.   Musculoskeletal:  Negative for back pain.     Objective:   BP (!) 158/82   Ht 4\' 11"  (1.499 m)   Wt 99 lb (44.9 kg)   BMI 20.00 kg/m      Physical Exam  Vitals reviewed.   Constitutional:       General: She is not in acute distress.     Appearance: Normal appearance. She is well-developed. She is not ill-appearing or diaphoretic.   HENT:      Head: Normocephalic and atraumatic.      Right Ear: External ear normal.      Left Ear: External ear normal.      Nose: Nose normal.      Mouth/Throat:      Mouth: Mucous membranes are moist.   Eyes:      General: No scleral icterus.        Right eye: No discharge.         Left eye: No discharge.   Neck:      Vascular: No JVD.      Trachea: No tracheal deviation.   Cardiovascular:      Rate and Rhythm: Normal rate and regular rhythm.   Pulmonary:      Effort: Pulmonary effort is normal. No respiratory distress.   Abdominal:      General: There is no distension.      Tenderness: There is no abdominal tenderness. There is no right CVA tenderness or left CVA tenderness.   Musculoskeletal:         General: No tenderness. Normal range of motion.   Skin:     General: Skin is warm and dry.   Neurological:      Mental Status: She is alert and oriented to person, place, and time. Mental status is at baseline.   Psychiatric:         Mood and Affect: Mood normal.         Behavior: Behavior normal.         Thought Content: Thought content normal.       POC  Results for POC orders placed in visit on 09/08/21   POCT Urinalysis No Micro (Auto)   Result Value Ref Range    Color, UA yellow     Clarity, UA clear     Glucose, UA POC neg     Bilirubin, UA neg     Ketones, UA neg     Spec Grav, UA >=1.030     Blood, UA POC moderate     pH, UA 5.0     Protein, UA POC >=300 mg/dl     Urobilinogen, UA 0.2 E.U/dl  Leukocytes, UA smalle     Nitrite, UA neg          Patients recent PSA values are as follows  No results found for: PSA, PSADIA     Recent BUN/Creatinine:  Lab Results   Component Value Date/Time    BUN 19 01/28/2020 08:43 AM    CREATININE 0.7 01/28/2020 08:43 AM       Assessment:   Urinary retention, resolved  OAB  Hx  recurrent UTIs  POP s/p pessary  Plan:     Kelcie is doing well with interstim device.  No further infections since pessary placed and urinary retention improved.  Very happy with control of symptoms at this time.      Continue D mannose. If future trouble with infections consider Hiprex.    F/u in 9 months with PVR.  Call for worsening in symptoms prior.

## 2021-09-09 LAB — URINALYSIS WITH MICROSCOPIC
Bilirubin Urine: NEGATIVE
Casts: NONE SEEN /lpf
Crystals: NONE SEEN
Glucose, Urine: NEGATIVE mg/dl
Ketones, Urine: NEGATIVE
Miscellaneous Lab Test Result: NONE SEEN
Nitrite, Urine: NEGATIVE
Protein, UA: 300 mg/dl — AB
Renal Epithelial, UA: NONE SEEN
Specific Gravity, UA: 1.025 (ref 1.002–1.030)
Urobilinogen, Urine: 0.2 eu/dl (ref 0.0–1.0)
pH, UA: 5 (ref 5.0–9.0)

## 2021-09-10 LAB — CULTURE, URINE

## 2021-11-30 NOTE — Patient Instructions (Signed)
You may receive a survey regarding the care you received during your visit.  Your input is valuable to us.  We encourage you to complete and return your survey.  We hope you will choose us in the future for your healthcare needs.    Thank you for choosing [facility]!    Your Medical Assistant Today:  Reginaldo Hazard B      Your RN Today:  Jess B  Your Provider for Today: Dr. Hakim  Ph. [phone] opt 1

## 2021-12-01 ENCOUNTER — Inpatient Hospital Stay: Admit: 2021-12-01 | Payer: MEDICARE | Primary: Family Medicine

## 2021-12-01 ENCOUNTER — Inpatient Hospital Stay: Payer: MEDICARE | Primary: Family Medicine

## 2021-12-01 ENCOUNTER — Ambulatory Visit: Admit: 2021-12-01 | Discharge: 2021-12-01 | Payer: MEDICARE | Attending: Internal Medicine | Primary: Family Medicine

## 2021-12-01 DIAGNOSIS — I4891 Unspecified atrial fibrillation: Secondary | ICD-10-CM

## 2021-12-01 NOTE — Progress Notes (Signed)
[facility] ([facility]  [address]  [place] [address]  Dept: [phone]  Cardiac Electrophysiology: Consultation Note  Patient's demographics:  Date:   12/01/2021  Patient name:              Michelle Bright  Date of Birth:  1950-09-06  Sex:    female   MRN:   [id number]    Primary Care Physician:  Elton Sin, MD    Cardiologist:  Nelida Gores, MD    Reason for Consultation:  Atrial fibrillation, atrial flutter and cardiomyopathy.     Clinical Summary:  Michelle Bright is a 71 years old lady was diagnosed to have atrial fibrillation in 2016 during hospitalization for hip fracture.  She spontaneously converted to sinus rhythm.  She was started on flecainide, metoprolol and warfarin.  Continued to have intermittent episodes of palpitations with spontaneous resolution.  Never previously cardioverted.  Around 2 years ago she was told her heart functions are low.  Flecainide was discontinued she was admitted to the hospital in October 2023 with shortness of breath and palpitation.  Noted to be in rapid atrial fibrillation.  Dose of metoprolol was increased.  Echocardiogram showed severe left ventricle systolic dysfunction, ejection fraction 25 to 30%.  She had a myocardial perfusion scan, reportedly normal.  She was referred here for evaluation of ASD implantation and management of atrial arrhythmias.    Reports palpitation on and off 1-2 times a week lasting for minutes to hours.  Gets short of breath with physical activity, half a block to a block.  Has bilateral lower extremity edema, stable.  No syncope, PND or orthopnea.  Denies smoking or alcohol use. Medical history: Paroxysmal atrial fibrillation d 2016 (failed Flecainide), atrial flutter dx 10/2021, HFrEF dx 2021 (LVEF 25 to 30%), HTN, HPL, hx of CVA, and thyroidectomy (11/2003)    Review of systems:  Constitutional: Negative for chills and fever  HENT: Negative for congestion, sinus pressure, sneezing and sore  throat.    Eyes: Negative for pain, discharge, redness and itching.   Respiratory: Negative for apnea, cough  Gastrointestinal: Negative for blood in stool, constipation, diarrhea   Endocrine: Negative for cold intolerance, heat intolerance, polydipsia.  Genitourinary: Negative for dysuria, enuresis, flank pain and hematuria.   Musculoskeletal: Negative for arthralgias, joint swelling and neck pain.   Neurological: Negative for numbness and headaches.   Psychiatric/Behavioral: Negative for agitation, confusion, decreased concentration and dysphoric mood.      Past Medical History::  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 bladder, acquired     Thyroid disease     Removed        Past Surgical History:  Past Surgical History:   Procedure Laterality Date    BLADDER SURGERY  03/11/2021    pessary    FEMUR SURGERY  12/2014    left femur    FRACTURE SURGERY      STIMULATOR SURGERY N/A 02/11/2020    STAGE 1 INTERSTIM performed by Helane Rima, MD at [facility] OR    STIMULATOR SURGERY N/A 02/25/2020    STAGE 2 STIMULATOR INSERTION performed by  Kennith Maes, MD at [facility]  2005       Family History:  No family history on file.     Social History:  Social History     Socioeconomic History    Marital status: Married     Spouse name: None    Number of children: None    Years of education: None    Highest education level: None   Tobacco Use    Smoking status: Former     Types: Cigarettes     Quit date: 03/09/1992     Years since quitting: 29.7    Smokeless tobacco: Never   Vaping Use    Vaping Use: Never used   Substance and Sexual Activity    Alcohol use: Not Currently    Drug use: Never        Allergies:  No Known Allergies     Medications:  Current Outpatient Medications   Medication Sig Dispense  Refill    lisinopril (PRINIVIL;ZESTRIL) 20 MG tablet Take 1 tablet by mouth daily      Multiple Vitamins-Minerals (THERAPEUTIC MULTIVITAMIN-MINERALS) tablet Take 1 tablet by mouth daily      metOLazone (ZAROXOLYN) 2.5 MG tablet 1 tablet Twice a week      Cholecalciferol (VITAMIN D3) 50 MCG (2000 UT) CAPS Take by mouth      D-Mannose 500 MG CAPS Take by mouth 2 times daily       Ascorbic Acid (VITAMIN C) 500 MG CHEW Take 500 mg by mouth daily      metoprolol succinate (TOPROL XL) 50 MG extended release tablet Take 1 tablet by mouth daily      furosemide (LASIX) 20 MG tablet 1 tablet Patient taking 1/2 tablet to 1 tablet daily      potassium chloride (KLOR-CON) 10 MEQ extended release tablet       acetaminophen (TYLENOL) 500 MG tablet Take 1 tablet by mouth every 6 hours as needed for Pain      warfarin (COUMADIN) 3 MG tablet Take 1 tablet by mouth daily      levothyroxine (SYNTHROID) 100 MCG tablet Take 125 mcg by mouth Daily      nitroGLYCERIN (NITROSTAT) 0.4 MG SL tablet up to max of 3 total doses. If no relief after 1 dose, call 911. 25 tablet 3    IRON PO Take 65 mg by mouth 2 times daily (Patient not taking: Reported on 12/01/2021)       No current facility-administered medications for this visit.       Physical Examination:  Vitals:    12/01/21 0835   BP: 129/61   Pulse: 83   SpO2: 96%      Body mass index is 20.97 kg/m.     GENERAL: Alert and oriented. No distress.  EYES: No pallor or icterus.  ENT: No cyanosis.No thyromegaly or cervical LAP.  VESSELS: No jugular venous distension or carotid bruits.  HEART: Normal S1/S2. No murmur, rub or gallop.  LUNGS: Clear to auscultation.  ABDOMEN: Soft and non-tender.   EXTREMITIES: 2+ bilateral lower extremity edema.    NEUROLOGICAL: Grossly normal.     Laboratory And Diagnostic Data  I have personally reviewed and interpreted the results of the following diagnostic testing    Lab Results   Component Value Date    WBC 11.6 (H) 01/28/2020    HGB 15.4 01/28/2020    HCT  49.6 (H) 01/28/2020    PLT 177 01/28/2020  CHOL 156 04/11/2015    TRIG 107 04/11/2015    HDL 64 04/11/2015    ALT 12 04/11/2015    AST 17 04/11/2015    NA 146 (H) 01/28/2020    K 4.4 02/25/2020    CL 103 01/28/2020    CREATININE 0.7 01/28/2020    BUN 19 01/28/2020    CO2 28 01/28/2020    TSH 8.780 (H) 04/09/2015    INR 1.12 02/25/2020       Echocardiogram 11/09/2021: LVEF 25 to 30%, LVWT 1.1 cm, LAD/LAVI 4.5 cm.  Mild to moderate, and mild to moderate TR.    ECG 12/01/2021: Atrial flutter with controlled ventricular rate and left intrafascicular block.  Lexiscan myocardial perfusion imaging  10/2021: Negative for ischemia.     Impression:  Typical atrial flutter with controlled ventricular rate.  Paroxysmal atrial fibrillation failed Flecainide.  CHA2DS2-VASc score 6.  On warfarin, INR 2.4.  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  Bilateral lower extremity swelling.  HTN and hx of CVA.   ? left pleural effusion.     Assessment And Recommendations:  The patient is currently rate controlled for atrial flutter.  Not sure about the nature of her cardiomyopathy.  Could be related to the atrial arrhythmias.  However, we will proceed with left heart catheterization to exclude significant coronary artery disease.  She will benefit with rhythm control, will decide about this after her left heart cath.  Chest x-ray just to rule out pleural effusion.  Has not tolerated Entresto in the past.  Continue lisinopril, metoprolol succinate and diuretics.  Will need optimization of her volume status.  Will benefit with referral to heart failure clinic.    Thank you for allowing me to participate in the care of your patient. Please call me if you have any questions.      **This report has been created using voice recognition software. It may contain minor errors which are inherent in voice recognition technology.**       Electronically signed by Carlos Levering, MD, MRCP, [facility], [facility] on 12/01/2021 at  9:06 AM

## 2021-12-01 NOTE — Telephone Encounter (Signed)
PROCEDURE: cardiac cath prior auth     DATE OF SERVICE: 12/01/2021    SERVICE LOCATION: [facility]     CPT CODE: 16109    PHYSICIAN: DR. Charlyn Minerva     DATE PRIOR AUTH SUBMITTED: 11//08/2021    STATUS: APPROVED.     AUTH NUMBER: [id number]    VALID: 12/01/2021-02/28/2022

## 2021-12-21 NOTE — Patient Instructions (Signed)
Notified of Heart Cath procedure arrival time 1200 on Wednesday  Register on 2nd floor of [facility]  NPO after midnight  Bring drivers license and insurance information  Wear comfortable clean clothes  Shower morning of and night before with liquid antibacterial soap  Remove jewelry   May have to stay overnight if have PTCA/stent - bring small overnight bag  Bring medications in original bottles - may take am meds with small amount of water.    Do not take any diabetic meds day of procedure.  Made aware of visitors limit to 2 at a time  Follow all instructions given by your physician  Please notify doctor office if you need to cancel or reschedule your procedure  Driver needed at discharge  If you use CPap or BiPap for sleep apnea, please bring machine with you  Leave valuables at home

## 2021-12-22 ENCOUNTER — Inpatient Hospital Stay: Payer: Medicare (Managed Care) | Attending: Cardiovascular Disease

## 2021-12-22 LAB — BASIC METABOLIC PANEL
BUN: 19 mg/dL (ref 7–22)
CO2: 29 meq/L (ref 23–33)
Calcium: 8.7 mg/dL (ref 8.5–10.5)
Chloride: 99 meq/L (ref 98–111)
Creatinine: 0.7 mg/dL (ref 0.4–1.2)
Glucose: 101 mg/dL (ref 70–108)
Potassium: 3.8 meq/L (ref 3.5–5.2)
Sodium: 140 meq/L (ref 135–145)

## 2021-12-22 LAB — APTT: aPTT: 38.4 seconds — ABNORMAL HIGH (ref 22.0–38.0)

## 2021-12-22 LAB — CBC
Hematocrit: 50.9 % — ABNORMAL HIGH (ref 37.0–47.0)
Hemoglobin: 15.7 gm/dl (ref 12.0–16.0)
MCH: 31.5 pg (ref 26.0–33.0)
MCHC: 30.8 gm/dl — ABNORMAL LOW (ref 32.2–35.5)
MCV: 102.2 fL — ABNORMAL HIGH (ref 81.0–99.0)
MPV: 10.8 fL (ref 9.4–12.4)
Platelets: 157 10*3/uL (ref 130–400)
RBC: 4.98 10*6/uL (ref 4.20–5.40)
RDW-CV: 14.2 % (ref 11.5–14.5)
RDW-SD: 53.7 fL — ABNORMAL HIGH (ref 35.0–45.0)
WBC: 8.4 10*3/uL (ref 4.8–10.8)

## 2021-12-22 LAB — TYPE AND SCREEN
Antibody Screen: POSITIVE
Rh Factor: POSITIVE

## 2021-12-22 LAB — GLOMERULAR FILTRATION RATE, ESTIMATED: Est, Glom Filt Rate: >60 mL/min/{1.73_m2} (ref >60)

## 2021-12-22 LAB — GEL ANTIBODY IDENTIFICATION: Antibody ID: POSITIVE

## 2021-12-22 LAB — K ANTIGEN TYPING: K Antigen: NEGATIVE

## 2021-12-22 LAB — CARDIAC PROCEDURE: Body Surface Area: 1.39 m2

## 2021-12-22 LAB — PROTIME-INR: INR: 1.66 — ABNORMAL HIGH (ref 0.85–1.13)

## 2021-12-22 LAB — ANION GAP: Anion Gap: 12 meq/L (ref 8.0–16.0)

## 2021-12-22 MED ORDER — NITROGLYCERIN 0.4 MG SL SUBL
0.4 MG | SUBLINGUAL | Status: DC | PRN
Start: 2021-12-22 — End: 2021-12-22

## 2021-12-22 MED ORDER — MIDAZOLAM HCL 2 MG/2ML IJ SOLN
2 MG/ML | INTRAMUSCULAR | Status: AC
Start: 2021-12-22 — End: ?

## 2021-12-22 MED ORDER — NORMAL SALINE FLUSH 0.9 % IV SOLN
0.9 % | Freq: Two times a day (BID) | INTRAVENOUS | Status: DC
Start: 2021-12-22 — End: 2021-12-22

## 2021-12-22 MED ORDER — METOPROLOL TARTRATE 5 MG/5ML IV SOLN
5 MG/ML | INTRAVENOUS | Status: AC
Start: 2021-12-22 — End: ?

## 2021-12-22 MED ORDER — NORMAL SALINE FLUSH 0.9 % IV SOLN
0.9 % | INTRAVENOUS | Status: DC | PRN
Start: 2021-12-22 — End: 2021-12-22

## 2021-12-22 MED ORDER — ACETAMINOPHEN 325 MG PO TABS
325 MG | ORAL | Status: DC | PRN
Start: 2021-12-22 — End: 2021-12-22

## 2021-12-22 MED ORDER — SODIUM CHLORIDE 0.9 % IV SOLN
0.9 % | INTRAVENOUS | Status: DC | PRN
Start: 2021-12-22 — End: 2021-12-22

## 2021-12-22 MED ORDER — SODIUM CHLORIDE 0.9 % IV SOLN
0.9 % | INTRAVENOUS | Status: DC
Start: 2021-12-22 — End: 2021-12-22
  Administered 2021-12-22: 19:00:00 via INTRAVENOUS

## 2021-12-22 MED ORDER — IOPAMIDOL 76 % IV SOLN
76 % | INTRAVENOUS | Status: DC | PRN
Start: 2021-12-22 — End: 2021-12-22
  Administered 2021-12-22: 19:00:00 40 via INTRACARDIAC

## 2021-12-22 MED ORDER — VERAPAMIL HCL 2.5 MG/ML IV SOLN
2.5 MG/ML | INTRAVENOUS | Status: AC
Start: 2021-12-22 — End: ?

## 2021-12-22 MED ORDER — METOPROLOL TARTRATE 5 MG/5ML IV SOLN
5 MG/ML | INTRAVENOUS | Status: DC | PRN
Start: 2021-12-22 — End: 2021-12-22
  Administered 2021-12-22: 19:00:00 2.5 via INTRAVENOUS

## 2021-12-22 MED ORDER — NITROGLYCERIN 500 MCG / 10 ML VIAL
Status: AC
Start: 2021-12-22 — End: ?

## 2021-12-22 MED ORDER — ATROPINE SULFATE 0.4 MG/ML IV SOLN
0.4 MG/ML | Freq: Once | INTRAVENOUS | Status: DC | PRN
Start: 2021-12-22 — End: 2021-12-22

## 2021-12-22 MED ORDER — HEPARIN SODIUM (PORCINE) 1000 UNIT/ML IJ SOLN
1000 UNIT/ML | INTRAMUSCULAR | Status: DC | PRN
Start: 2021-12-22 — End: 2021-12-22
  Administered 2021-12-22: 19:00:00 via INTRA_ARTERIAL

## 2021-12-22 MED ORDER — HEPARIN SODIUM (PORCINE) 1000 UNIT/ML IJ SOLN
1000 UNIT/ML | INTRAMUSCULAR | Status: AC
Start: 2021-12-22 — End: ?

## 2021-12-22 MED ORDER — FENTANYL CITRATE (PF) 100 MCG/2ML IJ SOLN
100 MCG/2ML | INTRAMUSCULAR | Status: AC
Start: 2021-12-22 — End: ?

## 2021-12-22 MED ORDER — LIDOCAINE HCL (PF) 2 % IJ SOLN
2 % | INTRAMUSCULAR | Status: DC | PRN
Start: 2021-12-22 — End: 2021-12-22
  Administered 2021-12-22: 19:00:00 1 via SUBCUTANEOUS

## 2021-12-22 MED ORDER — LIDOCAINE HCL 2 % IJ SOLN
2 % | INTRAMUSCULAR | Status: AC
Start: 2021-12-22 — End: ?

## 2021-12-22 MED ORDER — FENTANYL CITRATE (PF) 100 MCG/2ML IJ SOLN
100 MCG/2ML | INTRAMUSCULAR | Status: DC | PRN
Start: 2021-12-22 — End: 2021-12-22
  Administered 2021-12-22: 19:00:00 50 via INTRAVENOUS

## 2021-12-22 MED ORDER — SODIUM CHLORIDE 0.9 % IV SOLN
0.9 % | INTRAVENOUS | Status: DC
Start: 2021-12-22 — End: 2021-12-22

## 2021-12-22 MED ORDER — MIDAZOLAM HCL 2 MG/2ML IJ SOLN
2 MG/ML | INTRAMUSCULAR | Status: DC | PRN
Start: 2021-12-22 — End: 2021-12-22
  Administered 2021-12-22: 19:00:00 1 via INTRAVENOUS

## 2021-12-22 MED ORDER — ASPIRIN 325 MG PO TABS
325 MG | Freq: Once | ORAL | Status: AC
Start: 2021-12-22 — End: 2021-12-22
  Administered 2021-12-22: 18:00:00 325 mg via ORAL

## 2021-12-22 MED FILL — FENTANYL CITRATE (PF) 100 MCG/2ML IJ SOLN: 100 MCG/2ML | INTRAMUSCULAR | Qty: 2

## 2021-12-22 MED FILL — HEPARIN SODIUM (PORCINE) 1000 UNIT/ML IJ SOLN: 1000 UNIT/ML | INTRAMUSCULAR | Qty: 20

## 2021-12-22 MED FILL — ASPIRIN 325 MG PO TABS: 325 MG | ORAL | Qty: 1

## 2021-12-22 MED FILL — XYLOCAINE 2 % IJ SOLN: 2 % | INTRAMUSCULAR | Qty: 20

## 2021-12-22 MED FILL — METOPROLOL TARTRATE 5 MG/5ML IV SOLN: 5 MG/ML | INTRAVENOUS | Qty: 5

## 2021-12-22 MED FILL — VERAPAMIL HCL 2.5 MG/ML IV SOLN: 2.5 MG/ML | INTRAVENOUS | Qty: 2

## 2021-12-22 MED FILL — NITROGLYCERIN 500 MCG / 10 ML VIAL: Qty: 1

## 2021-12-22 MED FILL — MIDAZOLAM HCL 2 MG/2ML IJ SOLN: 2 MG/ML | INTRAMUSCULAR | Qty: 2

## 2021-12-22 NOTE — H&P (Signed)
[facility]  Sedation/Analgesia History & Physical    Pt Name: Michelle Bright  Account number: [id number]  MRN: [id number]  Date of Birth: 1950/05/12  Provider Performing Procedure: Veverly Fells, MD MD [facility]  Primary Care Physician: Michaela Corner, MD  Date: 12/22/2021    PRE-PROCEDURE    Code Status: FULL CODE  Brief History/Pre-Procedure Diagnosis:   CMP     Consent: : I have discussed with the patient risks, benefits, and alternatives (and relevant risks, benefits, and side effects related to alternatives or not receiving care), and likelihood of the success.   The patient and/or representative appear to understand and agree to proceed.  The discussion encompasses risks, benefits, and side effects related to the alternatives and the risks related to not receiving the proposed care, treatment, and services.         MEDICAL HISTORY  [] ASHD/ANGINA/MI/CHF   [x] Hypertension  [] Diabetes  [] Hyperlipidemia  [] Smoking  [] Family Hx of ASHD  [] Additional information:       has a past medical history of Arthritis, Atrial fibrillation ([place]), CAD (coronary artery disease), Cerebral artery occlusion with cerebral infarction ([place]), Hypertension, Osteoporosis, Prolapse of female bladder, acquired, and Thyroid disease.    SURGICAL HISTORY   has a past surgical history that includes Femur Surgery (12/2014); Thyroid surgery (2005); fracture surgery; Stimulator Surgery (N/A, 02/11/2020); Stimulator Surgery (N/A, 02/25/2020); and Bladder surgery (03/11/2021).  Additional information:       ALLERGIES   Allergies as of 12/01/2021    (No Known Allergies)     Additional information:       MEDICATIONS   Aspirin  [x]  81 mg  []  325 mg  []  None  Antiplatelet drug therapy use last 5 days  [] No [] Yes  Coumadin Use Last 5 Days [] No [] Yes  Other anticoagulant use last 5 days  [] No [] Yes    Current Facility-Administered Medications:     sodium chloride flush 0.9 % injection 5-40 mL, 5-40 mL, IntraVENous, 2 times per day,  Wooddell, Nicole B, APRN - CNP    sodium chloride flush 0.9 % injection 5-40 mL, 5-40 mL, IntraVENous, PRN, Wooddell, Nicole B, APRN - CNP    0.9 % sodium chloride infusion, , IntraVENous, PRN, Modena Jansky, Maryella Shivers, APRN - CNP    aspirin tablet 325 mg, 325 mg, Oral, Once, Wooddell, Nicole B, APRN - CNP    nitroGLYCERIN (NITROSTAT) SL tablet 0.4 mg, 0.4 mg, SubLINGual, Q5 Min PRN, Wooddell, Nicole B, APRN - CNP    0.9 % sodium chloride infusion, , IntraVENous, Continuous, Denis Koppel A, MD  Prior to Admission medications    Medication Sig Start Date End Date Taking? Authorizing Provider   lisinopril (PRINIVIL;ZESTRIL) 20 MG tablet Take 1 tablet by mouth daily    [provider]   Multiple Vitamins-Minerals (THERAPEUTIC MULTIVITAMIN-MINERALS) tablet Take 1 tablet by mouth daily    [provider]   metOLazone (ZAROXOLYN) 2.5 MG tablet 1 tablet Twice a week 08/18/20   [provider]   Cholecalciferol (VITAMIN D3) 50 MCG (2000 UT) CAPS Take by mouth    [provider]   D-Mannose 500 MG CAPS Take by mouth 2 times daily     [provider]   Ascorbic Acid (VITAMIN C) 500 MG CHEW Take 500 mg by mouth daily    [provider]   metoprolol succinate (TOPROL XL) 50 MG extended release tablet Take 1 tablet by mouth daily    [provider]   furosemide (  LASIX) 20 MG tablet 1 tablet Patient taking 1/2 tablet to 1 tablet daily 08/20/18   [provider]   potassium chloride (KLOR-CON) 10 MEQ extended release tablet  07/10/18   [provider]   nitroGLYCERIN (NITROSTAT) 0.4 MG SL tablet up to max of 3 total doses. If no relief after 1 dose, call 911. 04/11/15   Marcell Anger, MD   acetaminophen (TYLENOL) 500 MG tablet Take 1 tablet by mouth every 6 hours as needed for Pain    [provider]   warfarin (COUMADIN) 3 MG tablet Take 1 tablet by mouth daily    [provider]   levothyroxine (SYNTHROID) 100 MCG tablet Take 125 mcg  by mouth Daily    [provider]   IRON PO Take 65 mg by mouth 2 times daily  Patient not taking: Reported on 12/01/2021    [provider]     Additional information:       VITAL SIGNS   There were no vitals filed for this visit.    PHYSICAL:   General: No acute distress  HEENT:  Unremarkable for age  Neck: without increased JVD, carotid pulses 2+ bilaterally without bruits  Heart: RRR, S1 & S2 WNL, S4 gallop, without murmurs or rubs    Lungs: Clear to auscultation    Abdomen: BS present, without HSM, masses, or tenderness    Extremities: without C,C,E.  Pulses 2+ bilaterally  Mental Status: Alert & Oriented        PLANNED PROCEDURE   [x] Cath  [] PCI                [] Pacemaker/AICD  [] TEE             [] Cardioversion [] Peripheral angiography/PTA  [] Other:      SEDATION  Planned agent:[x] Midazolam [] Meperidine [x] Sublimaze [] Morphine  [] Diazepam  [] Other:     ASA Classification:  [] 1 [x] 2 [] 3 [] 4 [] 5  Class 1: A normal healthy patient  Class 2: Pt with mild to moderate systemic disease  Class 3: Severe systemic disease or disturbance  Class 4: Severe systemic disorders that are already life threatening.  Class 5: Moribund pt with little chances of survival, for more than 24 hours.  Mallampati I Airway Classification:   [] 1 [x] 2 [] 3 [] 4    [x] Pre-procedure diagnostic studies complete and results available.   Comment:    [x] Previous sedation/anesthesia experiences assessed.   Comment:    [x] The patient is an appropriate candidate to undergo the planned procedure sedation and anesthesia. (Refer to nursing sedation/analgesia documentation record)  [x] Formulation and discussion of sedation/procedure plan, risks, and expectations with patient and/or responsible adult completed.  [x] Patient examined immediately prior to the procedure. (Refer to nursing sedation/analgesia documentation record)    , MD MD [facility]  Electronically signed 12/22/2021 at 11:34 AM

## 2021-12-22 NOTE — Other (Signed)
12/22/21 1645   AVS Reviewed   AVS & discharge instructions reviewed with patient and/or representative? Yes   Reviewed instructions with Patient;Other (name and relationship in comment)  (Husband and daughter)   Level of Understanding Questions answered;Teach back completed;Verbalized understanding

## 2021-12-22 NOTE — Discharge Instructions (Addendum)
Take blood pressure 2-3 times a day and record it.  Take record with you to your doctor's appointment      INR 1.66 on 12/22/2021      Discharge Instructions for Radial Heart Catherization  1.  Take it easy for 3-4 days.  2.  No driving for 2 days.  3.  No lifting of 5 lbs or more for 5 days with the affected arm.  4.  May shower after 24 hours.  5.  Remove arm board after 24 hours.  6.  Apply a band aid to the insertion site daily for 5 days.  Wash site daily with soap and water.  7.  No creams, ointments, or powders near the insertion site.   8.  No tub baths, swimming, hot tubs, or hand washing dishes for 1 week.  9.  Watch for signs of infection (redness, warmth, swelling, or pus drainage) or coolness of extremity and call physician if this occurs  10.  If bleeding occurs from insertion site, apply pressure and call 911.   11. Watch for numbness/tingling- if this occurs go to the Emergency Room immediately.  12. Apply Ice for 15 minutes with an hour break in between and alternate with heat compress for 15 minutes to reduce swelling for 3-5 days.

## 2021-12-23 ENCOUNTER — Telehealth

## 2021-12-23 LAB — EKG 12-LEAD
Atrial Rate: 234 {beats}/min
Q-T Interval: 374 ms
QRS Duration: 114 ms
QTc Calculation (Bazett): 496 ms
R Axis: -42 degrees
T Axis: 105 degrees
Ventricular Rate: 106 {beats}/min

## 2021-12-23 NOTE — Telephone Encounter (Signed)
Clinic visit to disuss.

## 2021-12-23 NOTE — Telephone Encounter (Signed)
Impression:  Typical atrial flutter with controlled ventricular rate.  Paroxysmal atrial fibrillation failed Flecainide.  CHA2DS2-VASc score 6.  On warfarin, INR 2.4.  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  Bilateral lower extremity swelling.  HTN and hx of CVA.   ? left pleural effusion.      Assessment And Recommendations:  The patient is currently rate controlled for atrial flutter.  Not sure about the nature of her cardiomyopathy.  Could be related to the atrial arrhythmias.  However, we will proceed with left heart catheterization to exclude significant coronary artery disease.  She will benefit with rhythm control, will decide about this after her left heart cath.  Chest x-ray just to rule out pleural effusion.  Has not tolerated Entresto in the past.  Continue lisinopril, metoprolol succinate and diuretics.  Will need optimization of her volume status.  Will benefit with referral to heart failure clinic.     Electronically signed by Tawanna Solo, MD, MRCP, [facility], [facility] on 12/01/2021 at 9:06 AM     Please see heart cath results- 11.29.2023- please advise  No future follow up scheduled with Dr. Charlynn Court

## 2021-12-24 NOTE — Telephone Encounter (Signed)
Scheduled appt with [place]ak[place]  Aware of referral to CHF clinic.  Wants seen sooner rather than later.  Will discuss with CHF

## 2021-12-24 NOTE — Telephone Encounter (Signed)
CHF clinic appt made

## 2021-12-28 ENCOUNTER — Encounter: Admit: 2021-12-28 | Discharge: 2021-12-28 | Payer: MEDICARE | Attending: Family | Primary: Family Medicine

## 2021-12-28 DIAGNOSIS — I5023 Acute on chronic systolic (congestive) heart failure: Secondary | ICD-10-CM

## 2021-12-28 MED ORDER — METOPROLOL SUCCINATE ER 50 MG PO TB24
50 MG | ORAL_TABLET | Freq: Every day | ORAL | 3 refills | Status: DC
Start: 2021-12-28 — End: 2022-01-28

## 2021-12-28 MED ORDER — SACUBITRIL-VALSARTAN 24-26 MG PO TABS
24-26 MG | ORAL_TABLET | Freq: Two times a day (BID) | ORAL | 3 refills | Status: DC
Start: 2021-12-28 — End: 2022-03-09

## 2021-12-28 NOTE — Progress Notes (Signed)
Heart Failure Clinic       Visit Date: 12/28/2021  Cardiologist:  Dr. Geradine Girt - Melvenia Beam   Primary Care Physician: Dr. Elton Sin, MD    Michelle Bright is a 71 y.o. female who presents today for:  Chief Complaint   Patient presents with    Congestive Heart Failure       HPI:     TYPE HF: HFrEF 25-30% (58% 2018)  Cause: nonischemic (newly found   Device: none  HX: Pafib (2016), CVA  Dry Wt:  94 on 12/28/21      Michelle Bright is a 70 y.o. female who presents to the office for a new patient visit in the heart failure clinic.    Concerns today: here today as a new pt referred by Dr. Corrinne Eagle for management of her newly found systolic HF. Pt started with a new onset of Afib in 2016 found during a surgery. She has never had a cardioversion or an ablation done. She was started on an Eliquis but did not tolerate it so it was stopped. She then suffered a CVA in 2018 with left sided deficits that have since resolved, she was started on coumadin at this time. Dr. Anastasio Champion ordered an ECHO in October d/t episodes of HTN, return of swelling, and SOB and was found to have an EF of 25-30%. Prior to the ECHO she was started on metolazone with improvement of her symptoms. She has been on lasix for yrs with good urination. She also had noted orthopnea prior to her metolazone use. She does follow a low Na/fluid diet.       Patient follows:      Hospitalization:        Activity: ADLs without limitation   Diet: Educated and following    Patient has:  Chest Pain: no  SOB: improved on metolazone  Orthopnea/PND: improved on metolazone  OSA: no  Edema: improved  Fatigue: yes - poor sleep routine  Abdominal bloating: no  Cough: no  Appetite: good      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 bladder,  acquired     Thyroid disease     Removed      Past Surgical History:   Procedure Laterality Date    BLADDER SURGERY  03/11/2021    pessary    CARDIAC PROCEDURE Left 12/22/2021    Left heart cath / coronary angiography performed by Archie Balboa, MD at [facility] CARDIAC CATH LAB    FEMUR SURGERY  12/2014    left femur    FRACTURE SURGERY      STIMULATOR SURGERY N/A 02/11/2020    STAGE 1 INTERSTIM performed by Helane Rima, MD at [facility] OR    STIMULATOR SURGERY N/A 02/25/2020    STAGE 2 STIMULATOR INSERTION performed by Helane Rima, MD at [facility] OR    THYROID SURGERY  2005     History reviewed. No pertinent family history.  Social History     Tobacco Use    Smoking status: Former     Types: Cigarettes     Quit date: 03/09/1992     Years since quitting: 29.8  Smokeless tobacco: Never   Substance Use Topics    Alcohol use: Not Currently     Current Outpatient Medications   Medication Sig Dispense Refill    sacubitril-valsartan (ENTRESTO) 24-26 MG per tablet Take 1 tablet by mouth 2 times daily 180 tablet 3    metoprolol succinate (TOPROL XL) 50 MG extended release tablet Take 1.5 tablets by mouth daily 135 tablet 3    Ubiquinol (QUNOL COQ10/UBIQUINOL/MEGA) 100 MG CAPS Take 1 tablet by mouth daily      Multiple Vitamins-Minerals (THERAPEUTIC MULTIVITAMIN-MINERALS) tablet Take 1 tablet by mouth daily      Cholecalciferol (VITAMIN D3) 50 MCG (2000 UT) CAPS Take by mouth      D-Mannose 500 MG CAPS Take by mouth 2 times daily       Ascorbic Acid (VITAMIN C) 500 MG CHEW Take 500 mg by mouth daily      furosemide (LASIX) 20 MG tablet Take 2 tablets by mouth daily      potassium chloride (KLOR-CON) 10 MEQ extended release tablet       nitroGLYCERIN (NITROSTAT) 0.4 MG SL tablet up to max of 3 total doses. If no relief after 1 dose, call 911. 25 tablet 3    acetaminophen (TYLENOL) 500 MG tablet Take 1 tablet by mouth every 6 hours as needed for Pain      warfarin (COUMADIN) 3 MG tablet Take 1 tablet by mouth daily      levothyroxine  (SYNTHROID) 100 MCG tablet Take 125 mcg by mouth Daily      metOLazone (ZAROXOLYN) 2.5 MG tablet 1 tablet Twice a week (Patient not taking: Reported on 12/28/2021)       No current facility-administered medications for this visit.     No Known Allergies    SUBJECTIVE:   Review of Systems   Constitutional:  Positive for fatigue. Negative for activity change and appetite change.   Respiratory:  Positive for shortness of breath. Negative for cough and wheezing.    Cardiovascular:  Negative for chest pain, palpitations and leg swelling.   Gastrointestinal:  Negative for abdominal distention and abdominal pain.   Musculoskeletal:  Negative for gait problem.   Neurological:  Negative for weakness, light-headedness and headaches.   Psychiatric/Behavioral:  Negative for sleep disturbance.        OBJECTIVE:   Today's Vitals:  BP (!) 146/88   Pulse (!) 105   Ht 1.499 m (4\' 11" )   Wt 42.6 kg (94 lb)   SpO2 96%   BMI 18.99 kg/m     Physical Exam  Vitals reviewed.   Constitutional:       General: She is not in acute distress.     Appearance: Normal appearance. She is well-developed. She is not diaphoretic.   HENT:      Head: Normocephalic and atraumatic.   Eyes:      Conjunctiva/sclera: Conjunctivae normal.   Cardiovascular:      Rate and Rhythm: Tachycardia present. Rhythm irregular.      Heart sounds: Murmur (grade 1-2) heard.   Pulmonary:      Effort: Pulmonary effort is normal. No respiratory distress.      Breath sounds: Normal breath sounds. No wheezing, rhonchi or rales.   Abdominal:      General: Bowel sounds are normal. There is no distension.      Palpations: Abdomen is soft.      Tenderness: There is no abdominal tenderness.   Musculoskeletal:  General: Normal range of motion.      Cervical back: Normal range of motion and neck supple.      Right lower leg: No edema.      Left lower leg: No edema.   Skin:     General: Skin is warm and dry.      Capillary Refill: Capillary refill takes less than 2 seconds.    Neurological:      Mental Status: She is alert and oriented to person, place, and time.      Coordination: Coordination normal.   Psychiatric:         Behavior: Behavior normal.         Wt Readings from Last 3 Encounters:   12/28/21 42.6 kg (94 lb)   12/22/21 46.3 kg (102 lb)   12/01/21 47.1 kg (103 lb 12.8 oz)     BP Readings from Last 3 Encounters:   12/28/21 (!) 146/88   12/22/21 (!) 152/96   12/01/21 129/61     Pulse Readings from Last 3 Encounters:   12/28/21 (!) 105   12/22/21 92   12/01/21 83     Body mass index is 18.99 kg/m.    ECHO:       CATH/STRESS:     Patent coronaries 2023      Results reviewed:  BNP: No results found for: "BNP"  CBC:   Lab Results   Component Value Date/Time    WBC 8.4 12/22/2021 12:04 PM    RBC 4.98 12/22/2021 12:04 PM    RBC 17 01/21/2020 09:08 AM    HGB 15.7 12/22/2021 12:04 PM    HCT 50.9 12/22/2021 12:04 PM    PLT 157 12/22/2021 12:04 PM     CMP:    Lab Results   Component Value Date/Time    NA 140 12/22/2021 12:04 PM    K 3.8 12/22/2021 12:04 PM    CL 99 12/22/2021 12:04 PM    CO2 29 12/22/2021 12:04 PM    BUN 19 12/22/2021 12:04 PM    CREATININE 0.7 12/22/2021 12:04 PM    LABGLOM >60 12/22/2021 12:04 PM    GLUCOSE 101 12/22/2021 12:04 PM    CALCIUM 8.7 12/22/2021 12:04 PM     Hepatic Function Panel:    Lab Results   Component Value Date/Time    ALKPHOS 85 04/11/2015 05:15 AM    ALT 12 04/11/2015 05:15 AM    AST 17 04/11/2015 05:15 AM    PROT 7.2 04/11/2015 05:15 AM    BILITOT Negative 01/21/2020 09:08 AM    BILIDIR <0.2 04/10/2015 02:03 PM    LABALBU 3.9 04/11/2015 05:15 AM     Magnesium:    Lab Results   Component Value Date/Time    MG 1.8 04/09/2015 02:30 PM     PT/INR:    Lab Results   Component Value Date/Time    INR 1.66 12/22/2021 12:04 PM     Lipids:    Lab Results   Component Value Date/Time    TRIG 107 04/11/2015 05:15 AM    HDL 64 04/11/2015 05:15 AM    LDLCALC 71 04/11/2015 05:15 AM       ASSESSMENT AND PLAN:   The patient's condition/symptoms are stable         Diagnosis Orders   1. Acute on chronic systolic congestive heart failure, NYHA class 2, Stage C (HCC)  Basic Metabolic Panel      2. Paroxysmal A-fib (HCC)  Extended cardiac holter monitor (48  hrs - 15 days)      3. At risk for fluid volume overload        4. Nonrheumatic mitral valve regurgitation  TEE (PRN contrast/bubbles/3D)      5. Nonischemic cardiomyopathy (HCC)          Continue:  GDMT:   ACE/ARB/ARNI - stopping and starting Entresto 24/26 BID   BB - toprol 50 daily   Diuretic - Lasix 40 daily  AA -   SGLT2 -    Vasodilator -   Other - coumadin, KCL 10 daily, synthroid      HFrEF 25-30%  Nonischemic - valvular vs chronic ? - need  a TEE  MR   TR  Paroxsymal atrial fibrillation - holter monitor ordered- on coumadin     Stable, no fluid on exam today, improved post metolazone. Will change to [facility]. Two week monitor to evaluate afib burden. Increasing Toprol d/t tachycardia. TEE ordered for further evaluation on of mitral valve. May need to consider PFT in the future - changes noted on chest xray and hx of smoking. 4 week f/u.     GDMT: will start with Delene Loll - will need a pt assistance     Lab reviewed - K 3.8 Cr 0.7 Hgb 15.7  ECHO 2023: 2-3+ MR, 3-4+ TR, +1 AI, RVSP 50, mild LA dilation,   CATH 2023: patent coronaries     Repeat blood work in two weeks    Stop Lisinopril     Start Entresto 24/26 BID on Friday    Increase toprol to 75mg  daily (1.5 tabs)    Two week holter monitor ordered - if in 100% afib will consider a cardioversion    Continue diet/fluid adherence  Continue daily wts.  F/U w/ Cardiology  F/U in clinic in 4 weeks      Tolerating above noted HF meds, no ill side effects noted. Will continue to monitor kidney function and electrolytes. Will optimize as tolerated.   Pt is compliant w/ medications.    Total visit time of  minutes has been spent with patient on education of symptoms, management, medication, and plan of care; as well as review of chart: labs, ECHO, radiology reports, etc.   I  personally spent more then 50% of the appt time face to face with the patient.  Daily weights  Fluid restriction of 2 Liters per day  Limit sodium in diet to around 1500-2000 mg/day  Monitor BP  Activity as tolerated     Patient was instructed to call the [facility] for any changes in symptoms as noted in AVS.      Return in about 4 weeks (around 01/25/2022). or sooner if needed     Patient given educational materials - see patient instructions.   We discussed the importance of weighing oneself and recording daily. We also discussed the importance of a low sodium diet, higher sodium foods to avoid and better low sodium food options.   Patient verbalizes understanding of plan of care using teach back method, and is agreeable to the treatment plan.       Electronically signed by Juluis Pitch, APRN - CNP on 12/28/2021 at 1:46 PM

## 2021-12-28 NOTE — Patient Instructions (Addendum)
You may receive a survey regarding the care you received during your visit.  Your input is valuable to [place].  We encourage you to complete and return your survey.  We hope you will choose [place] in the future for your healthcare needs.    Your nurses today were [place] and [employer].  Office hours:   Mon-Thurs 8-4:30  Friday 8-12  Phone: [phone]    Continue:  Continue current medications  Daily weights and record  Fluid restriction of 2 Liters per day  Limit sodium in diet to around 1500-2000 mg/day  Monitor BP  Activity as tolerated     Call the [facility] for any of the following symptoms:   Weight gain of 2-3 pounds in 1 day or 5 pounds in 1 week  Increased shortness of breath  Shortness of breath while laying down  Cough  Chest pain  Swelling in feet, ankles or legs  Bloating in abdomen  Fatigue      Repeat blood work in two weeks    Stop Lisinopril     Start Entresto 24/26 BID on Friday    Increase toprol to 75mg  daily (1.5 tabs)    Two week holter monitor ordered - if in 100% afib will consider a cardioversion    Continue diet/fluid adherence  Continue daily wts.  F/U w/ Cardiology  F/U in clinic in 4 weeks

## 2021-12-30 NOTE — Telephone Encounter (Signed)
Cindy/Dr. Harrietta Guardian Office is requesting office notes on the patients 09-08-21 visit. Please fax to [phone]

## 2021-12-30 NOTE — Telephone Encounter (Signed)
faxed

## 2022-01-03 MED ORDER — SODIUM CHLORIDE 0.9 % IV SOLN
0.9 % | INTRAVENOUS | Status: AC
Start: 2022-01-03 — End: 2022-01-05
  Administered 2022-01-05: 18:00:00 via INTRAVENOUS

## 2022-01-03 MED ORDER — NORMAL SALINE FLUSH 0.9 % IV SOLN
0.9 % | Freq: Two times a day (BID) | INTRAVENOUS | Status: AC
Start: 2022-01-03 — End: 2022-01-05

## 2022-01-03 MED ORDER — SODIUM CHLORIDE 0.9 % IV SOLN
0.9 % | INTRAVENOUS | Status: AC | PRN
Start: 2022-01-03 — End: 2022-01-05

## 2022-01-03 MED ORDER — NORMAL SALINE FLUSH 0.9 % IV SOLN
0.9 % | INTRAVENOUS | Status: AC | PRN
Start: 2022-01-03 — End: 2022-01-05

## 2022-01-05 ENCOUNTER — Ambulatory Visit: Admit: 2022-01-05 | Payer: MEDICARE | Primary: Family Medicine

## 2022-01-05 ENCOUNTER — Inpatient Hospital Stay: Payer: Medicare (Managed Care) | Attending: Cardiovascular Disease

## 2022-01-05 LAB — TEE (PRN CONTRAST/BUBBLE/3D)
Body Surface Area: 1.31 m2
MR Peak Gradient: 135 mmHg
MR Peak Velocity: 5.8 m/s
MR Radius PISA: 0.7 cm
MR VTI: 174 cm
MV Nyquist Velocity: 39 cm/s

## 2022-01-05 MED ORDER — FENTANYL CITRATE (PF) 100 MCG/2ML IJ SOLN
100 MCG/2ML | INTRAMUSCULAR | Status: AC
Start: 2022-01-05 — End: ?

## 2022-01-05 MED ORDER — MIDAZOLAM HCL 5 MG/5ML IJ SOLN
5 MG/ML | INTRAMUSCULAR | Status: DC | PRN
Start: 2022-01-05 — End: 2022-01-05
  Administered 2022-01-05 (×2): 1 via INTRAVENOUS

## 2022-01-05 MED ORDER — FENTANYL CITRATE (PF) 100 MCG/2ML IJ SOLN
100 MCG/2ML | INTRAMUSCULAR | Status: DC | PRN
Start: 2022-01-05 — End: 2022-01-05
  Administered 2022-01-05: 20:00:00 25 via INTRAVENOUS

## 2022-01-05 MED ORDER — MIDAZOLAM HCL 5 MG/5ML IJ SOLN
5 MG/ML | INTRAMUSCULAR | Status: AC
Start: 2022-01-05 — End: ?

## 2022-01-05 MED FILL — FENTANYL CITRATE (PF) 100 MCG/2ML IJ SOLN: 100 MCG/2ML | INTRAMUSCULAR | Qty: 2

## 2022-01-05 MED FILL — MIDAZOLAM HCL 5 MG/5ML IJ SOLN: 5 MG/ML | INTRAMUSCULAR | Qty: 5

## 2022-01-05 NOTE — H&P (Signed)
[facility]  Sedation/Analgesia History & Physical    Pt Name: Michelle Bright  Account number: [id number]  MRN: [id number]  Date of Birth: 1950-08-21  Provider Performing Procedure: Archie Balboa, MD MD [facility]  Primary Care Physician: Elton Sin, MD  Date: 01/05/2022    PRE-PROCEDURE    Code Status: FULL CODE  Brief History/Pre-Procedure Diagnosis:   Watchman      Consent: : I have discussed with the patient risks, benefits, and alternatives (and relevant risks, benefits, and side effects related to alternatives or not receiving care), and likelihood of the success.   The patient and/or representative appear to understand and agree to proceed.  The discussion encompasses risks, benefits, and side effects related to the alternatives and the risks related to not receiving the proposed care, treatment, and services.         MEDICAL HISTORY  [] ASHD/ANGINA/MI/CHF   [x] Hypertension  [] Diabetes  [] Hyperlipidemia  [] Smoking  [] Family Hx of ASHD  [] Additional information:       has a past medical history of Arthritis, Atrial fibrillation (HCC), CAD (coronary artery disease), Cerebral artery occlusion with cerebral infarction (HCC), Hypertension, Osteoporosis, Prolapse of female bladder, acquired, and Thyroid disease.    SURGICAL HISTORY   has a past surgical history that includes Femur Surgery (12/2014); Thyroid surgery (2005); fracture surgery; Stimulator Surgery (N/A, 02/11/2020); Stimulator Surgery (N/A, 02/25/2020); Bladder surgery (03/11/2021); and Cardiac procedure (Left, 12/22/2021).  Additional information:       ALLERGIES   Allergies as of 12/28/2021    (No Known Allergies)     Additional information:       MEDICATIONS   Aspirin  [x]  81 mg  []  325 mg  []  None  Antiplatelet drug therapy use last 5 days  [] No [] Yes  Coumadin Use Last 5 Days [] No [] Yes  Other anticoagulant use last 5 days  [] No [] Yes    Current Facility-Administered Medications:     sodium chloride flush 0.9 % injection 5-40  mL, 5-40 mL, IntraVENous, 2 times per day, Issachar Broady A, MD    sodium chloride flush 0.9 % injection 5-40 mL, 5-40 mL, IntraVENous, PRN, [name] Hunzeker A, MD    0.9 % sodium chloride infusion, 25 mL, IntraVENous, PRN, Keigo Whalley A, MD    0.9 % sodium chloride infusion, , IntraVENous, Continuous, Katria Botts A, MD, Last Rate: 75 mL/hr at 01/05/22 1315, New Bag at 01/05/22 1315  Prior to Admission medications    Medication Sig Start Date End Date Taking? Authorizing Provider   sacubitril-valsartan (ENTRESTO) 24-26 MG per tablet Take 1 tablet by mouth 2 times daily 12/28/21   12/24/2021, APRN - CNP   metoprolol succinate (TOPROL XL) 50 MG extended release tablet Take 1.5 tablets by mouth daily 12/28/21   , APRN - CNP   Ubiquinol (QUNOL COQ10/UBIQUINOL/MEGA) 100 MG CAPS Take 1 tablet by mouth daily    [provider]   Multiple Vitamins-Minerals (THERAPEUTIC MULTIVITAMIN-MINERALS) tablet Take 1 tablet by mouth daily    [provider]   metOLazone (ZAROXOLYN) 2.5 MG tablet 1 tablet Twice a week  Patient not taking: Reported on 12/28/2021 08/18/20   [provider]   Cholecalciferol (VITAMIN D3) 50 MCG (2000 UT) CAPS Take by mouth    [provider]   D-Mannose 500 MG CAPS Take by mouth 2 times daily     [provider]   Ascorbic Acid (VITAMIN C) 500 MG CHEW Take 500 mg by mouth daily  [provider]   furosemide (LASIX) 20 MG tablet Take 2 tablets by mouth daily 08/20/18   [provider]   potassium chloride (KLOR-CON) 10 MEQ extended release tablet  07/10/18   [provider]   nitroGLYCERIN (NITROSTAT) 0.4 MG SL tablet up to max of 3 total doses. If no relief after 1 dose, call 911. 04/11/15   Sallee Provencal, MD   acetaminophen (TYLENOL) 500 MG tablet Take 1 tablet by mouth every 6 hours as needed for Pain    [provider]   warfarin (COUMADIN) 3 MG tablet Take 1 tablet by mouth daily     [provider]   levothyroxine (SYNTHROID) 100 MCG tablet Take 125 mcg by mouth Daily    [provider]     Additional information:       VITAL SIGNS   Vitals:    01/05/22 1312   BP: (!) 153/86   Pulse: (!) 110   Resp: 18   Temp: 97.5 F (36.4 C)   SpO2: 100%       PHYSICAL:   General: No acute distress  HEENT:  Unremarkable for age  Neck: without increased JVD, carotid pulses 2+ bilaterally without bruits  Heart: RRR, S1 & S2 WNL, S4 gallop, without murmurs or rubs    Lungs: Clear to auscultation    Abdomen: BS present, without HSM, masses, or tenderness    Extremities: without C,C,E.  Pulses 2+ bilaterally  Mental Status: Alert & Oriented        PLANNED PROCEDURE   [] Cath  [] PCI                [] Pacemaker/AICD  [x] TEE             [] Cardioversion [] Peripheral angiography/PTA  [] Other:      SEDATION  Planned agent:[x] Midazolam [] Meperidine [x] Sublimaze [] Morphine  [] Diazepam  [] Other:     ASA Classification:  [] 1 [x] 2 [] 3 [] 4 [] 5  Class 1: A normal healthy patient  Class 2: Pt with mild to moderate systemic disease  Class 3: Severe systemic disease or disturbance  Class 4: Severe systemic disorders that are already life threatening.  Class 5: Moribund pt with little chances of survival, for more than 24 hours.  Mallampati I Airway Classification:   [] 1 [x] 2 [] 3 [] 4    [x] Pre-procedure diagnostic studies complete and results available.   Comment:    [x] Previous sedation/anesthesia experiences assessed.   Comment:    [x] The patient is an appropriate candidate to undergo the planned procedure sedation and anesthesia. (Refer to nursing sedation/analgesia documentation record)  [x] Formulation and discussion of sedation/procedure plan, risks, and expectations with patient and/or responsible adult completed.  [x] Patient examined immediately prior to the procedure. (Refer to nursing sedation/analgesia documentation record)    Veverly Fells, MD MD [facility]  Electronically signed 01/05/2022 at 2:00  PM

## 2022-01-05 NOTE — Progress Notes (Signed)
TEE complete, photos per ECHO tech, bubble study complete, pt tolerated procedure well.

## 2022-01-05 NOTE — Progress Notes (Signed)
Kathy in phase 2 from TEE with Dr.Baki. Family/driver at bedside with pt. Dr.Baki discussed findings with pt. Discharge insturctions discussed with pt. Discharge papers signed and given to pt. Pt discharged home/facility.

## 2022-01-06 MED FILL — GLYDO 2 % EX PRSY: 2 % | CUTANEOUS | Qty: 6

## 2022-01-06 NOTE — Telephone Encounter (Signed)
Cindy from Dr. Rozann Lesches office calling.  Jenny Reichmann is unsure why they received strips due to pt seeing Dr. Charlynn Court.    Jenny Reichmann would like to be notified when Emilie responds.  [phone]

## 2022-01-06 NOTE — Telephone Encounter (Signed)
Fax received from Dr Harrietta Guardian office with event monitor strips.   5 seconds conversion pause. Aflutter to SR     Call to [facility], made her aware that Dr Charlynn Court is out of the office until January. She will let Dr Harrietta Guardian know.

## 2022-01-06 NOTE — Telephone Encounter (Signed)
Please see attached event monitor strips

## 2022-01-06 NOTE — Telephone Encounter (Signed)
Patient notified

## 2022-01-06 NOTE — Telephone Encounter (Signed)
-----   Message from [place], APRN - CNP sent at 01/06/2022 12:27 PM EST -----  TEE shows continued valvular disease - no worse than noted previously. No changes and will continue to monitor.

## 2022-01-06 NOTE — Telephone Encounter (Signed)
FYI: Hakim OOT until Jan

## 2022-01-06 NOTE — Telephone Encounter (Signed)
Spoke to husband who said Dr Harrietta Guardian office called him and addressed the event monitor strips

## 2022-01-10 ENCOUNTER — Telehealth

## 2022-01-10 LAB — BASIC METABOLIC PANEL
BUN: 25 mg/dL — ABNORMAL HIGH
CO2: 30 mmol/L — ABNORMAL HIGH
Calcium: 9.4 mg/dL
Chloride: 102 mmol/L
Creatinine: 0.6
Est, Glomerular Filtration Rate: >90
Glucose: 178 mg/dL — ABNORMAL HIGH
Potassium: 4.2 mmol/L
Sodium: 141 mmol/L

## 2022-01-10 NOTE — Telephone Encounter (Signed)
Spoke to patient husband. Okay with SGLT2 ([facility]) and labs in 6 weeks.  Will need to be put on [facility] for cost of medications.  Will call tomorrow with [facility] and pharmacy card

## 2022-01-10 NOTE — Telephone Encounter (Signed)
-----   Message from Juluis Pitch, APRN - CNP sent at 01/10/2022  4:19 PM EST -----  Labs reviewed - stable on Entresto - we had discussed medication optimization at the appointment. If pt willing will add SGLT2 with repeat labs in 6 weeks. If no we can discuss further at appointment on 1/5

## 2022-01-11 MED ORDER — DAPAGLIFLOZIN PROPANEDIOL 10 MG PO TABS
10 MG | ORAL_TABLET | Freq: Every morning | ORAL | 3 refills | Status: AC
Start: 2022-01-11 — End: 2022-12-14

## 2022-01-11 NOTE — Telephone Encounter (Signed)
Called to pharmacy. Updated pharmacy card with [facility]  Called to patient, no answer. UTLM

## 2022-01-11 NOTE — Telephone Encounter (Signed)
Husband notified

## 2022-01-11 NOTE — Telephone Encounter (Signed)
Farxiga 10 daily called in   Blood work ordered

## 2022-01-11 NOTE — Telephone Encounter (Signed)
Can you send which SGLT2 you would like to patients pharmacy to check cost and put on [facility]?  thanks

## 2022-01-18 ENCOUNTER — Encounter

## 2022-01-18 NOTE — Telephone Encounter (Signed)
Pt also sees Dr Charlynn Court   Will send him this encounter as soon as results are in epic

## 2022-01-18 NOTE — Telephone Encounter (Signed)
I am out of office. Unable to see what the event was? Can send to Dr. Harrietta Guardian who is the primary cardiologist.

## 2022-01-18 NOTE — Telephone Encounter (Signed)
Per Dr Irene Limbo pt had abn event and wanted Emilie to review

## 2022-01-18 NOTE — Telephone Encounter (Signed)
Event strips in media tab.     Call to patient. Spoke with husband [place]. Believes pt was asleep during pause. If she was up, it was just to use the bathroom. No signs/symptoms of pause. Stated Dr Harrietta Guardian did decrease her metoprolol to 25 mg daily (from 75 mg). Med list updated.     Confirmed CHF appt 01/27/22 and Dr Charlynn Court appt 03/09/22

## 2022-01-18 NOTE — Telephone Encounter (Signed)
I CALLED [employer] AND THEY WILL FAX OVER ALL THE MONITOR STRIPS TO SEE ALL THE PAUSES

## 2022-01-18 NOTE — Telephone Encounter (Signed)
Event monitor results in chart.   See's Dr. Charlynn Court on 03-09-22.

## 2022-01-19 NOTE — Telephone Encounter (Signed)
Needs to be seen by her cardiologist and assessed.

## 2022-01-19 NOTE — Telephone Encounter (Signed)
I CALLED AND SPOKE TO THIS PT AND SHE SAID SHE WAS ASLEEP WHEN SHE HAD THIS PAUSE ON 01/06/22 AT 1:30 AM.     WE GOT THE RESULTS OF THE MONITOR FROM [place] AND IT ONLY SHOWS 1 PAUSE ON 01/06/22    PT TOLD ME SHE HAD NO S/S OF LT HEADED OR DIZZINESS AND NO FEELINGS OF PASSING OUT WITH THIS PAUSE ON 01/06/22    SHE WORE A 48 HOUR HOLTER THAT WAS TURNED IN AND SHE WAS SEEN BY DR Harrietta Guardian ON 01/11/22 AND HE ADDRESSED AND HE TOLD PT TO BE SEEN BY DR HAKIM     PT HAS A F/U WITH DR HAKIM ON 03/09/21     I EXPLAINED TO THIS HUSBAND THAT IF THIS PT IS AWAKE AND SHE IS SYMPTOMATIC, SUCH AS LT HEADED , DIZZY OR FEELS LIKE PASSING OUT, SHE NEEDS TO GET TO THE ER ASAP.     PT ALSO HAS A F/U WITH EMILIE ON 01/28/22 FROM THE HOLTER MONITOR     PT TOLD ME SHE FEELS FINE AND SHE IS NOT HAVING ANY SYMPTOMS

## 2022-01-19 NOTE — Telephone Encounter (Signed)
Noted  

## 2022-01-28 ENCOUNTER — Encounter: Admit: 2022-01-28 | Discharge: 2022-01-28 | Payer: MEDICARE | Attending: Family | Primary: Family Medicine

## 2022-01-28 DIAGNOSIS — I428 Other cardiomyopathies: Secondary | ICD-10-CM

## 2022-01-28 MED ORDER — METOPROLOL SUCCINATE ER 25 MG PO TB24
25 MG | ORAL_TABLET | Freq: Every day | ORAL | 3 refills | Status: AC
Start: 2022-01-28 — End: ?

## 2022-01-28 NOTE — Progress Notes (Signed)
Heart Failure Clinic       Visit Date: 01/28/2022  Cardiologist:  Dr. Geradine Girt - Melvenia Beam   Primary Care Physician: Dr. Elton Sin, MD    Michelle Bright is a 72 y.o. female who presents today for:  Chief Complaint   Patient presents with    Congestive Heart Failure       HPI:     TYPE HF: HFrEF 25-30% (58% 2018)  Cause: nonischemic (newly found   Device: none  HX: Pafib (2016), CVA  Dry Wt:  94 on 12/28/21, 92 on 01/28/22      Michelle Bright is a 72 y.o. female who presents to the office for a f/u patient visit in the heart failure clinic.    Concerns today: 8 week f/u. Pt is s/p her TEE and her 48hr holter monitor. She is thus far tolerating her medications. She is getting repeat labs after her Farxiga at the end of the month. Her TEE shows moderate MR with continued low EF. Her monitor shows sinus brady with some a flutter - f/u w/ Hakim 2/14. Today she notes that since starting both the Entresto and [place] she has noted less fatigue and less SOB. She denies leg swelling or orthopnea. Following her diet.     Visit on 12/28/21: here today as a new pt referred by Dr. Corrinne Eagle for management of her newly found systolic HF. Pt started with a new onset of Afib in 2016 found during a surgery. She has never had a cardioversion or an ablation done. She was started on an Eliquis but did not tolerate it so it was stopped. She then suffered a CVA in 2018 with left sided deficits that have since resolved, she was started on coumadin at this time. Dr. Anastasio Champion ordered an ECHO in October d/t episodes of HTN, return of swelling, and SOB and was found to have an EF of 25-30%. Prior to the ECHO she was started on metolazone with improvement of her symptoms. She has been on lasix for yrs with good urination. She also had noted orthopnea prior to her metolazone use. She does follow a low Na/fluid diet.       Patient follows:      Hospitalization:        Activity: ADLs without limitation   Diet: Educated and  following    Patient has:  Chest Pain: no  SOB: resolved   Orthopnea/PND: resolved OSA: no  Edema: resolved  Fatigue: resolved   Abdominal bloating: no  Cough: no  Appetite: good      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 bladder, acquired     Thyroid disease     Removed      Past Surgical History:   Procedure Laterality Date    BLADDER SURGERY  03/11/2021    pessary    CARDIAC PROCEDURE Left 12/22/2021    Left heart cath / coronary angiography performed by Archie Balboa, MD at [facility] CARDIAC CATH LAB    FEMUR SURGERY  12/2014    left femur    FRACTURE SURGERY      STIMULATOR SURGERY N/A 02/11/2020  STAGE 1 INTERSTIM performed by Helane Rima, MD at [facility] OR    STIMULATOR SURGERY N/A 02/25/2020    STAGE 2 STIMULATOR INSERTION performed by Helane Rima, MD at [facility] OR    THYROID SURGERY  2005    TRANSESOPHAGEAL ECHOCARDIOGRAM N/A 01/05/2022    TRANSESOPHAGEAL ECHOCARDIOGRAM performed by Archie Balboa, MD at [facility] ENDOSCOPY     History reviewed. No pertinent family history.  Social History     Tobacco Use    Smoking status: Former     Current packs/day: 0.00     Types: Cigarettes     Quit date: 03/09/1992     Years since quitting: 29.9    Smokeless tobacco: Never   Substance Use Topics    Alcohol use: Not Currently     Current Outpatient Medications   Medication Sig Dispense Refill    Coenzyme Q10 (COQ10) 200 MG CAPS Take by mouth Daily      metoprolol succinate (TOPROL XL) 25 MG extended release tablet Take 0.5 tablets by mouth daily 45 tablet 3    dapagliflozin (FARXIGA) 10 MG tablet Take 1 tablet by mouth every morning 90 tablet 3    sacubitril-valsartan (ENTRESTO) 24-26 MG per tablet Take 1 tablet by mouth 2 times daily 180 tablet 3    Multiple Vitamins-Minerals (THERAPEUTIC  MULTIVITAMIN-MINERALS) tablet Take 1 tablet by mouth daily      metOLazone (ZAROXOLYN) 2.5 MG tablet 1 tablet Twice a week prn      Cholecalciferol (VITAMIN D3) 50 MCG (2000 UT) CAPS Take by mouth      D-Mannose 500 MG CAPS Take by mouth 2 times daily       Ascorbic Acid (VITAMIN C) 500 MG CHEW Take 500 mg by mouth daily      furosemide (LASIX) 20 MG tablet Take 2 tablets by mouth daily      potassium chloride (KLOR-CON) 10 MEQ extended release tablet       nitroGLYCERIN (NITROSTAT) 0.4 MG SL tablet up to max of 3 total doses. If no relief after 1 dose, call 911. 25 tablet 3    acetaminophen (TYLENOL) 500 MG tablet Take 1 tablet by mouth every 6 hours as needed for Pain      warfarin (COUMADIN) 3 MG tablet Take 1 tablet by mouth daily      levothyroxine (SYNTHROID) 100 MCG tablet Take 125 mcg by mouth Daily       No current facility-administered medications for this visit.     No Known Allergies    SUBJECTIVE:   Review of Systems   Constitutional:  Negative for activity change, appetite change and fatigue.   Respiratory:  Negative for cough, shortness of breath and wheezing.    Cardiovascular:  Negative for chest pain, palpitations and leg swelling.   Gastrointestinal:  Negative for abdominal distention and abdominal pain.   Musculoskeletal:  Negative for gait problem.   Neurological:  Negative for weakness, light-headedness and headaches.   Psychiatric/Behavioral:  Negative for sleep disturbance.        OBJECTIVE:   Today's Vitals:  BP (!) 148/68   Pulse (!) 46   Ht 1.499 m (4\' 11" )   Wt 42 kg (92 lb 8 oz)   SpO2 96%   BMI 18.68 kg/m     Physical Exam  Vitals reviewed.   Constitutional:       General: She is not in acute distress.     Appearance: Normal appearance. She is well-developed. She is not diaphoretic.  HENT:      Head: Normocephalic and atraumatic.   Eyes:      Conjunctiva/sclera: Conjunctivae normal.   Cardiovascular:      Rate and Rhythm: Tachycardia present. Rhythm irregular.      Heart sounds:  Murmur (grade 2-3) heard.   Pulmonary:      Effort: Pulmonary effort is normal. No respiratory distress.      Breath sounds: Normal breath sounds. No wheezing, rhonchi or rales.   Abdominal:      General: Bowel sounds are normal. There is no distension.      Palpations: Abdomen is soft.      Tenderness: There is no abdominal tenderness.   Musculoskeletal:         General: Normal range of motion.      Cervical back: Normal range of motion and neck supple.      Right lower leg: No edema.      Left lower leg: No edema.   Skin:     General: Skin is warm and dry.      Capillary Refill: Capillary refill takes less than 2 seconds.   Neurological:      Mental Status: She is alert and oriented to person, place, and time.      Coordination: Coordination normal.   Psychiatric:         Behavior: Behavior normal.         Wt Readings from Last 3 Encounters:   01/28/22 42 kg (92 lb 8 oz)   01/05/22 41.2 kg (90 lb 12.8 oz)   12/28/21 42.6 kg (94 lb)     BP Readings from Last 3 Encounters:   01/28/22 (!) 148/68   01/05/22 (!) 181/86   12/28/21 (!) 146/88     Pulse Readings from Last 3 Encounters:   01/28/22 (!) 46   01/05/22 86   12/28/21 (!) 105     Body mass index is 18.68 kg/m.    ECHO:     Interpretation Summary: 12/2021         Left Ventricle: Severely reduced left ventricular systolic function with a visually estimated EF of 25 - 30%. Left ventricle size is normal. Normal wall thickness. Severe global hypokinesis present.    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 Mildly calcified leaflet. Moderate regurgitation.    Tricuspid Valve: Moderate regurgitation.    Left Atrium: Left atrium is moderately dilated. Normal sized appendage. No left atrial appendage thrombus noted. No left atrial appendage mass noted.    Interatrial Septum: No interatrial shunt visualized with color Doppler. Agitated saline study was negative with and without provocation.    Right Atrium: Right atrium is mildly dilated.    Image  quality is adequate.             CATH/STRESS:     Patent coronaries 2023      Results reviewed:  BNP: No results found for: "BNP"  CBC:   Lab Results   Component Value Date/Time    WBC 8.4 12/22/2021 12:04 PM    RBC 4.98 12/22/2021 12:04 PM    RBC 17 01/21/2020 09:08 AM    HGB 15.7 12/22/2021 12:04 PM    HCT 50.9 12/22/2021 12:04 PM    PLT 157 12/22/2021 12:04 PM     CMP:    Lab Results   Component Value Date/Time    NA 141 01/10/2022 02:31 PM    K 4.2 01/10/2022 02:31 PM    CL 102 01/10/2022 02:31 PM  CO2 30 01/10/2022 02:31 PM    BUN 25 01/10/2022 02:31 PM    CREATININE 0.6 01/10/2022 02:31 PM    LABGLOM >90 01/10/2022 02:31 PM    GLUCOSE 178 01/10/2022 02:31 PM    CALCIUM 9.4 01/10/2022 02:31 PM     Hepatic Function Panel:    Lab Results   Component Value Date/Time    ALKPHOS 85 04/11/2015 05:15 AM    ALT 12 04/11/2015 05:15 AM    AST 17 04/11/2015 05:15 AM    PROT 7.2 04/11/2015 05:15 AM    BILITOT Negative 01/21/2020 09:08 AM    BILIDIR <0.2 04/10/2015 02:03 PM    LABALBU 3.9 04/11/2015 05:15 AM     Magnesium:    Lab Results   Component Value Date/Time    MG 1.8 04/09/2015 02:30 PM     PT/INR:    Lab Results   Component Value Date/Time    INR 1.66 12/22/2021 12:04 PM     Lipids:    Lab Results   Component Value Date/Time    TRIG 107 04/11/2015 05:15 AM    HDL 64 04/11/2015 05:15 AM    LDLCALC 71 04/11/2015 05:15 AM       ASSESSMENT AND PLAN:   The patient's condition/symptoms are stable        Diagnosis Orders   1. Nonischemic cardiomyopathy (HCC)  metoprolol succinate (TOPROL XL) 25 MG extended release tablet      2. Acute on chronic systolic congestive heart failure, NYHA class 2, Stage C (HCC)        3. Paroxysmal A-fib (HCC)        4. At risk for fluid volume overload        5. Nonrheumatic mitral valve regurgitation        6. Chronic systolic congestive heart failure, NYHA class 2 (HCC)  Echo (TTE) complete (PRN contrast/bubble/strain/3D)      7. Typical atrial flutter (HCC)  Echo (TTE) complete (PRN  contrast/bubble/strain/3D)        Continue:  GDMT:   ACE/ARB/ARNI - Entresto 24/26 BID   BB - toprol 12.5 daily   Diuretic - Lasix 40 daily  AA -   SGLT2 -  Farxiga 10 daily   Vasodilator -   Other - coumadin, KCL 10 daily, synthroid      HFrEF 25-30%  Nonischemic - valvular vs chronic ? - (son did pass from cardiomyopathy)   MR - mod  TR - mod  Paroxsymal atrial fibrillation - a flutter - f/u with Dr. Corrinne Eagle 2/14  Bradycardia - decrease toprol to  12.5 daily     Stable, no fluid on exam today. Feeling better on medications may need to decrease lasix with repeat labs. ECHO 3 months - re-evaluate MR and EF    GDMT: next is aldactone     Lab reviewed - K 4.2 Cr 0.6 Hgb 15.7  ECHO 2023: 2-3+ MR, 3-4+ TR, +1 AI, RVSP 50, mild LA dilation  TEE 12/2021: Mod MR, Mod TR, bi-atrial dilation, RA pressure 3, no IVC dilation   CATH 2023: patent coronaries     Start taking 12.5mg  of Toprol today - monitor HR/BP 1 hr  after medications     Complete ECHO in 3 months - monitor valves and EF with GDMT    Blood work end of the month after starting Farxiga     Continue diet/fluid adherence  Continue daily wts.  F/U w/ Cardiology  F/U in clinic in 8 weeks  Tolerating above noted HF meds, no ill side effects noted. Will continue to monitor kidney function and electrolytes. Will optimize as tolerated.   Pt is compliant w/ medications.    Total visit time of  minutes has been spent with patient on education of symptoms, management, medication, and plan of care; as well as review of chart: labs, ECHO, radiology reports, etc.   I personally spent more then 50% of the appt time face to face with the patient.  Daily weights  Fluid restriction of 2 Liters per day  Limit sodium in diet to around 1500-2000 mg/day  Monitor BP  Activity as tolerated     Patient was instructed to call the [facility] for any changes in symptoms as noted in AVS.      Return in about 8 weeks (around 03/25/2022). or sooner if needed     Patient given  educational materials - see patient instructions.   We discussed the importance of weighing oneself and recording daily. We also discussed the importance of a low sodium diet, higher sodium foods to avoid and better low sodium food options.   Patient verbalizes understanding of plan of care using teach back method, and is agreeable to the treatment plan.       Electronically signed by Juluis Pitch, APRN - CNP on 01/28/2022 at 11:59 AM

## 2022-01-28 NOTE — Patient Instructions (Addendum)
You may receive a survey regarding the care you received during your visit.  Your input is valuable to [place].  We encourage you to complete and return your survey.  We hope you will choose [place] in the future for your healthcare needs.    Your nurses today were [place] and [employer].  Office hours:   Mon-Thurs 8-4:30  Friday 8-12  Phone: [phone]    Continue:  Continue current medications  Daily weights and record  Fluid restriction of 2 Liters per day  Limit sodium in diet to around 1500-2000 mg/day  Monitor BP  Activity as tolerated     Call the [facility] for any of the following symptoms:   Weight gain of 2-3 pounds in 1 day or 5 pounds in 1 week  Increased shortness of breath  Shortness of breath while laying down  Cough  Chest pain  Swelling in feet, ankles or legs  Bloating in abdomen  Fatigue      Start taking 12.5mg  of Toprol today - monitor HR/BP 1 hr  after medications     Complete ECHO in 3 months - monitor valves and EF with GDMT      Blood work end of the month    Continue diet/fluid adherence  Continue daily wts.  F/U w/ Cardiology  F/U in clinic in 8 weeks

## 2022-02-18 LAB — BASIC METABOLIC PANEL
BUN: 25 mg/dL — ABNORMAL HIGH
CO2: 29 mmol/L
Calcium: 9.3 mg/dL
Chloride: 104 mmol/L
Creatinine: 0.7
Est, Glomerular Filtration Rate: 86
Glucose: 133 mg/dL — ABNORMAL HIGH
Potassium: 5 mmol/L
Sodium: 141 mmol/L

## 2022-02-21 ENCOUNTER — Encounter

## 2022-02-21 MED ORDER — POTASSIUM CHLORIDE ER 10 MEQ PO TBCR
10 MEQ | ORAL_TABLET | ORAL | 3 refills | Status: DC
Start: 2022-02-21 — End: 2022-03-30

## 2022-02-21 NOTE — Telephone Encounter (Signed)
Called to patient   No answer   UTLM

## 2022-02-21 NOTE — Telephone Encounter (Signed)
-----  Message from [facility], APRN - CNP sent at 02/21/2022  8:07 AM EST -----  K at 5.0 - change Kcl to 10 EOD - k level two weeks

## 2022-02-22 NOTE — Telephone Encounter (Signed)
Husband Rocky notified and verbalized understanding   He will notify patient   Lab slip mailed

## 2022-03-08 NOTE — Patient Instructions (Incomplete)
You may receive a survey regarding the care you received during your visit.  Your input is valuable to [place].  We encourage you to complete and return your survey.  We hope you will choose [place] in the future for your healthcare needs.    Thank you for choosing [facility]!    Your Medical Assistant Today:  Steffanie Dunn      Your RN Today:  Jackalyn Lombard  Your Provider for Today: Dr. Charlynn Court  Ph. [phone]          Increase Entresto to to 49-51 mg PO twice daily

## 2022-03-09 ENCOUNTER — Encounter: Admit: 2022-03-09 | Payer: MEDICARE | Attending: Internal Medicine | Primary: Family Medicine

## 2022-03-09 DIAGNOSIS — I480 Paroxysmal atrial fibrillation: Secondary | ICD-10-CM

## 2022-03-09 MED ORDER — SACUBITRIL-VALSARTAN 49-51 MG PO TABS
49-51 MG | ORAL_TABLET | Freq: Two times a day (BID) | ORAL | 3 refills | Status: AC
Start: 2022-03-09 — End: 2022-06-07

## 2022-03-09 MED ORDER — SACUBITRIL-VALSARTAN 24-26 MG PO TABS
24-26 | ORAL_TABLET | Freq: Two times a day (BID) | ORAL | 3 refills | Status: DC
Start: 2022-03-09 — End: 2022-03-09

## 2022-03-09 NOTE — Telephone Encounter (Signed)
Pt saw Dr Charlynn Court today.  Entresto increased to 49-51 mg BID.  Limited echo in 2 weeks.   Decision on ICD to follow.    *Pt currently has appt scheduled with Emilie on 3/6 with Echo scheduled 4/5. Should echo on 4/5 be cancelled?    Also, pt states she has grant for the [employer]. With the dose change, does anything need to be updated or changed for the grant?

## 2022-03-09 NOTE — Telephone Encounter (Signed)
Yes we can cancel ECHO on 4/5.     Nothing needs to be changed with the grant?

## 2022-03-09 NOTE — Addendum Note (Signed)
Addended by: Esperanza Heir A on: 03/09/2022 04:25 PM     Modules accepted: Orders

## 2022-03-09 NOTE — Progress Notes (Signed)
[facility] (EP)  [place] [address]  Dept: [phone]  Cardiac Electrophysiology: Follow up Note  Patient's demographics:  Date:   03/09/2022  Patient name:              Michelle Bright  Date of Birth:  1950-10-24  Sex:    female   MRN:   [id number]    Primary Care Physician:  Michaela Corner, MD    Cardiologist:  Fayne Mediate, MD    Reason for Follow up:  Atrial fibrillation, atrial flutter and cardiomyopathy.     Clinical Summary:  Michelle Bright is a 72 years old lady who was seen in the electrophysiology clinic in 12/13/2021 for the management of atrial fibrillation and flutter.  And we will proceed with left ventricular systolic dysfunction, the patient was referred for cardiac catheterization.  No evidence of epicardial coronary artery disease.  Currently undergoing GDMT optimization.    Feels better.  Less short of breath.  Is able to walk without having trouble breathing.  No chest pain, orthopnea, PND or syncope.  No palpitation or lower extremity swelling. Medical history: Paroxysmal atrial fibrillation dx 2016 (failed Flecainide), atrial flutter dx 10/2021, HFrEF dx 2021 (LVEF 25-30%), HTN, HPL, hx of CVA, and thyroidectomy (11/2003)    Review of systems:  Constitutional: Negative for chills and fever  HENT: Negative for congestion, sinus pressure, sneezing and sore throat.    Eyes: Negative for pain, discharge, redness and itching.   Respiratory: Negative for apnea, cough  Gastrointestinal: Negative for blood in stool, constipation, diarrhea   Endocrine: Negative for cold intolerance, heat intolerance, polydipsia.  Genitourinary: Negative for dysuria, enuresis, flank pain and hematuria.   Musculoskeletal: Negative for arthralgias, joint swelling and neck pain.   Neurological: Negative for numbness and headaches.   Psychiatric/Behavioral: Negative for agitation, confusion, decreased concentration and dysphoric mood.      Past Medical  History::  Past Medical History:   Diagnosis Date    Arthritis     Atrial fibrillation ([place])     CAD (coronary artery disease)     Afib     Cerebral artery occlusion with cerebral infarction ([place])     frequency of urine    Hypertension     Osteoporosis     Prolapse of female bladder, acquired     Thyroid disease     Removed        Past Surgical History:  Past Surgical History:   Procedure Laterality Date    BLADDER SURGERY  03/11/2021    pessary    CARDIAC PROCEDURE Left 12/22/2021    Left heart cath / coronary angiography performed by Veverly Fells, MD at [place]  12/2014    left femur    FRACTURE SURGERY      STIMULATOR SURGERY N/A 02/11/2020    STAGE 1 INTERSTIM performed by Kennith Maes, MD at [place] 02/25/2020    STAGE 2 STIMULATOR INSERTION performed by Kennith Maes, MD at [place]  2005    TRANSESOPHAGEAL ECHOCARDIOGRAM N/A 01/05/2022    TRANSESOPHAGEAL ECHOCARDIOGRAM performed by Veverly Fells, MD at [place] History:  No family history on file.     Social History:  Social History     Socioeconomic History    Marital status: Married  Spouse name: None    Number of children: 2    Years of education: None    Highest education level: None   Tobacco Use    Smoking status: Former     Current packs/day: 0.00     Types: Cigarettes     Quit date: 03/09/1992     Years since quitting: 30.0    Smokeless tobacco: Never   Vaping Use    Vaping Use: Never used   Substance and Sexual Activity    Alcohol use: Not Currently    Drug use: Never        Allergies:  No Known Allergies     Medications:  Current Outpatient Medications   Medication Sig Dispense Refill    potassium chloride (KLOR-CON) 10 MEQ extended release tablet Take 1 tablet by mouth every 48 hours 45 tablet 3    Coenzyme Q10 (COQ10) 200 MG CAPS Take by mouth Daily      metoprolol succinate (TOPROL XL) 25 MG extended release tablet Take 0.5 tablets by mouth daily  45 tablet 3    dapagliflozin (FARXIGA) 10 MG tablet Take 1 tablet by mouth every morning 90 tablet 3    sacubitril-valsartan (ENTRESTO) 24-26 MG per tablet Take 1 tablet by mouth 2 times daily 180 tablet 3    Multiple Vitamins-Minerals (THERAPEUTIC MULTIVITAMIN-MINERALS) tablet Take 1 tablet by mouth daily      metOLazone (ZAROXOLYN) 2.5 MG tablet 1 tablet Twice a week prn      Cholecalciferol (VITAMIN D3) 50 MCG (2000 UT) CAPS Take by mouth      D-Mannose 500 MG CAPS Take by mouth 2 times daily       Ascorbic Acid (VITAMIN C) 500 MG CHEW Take 500 mg by mouth daily      furosemide (LASIX) 20 MG tablet Take 2 tablets by mouth daily      nitroGLYCERIN (NITROSTAT) 0.4 MG SL tablet up to max of 3 total doses. If no relief after 1 dose, call 911. 25 tablet 3    acetaminophen (TYLENOL) 500 MG tablet Take 1 tablet by mouth every 6 hours as needed for Pain      warfarin (COUMADIN) 3 MG tablet Take 1 tablet by mouth daily      levothyroxine (SYNTHROID) 100 MCG tablet Take 125 mcg by mouth Daily       No current facility-administered medications for this visit.       Physical Examination:  Vitals:    03/09/22 0908   BP: (!) 141/56   Bright: 51   SpO2: 97%        Body mass index is 19.19 kg/m.     GENERAL: Alert and oriented. No distress.  EYES: No pallor or icterus.  ENT: No cyanosis.No thyromegaly or cervical LAP.  VESSELS: No jugular venous distension or carotid bruits.  HEART: Normal S1/S2. No murmur, rub or gallop.  LUNGS: Clear to auscultation.  ABDOMEN: Soft and non-tender.   EXTREMITIES: 2+ bilateral lower extremity edema.    NEUROLOGICAL: Grossly normal.     Laboratory And Diagnostic Data  I have personally reviewed and interpreted the results of the following diagnostic testing    Lab Results   Component Value Date    WBC 8.4 12/22/2021    HGB 15.7 12/22/2021    HCT 50.9 (H) 12/22/2021    PLT 157 12/22/2021    CHOL 156 04/11/2015    TRIG 107 04/11/2015    HDL 64 04/11/2015    ALT 12 04/11/2015  AST 17 04/11/2015     NA 141 02/18/2022    K 5.0 02/18/2022    CL 104 02/18/2022    CREATININE 0.7 02/18/2022    BUN 25 (H) 02/18/2022    CO2 29 02/18/2022    TSH 8.780 (H) 04/09/2015    INR 1.66 (H) 12/22/2021       Echocardiogram 11/09/2021: LVEF 25 to 30%, LVWT 1.1 cm, LAD/LAVI 4.5 cm.  Mild to moderate, and mild to moderate TR.  TTE 01/05/2022: LVEF 25-30%.   ECG 12/01/2021: Atrial flutter with controlled ventricular rate and left anterior fascicular block. ECG 03/09/2022: Sinus bradycardia  and IVCD (QRS 125 ms).   Lexiscan myocardial perfusion imaging  10/2021: Negative for ischemia.   Coronary angiography 11/2021: Patent coronary arteries.    Impression:  Typical atrial flutter and paroxysmal atrial fibrillation spontaneous conversion to sinus rhythm.   CHA2DS2-VASc score 6.  On warfarin, INR 1.8  Heart failure with reduced ejection fraction, EF 25 - 30%, NYHA class III.  On Entresto, metoprolol succinate and Farxiga.  Mild sinus bradycardia.  HTN and hx of CVA.     Assessment And Recommendations:  Michelle Bright is doing well.  Spontaneously converted to sinus rhythm.  Symptomatic improvement.  Increase Entresto 49/51 mg twice daily.  Limited echocardiogram in 2 weeks.  Decision regarding ICD implantation to follow.  Continue following with heart failure clinic.    Thank you for allowing me to participate in the care of your patient. Please call me if you have any questions.      **This report has been created using voice recognition software. It may contain minor errors which are inherent in voice recognition technology.**       Electronically signed by Tawanna Solo, MD, MRCP, [facility], [facility] on 03/09/2022 at 9:38 AM

## 2022-03-09 NOTE — Telephone Encounter (Signed)
Patient husband notified and verbalized understanding

## 2022-03-23 ENCOUNTER — Inpatient Hospital Stay: Admit: 2022-03-23 | Discharge: 2022-03-29 | Payer: MEDICARE | Attending: Internal Medicine | Primary: Family Medicine

## 2022-03-23 DIAGNOSIS — I480 Paroxysmal atrial fibrillation: Secondary | ICD-10-CM

## 2022-03-24 DIAGNOSIS — I480 Paroxysmal atrial fibrillation: Secondary | ICD-10-CM

## 2022-03-24 LAB — ECHO (TTE) COMPLETE (PRN CONTRAST/BUBBLE/STRAIN/3D)
AV Cusp Mmode: 1 cm
Ascending Aorta Index: 1.87 cm/m2
Ascending Aorta: 2.5 cm
Body Surface Area: 1.34 m2
EF 3D: 34 %
EF BP: 37 % — AB (ref 55–100)
Fractional Shortening 2D: 18 % (ref 28–44)
IVSd: 0.7 cm (ref 0.6–0.9)
LA Area 2C: 23.3 cm2
LA Area 4C: 24.8 cm2
LA Diameter: 4.9 cm
LA Major Axis: 5.8 cm
LA Minor Axis: 5.5 cm
LA Size Index: 3.66 cm/m2
LA Volume BP: 83 mL — AB (ref 22–52)
LA Volume Index BP: 62 ml/m2 — AB (ref 16–34)
LA Volume Index MOD A2C: 59 ml/m2 — AB (ref 16–34)
LA Volume Index MOD A4C: 63 ml/m2 — AB (ref 16–34)
LA Volume MOD A2C: 79 mL — AB (ref 22–52)
LA Volume MOD A4C: 84 mL — AB (ref 22–52)
LV EDV 3D: 97 mL
LV EDV A2C: 78 mL
LV EDV A4C: 78 mL
LV EDV Index 3D: 72 mL/m2
LV EDV Index A2C: 58 mL/m2
LV EDV Index A4C: 58 mL/m2
LV ESV 3D: 63 mL
LV ESV A2C: 49 mL
LV ESV A4C: 52 mL
LV ESV Index 3D: 47 mL/m2
LV ESV Index A2C: 37 mL/m2
LV ESV Index A4C: 39 mL/m2
LV Ejection Fraction A2C: 37 %
LV Ejection Fraction A4C: 33 %
LV Mass 2D Index: 82.7 g/m2 (ref 43–95)
LV Mass 2D: 110.8 g (ref 67–162)
LV Mass 3D: 154 g
LV Mass Index 3D: 114.9 g/m2
LV RWT Ratio: 0.29
LVIDd Index: 3.66 cm/m2
LVIDd: 4.9 cm (ref 3.9–5.3)
LVIDs Index: 2.99 cm/m2
LVIDs: 4 cm
LVPWd: 0.7 cm (ref 0.6–0.9)
RVIDd: 2.4 cm

## 2022-03-24 NOTE — Telephone Encounter (Signed)
Impression:  Typical atrial flutter and paroxysmal atrial fibrillation spontaneous conversion to sinus rhythm.   CHA2DS2-VASc score 6.  On warfarin, INR 1.8  Heart failure with reduced ejection fraction, EF 25 - 30%, NYHA class III.  On Entresto, metoprolol succinate and Farxiga.  Mild sinus bradycardia.  HTN and hx of CVA.      Assessment And Recommendations:  Michelle Bright is doing well.  Spontaneously converted to sinus rhythm.  Symptomatic improvement.  Increase Entresto 49/51 mg twice daily.  Limited echocardiogram in 2 weeks.  Decision regarding ICD implantation to follow.  Continue following with heart failure clinic.     Electronically signed by Tawanna Solo, MD, MRCP, [facility], [facility] on 03/09/2022 at 9:38 AM     Please see Echo results 02.28.2024  Please advise.

## 2022-03-24 NOTE — Telephone Encounter (Signed)
Spoke with patient's husband [name]  advised no need for ICD at this time. And confirmed apt with CHF   Also informed to get an apt scheduled with [facility] in the future.     Michelle Bright verbalized understanding

## 2022-03-24 NOTE — Telephone Encounter (Signed)
No need for ICD. Follow with her cardio and CHF clinic

## 2022-03-30 ENCOUNTER — Ambulatory Visit: Admit: 2022-03-30 | Discharge: 2022-03-30 | Payer: MEDICARE | Attending: Family | Primary: Family Medicine

## 2022-03-30 DIAGNOSIS — I428 Other cardiomyopathies: Secondary | ICD-10-CM

## 2022-03-30 MED ORDER — FUROSEMIDE 20 MG PO TABS
20 MG | ORAL_TABLET | Freq: Every day | ORAL | 3 refills | Status: DC
Start: 2022-03-30 — End: 2023-07-20

## 2022-03-30 MED ORDER — SPIRONOLACTONE 25 MG PO TABS
25 | ORAL_TABLET | Freq: Every day | ORAL | 3 refills | 30.00000 days | Status: DC
Start: 2022-03-30 — End: 2023-10-26

## 2022-03-30 NOTE — Patient Instructions (Addendum)
You may receive a survey regarding the care you received during your visit.  Your input is valuable to [place].  We encourage you to complete and return your survey.  We hope you will choose [place] in the future for your healthcare needs.    Your nurses today were [place] and [employer].  Office hours:   Mon-Thurs 8-4:30  Friday 8-12  Phone: [phone]    Continue:  Continue current medications  Daily weights and record  Fluid restriction of 2 Liters per day  Limit sodium in diet to around 1500-2000 mg/day  Monitor BP  Activity as tolerated     Call the [facility] for any of the following symptoms:   Weight gain of 2-3 pounds in 1 day or 5 pounds in 1 week  Increased shortness of breath  Shortness of breath while laying down  Cough  Chest pain  Swelling in feet, ankles or legs  Bloating in abdomen  Fatigue        Stop you potassium    Decrease your Lasix to '20mg'$  daily    Start aldactone '25mg'$  daily     Continue diet/fluid adherence  Continue daily wts.  F/U w/ Cardiology  F/U in clinic in 3 month

## 2022-03-30 NOTE — Progress Notes (Signed)
Heart Failure Clinic       Visit Date: 03/30/2022  Cardiologist:  Dr. Harrietta Guardian - Michelle Bright   Primary Care Physician: Dr. Michaela Corner, MD    Michelle Bright is a 72 y.o. female who presents today for:  Chief Complaint   Patient presents with    Follow-up       HPI:     TYPE HF: HFimpEF 35-40% (25-30% 2023) (58% 2018)  Cause: improved nonischemic (newly found)   Device: none  HX: Pafib (2016), CVA  Dry Wt:  94 on 12/28/21, 92 on 01/28/22, 95 on 03/30/22      Michelle Bright is a 72 y.o. female who presents to the office for a f/u patient visit in the heart failure clinic.    Concerns today: 8 week f/u. Pt did see Dr. Charlynn Court and her Leticia Clas was increased with 2 week ECHO to decide on ICD. Her EF did improved to 35-40%. Since her last visit she has noted improved appetite. Denies SOB, leg swelling, bloating, and orthopnea.     Visit on 01/28/22:  8 week f/u. Pt is s/p her TEE and her 48hr holter monitor. She is thus far tolerating her medications. She is getting repeat labs after her Farxiga at the end of the month. Her TEE shows moderate MR with continued low EF. Her monitor shows sinus brady with some a-flutter - f/u w/ [facility] 2/14. Today she notes that since starting both the Entresto and [place] she has noted less fatigue and less SOB. She denies leg swelling or orthopnea. Following her diet.     Visit on 12/28/21: here today as a new pt referred by Dr. Charlynn Court for management of her newly found systolic HF. Pt started with a new onset of Afib in 2016 found during a surgery. She has never had a cardioversion or an ablation done. She was started on an Eliquis but did not tolerate it so it was stopped. She then suffered a CVA in 2018 with left sided deficits that have since resolved, she was started on coumadin at this time. Dr. Jake Seats ordered an ECHO in October d/t episodes of HTN, return of swelling, and SOB and was found to have an EF of 25-30%. Prior to the ECHO she was started on metolazone with improvement  of her symptoms. She has been on lasix for yrs with good urination. She also had noted orthopnea prior to her metolazone use. She does follow a low Na/fluid diet.       Patient follows:      Hospitalization:        Activity: ADLs without limitation   Diet: Educated and following    Patient has:  Chest Pain: no  SOB: resolved   Orthopnea/PND: resolved OSA: no  Edema: resolved  Fatigue: resolved   Abdominal bloating: no  Cough: no  Appetite: good      Past Medical History:   Diagnosis Date    Arthritis     Atrial fibrillation (HCC)     CAD (coronary artery disease)     Afib     Cerebral artery occlusion with cerebral infarction ([place])     frequency of urine    Hypertension     Osteoporosis     Prolapse of female bladder, acquired     Thyroid disease     Removed      Past Surgical History:   Procedure Laterality Date    BLADDER SURGERY  03/11/2021    pessary  CARDIAC PROCEDURE Left 12/22/2021    Left heart cath / coronary angiography performed by Veverly Fells, MD at [place]  12/2014    left femur    FRACTURE SURGERY      STIMULATOR SURGERY N/A 02/11/2020    STAGE 1 INTERSTIM performed by Kennith Maes, MD at [place] 02/25/2020    STAGE 2 STIMULATOR INSERTION performed by Kennith Maes, MD at [place]  2005    TRANSESOPHAGEAL ECHOCARDIOGRAM N/A 01/05/2022    TRANSESOPHAGEAL ECHOCARDIOGRAM performed by Veverly Fells, MD at [place] reviewed. No pertinent family history.  Social History     Tobacco Use    Smoking status: Former     Current packs/day: 0.00     Types: Cigarettes     Quit date: 03/09/1992     Years since quitting: 30.0    Smokeless tobacco: Never   Substance Use Topics    Alcohol use: Not Currently     Current Outpatient Medications   Medication Sig Dispense Refill    spironolactone (ALDACTONE) 25 MG tablet Take 1 tablet by mouth daily 90 tablet 3    furosemide (LASIX) 20 MG tablet Take 1 tablet by mouth daily  90 tablet 3    sacubitril-valsartan (ENTRESTO) 49-51 MG per tablet Take 1 tablet by mouth 2 times daily 60 tablet 3    Coenzyme Q10 (COQ10) 200 MG CAPS Take by mouth Daily      metoprolol succinate (TOPROL XL) 25 MG extended release tablet Take 0.5 tablets by mouth daily 45 tablet 3    dapagliflozin (FARXIGA) 10 MG tablet Take 1 tablet by mouth every morning 90 tablet 3    Multiple Vitamins-Minerals (THERAPEUTIC MULTIVITAMIN-MINERALS) tablet Take 1 tablet by mouth daily      metOLazone (ZAROXOLYN) 2.5 MG tablet 1 tablet Twice a week prn      Cholecalciferol (VITAMIN D3) 50 MCG (2000 UT) CAPS Take by mouth      D-Mannose 500 MG CAPS Take by mouth 2 times daily       Ascorbic Acid (VITAMIN C) 500 MG CHEW Take 500 mg by mouth daily      nitroGLYCERIN (NITROSTAT) 0.4 MG SL tablet up to max of 3 total doses. If no relief after 1 dose, call 911. 25 tablet 3    acetaminophen (TYLENOL) 500 MG tablet Take 1 tablet by mouth every 6 hours as needed for Pain      warfarin (COUMADIN) 3 MG tablet Take 1 tablet by mouth daily      levothyroxine (SYNTHROID) 100 MCG tablet Take 125 mcg by mouth Daily       No current facility-administered medications for this visit.     No Known Allergies    SUBJECTIVE:   Review of Systems   Constitutional:  Negative for activity change, appetite change and fatigue.   Respiratory:  Negative for cough, shortness of breath and wheezing.    Cardiovascular:  Negative for chest pain, palpitations and leg swelling.   Gastrointestinal:  Negative for abdominal distention and abdominal pain.   Musculoskeletal:  Negative for gait problem.   Neurological:  Negative for weakness, light-headedness and headaches.   Psychiatric/Behavioral:  Negative for sleep disturbance.        OBJECTIVE:   Today's Vitals:  BP (!) 146/64   Pulse (!) 49   Resp 18   Ht 1.499  m ('4\' 11"'$ )   Wt 43.2 kg (95 lb 3.2 oz)   SpO2 96%   BMI 19.23 kg/m     Physical Exam  Vitals reviewed.   Constitutional:       General: She is not in  acute distress.     Appearance: Normal appearance. She is well-developed. She is not diaphoretic.   HENT:      Head: Normocephalic and atraumatic.   Eyes:      Conjunctiva/sclera: Conjunctivae normal.   Cardiovascular:      Rate and Rhythm: Tachycardia present. Rhythm irregular.      Heart sounds: Murmur (grade 2-3) heard.   Pulmonary:      Effort: Pulmonary effort is normal. No respiratory distress.      Breath sounds: Normal breath sounds. No wheezing, rhonchi or rales.   Abdominal:      General: Bowel sounds are normal. There is no distension.      Palpations: Abdomen is soft.      Tenderness: There is no abdominal tenderness.   Musculoskeletal:         General: Normal range of motion.      Cervical back: Normal range of motion and neck supple.      Right lower leg: No edema.      Left lower leg: No edema.   Skin:     General: Skin is warm and dry.      Capillary Refill: Capillary refill takes less than 2 seconds.   Neurological:      Mental Status: She is alert and oriented to person, place, and time.      Coordination: Coordination normal.   Psychiatric:         Behavior: Behavior normal.         Wt Readings from Last 3 Encounters:   03/30/22 43.2 kg (95 lb 3.2 oz)   03/23/22 43.1 kg (95 lb)   03/09/22 43.1 kg (95 lb)     BP Readings from Last 3 Encounters:   03/30/22 (!) 146/64   03/23/22 (!) 141/56   03/09/22 (!) 141/56     Pulse Readings from Last 3 Encounters:   03/30/22 (!) 49   03/09/22 51   01/28/22 (!) 46     Body mass index is 19.23 kg/m.    ECHO:     Echo Findings 02/2022    Left Ventricle Moderately reduced left ventricular systolic function with a visually estimated EF of 35 - 40%. Left ventricle size is normal. Normal wall thickness. Moderate global hypokinesis present.   Left Atrium Left atrial volume index is severely increased (>48 mL/m2).   Right Ventricle Right ventricle size is normal. Normal wall thickness.   Right Atrium Right atrium is mildly dilated.   IVC/Hepatic Veins IVC diameter is  less than or equal to 21 mm and decreases greater than 50% during inspiration; therefore the estimated right atrial pressure is normal (~3 mmHg). IVC size is normal.   Pericardium The pericardium is normal. No pericardial effusion.         Interpretation Summary: 12/2021         Left Ventricle: Severely reduced left ventricular systolic function with a visually estimated EF of 25 - 30%. Left ventricle size is normal. Normal wall thickness. Severe global hypokinesis present.    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 Mildly calcified leaflet. Moderate regurgitation.    Tricuspid Valve: Moderate regurgitation.    Left Atrium: Left atrium is moderately dilated. Normal sized appendage. No  left atrial appendage thrombus noted. No left atrial appendage mass noted.    Interatrial Septum: No interatrial shunt visualized with color Doppler. Agitated saline study was negative with and without provocation.    Right Atrium: Right atrium is mildly dilated.    Image quality is adequate.             CATH/STRESS:     Patent coronaries 2023      Results reviewed:  BNP: No results found for: "BNP"  CBC:   Lab Results   Component Value Date/Time    WBC 8.4 12/22/2021 12:04 PM    RBC 4.98 12/22/2021 12:04 PM    RBC 17 01/21/2020 09:08 AM    HGB 15.7 12/22/2021 12:04 PM    HCT 50.9 12/22/2021 12:04 PM    PLT 157 12/22/2021 12:04 PM     CMP:    Lab Results   Component Value Date/Time    NA 141 02/18/2022 07:35 AM    K 5.0 02/18/2022 07:35 AM    CL 104 02/18/2022 07:35 AM    CO2 29 02/18/2022 07:35 AM    BUN 25 02/18/2022 07:35 AM    CREATININE 0.7 02/18/2022 07:35 AM    LABGLOM 86 02/18/2022 07:35 AM    GLUCOSE 133 02/18/2022 07:35 AM    CALCIUM 9.3 02/18/2022 07:35 AM     Hepatic Function Panel:    Lab Results   Component Value Date/Time    ALKPHOS 85 04/11/2015 05:15 AM    ALT 12 04/11/2015 05:15 AM    AST 17 04/11/2015 05:15 AM    PROT 7.2 04/11/2015 05:15 AM    BILITOT Negative 01/21/2020 09:08 AM    BILIDIR  <0.2 04/10/2015 02:03 PM    LABALBU 3.9 04/11/2015 05:15 AM     Magnesium:    Lab Results   Component Value Date/Time    MG 1.8 04/09/2015 02:30 PM     PT/INR:    Lab Results   Component Value Date/Time    INR 1.66 12/22/2021 12:04 PM     Lipids:    Lab Results   Component Value Date/Time    TRIG 107 04/11/2015 05:15 AM    HDL 64 04/11/2015 05:15 AM    LDLCALC 71 04/11/2015 05:15 AM       ASSESSMENT AND PLAN:   The patient's condition/symptoms are stable        Diagnosis Orders   1. Nonischemic cardiomyopathy (HCC)  spironolactone (ALDACTONE) 25 MG tablet    Basic Metabolic Panel    Magnesium    Brain Natriuretic Peptide      2. Chronic systolic congestive heart failure, NYHA class 2, Stage C (HCC)  spironolactone (ALDACTONE) 25 MG tablet    furosemide (LASIX) 20 MG tablet    Basic Metabolic Panel    Magnesium    Brain Natriuretic Peptide      3. Paroxysmal A-fib ([place])        4. At risk for fluid volume overload        5. Mitral valve insufficiency, unspecified etiology        6. Primary hypertension          Continue:  GDMT:   ACE/ARB/ARNI - Entresto 49/51 BID   BB - toprol 12.5 daily   Diuretic - Lasix 20 daily  AA - Aldactone '25mg'$  daily   SGLT2 -  Farxiga 10 daily   Vasodilator -   Other - coumadin, synthroid      HFimpEF  35-40% 2024 (  25-30% 2023)  Nonischemic - valvular vs chronic ? - (son did pass from cardiomyopathy)   MR - mod  TR - mod  Paroxsymal atrial fibrillation and typical a-flutter - spontaneous concersion to NSR - on coumadin - follows Dr. Charlynn Court  Bradycardia - decrease toprol to  12.5 daily   HTN - BP stable at home - will continue to monitor - adding aldactone today    Stable, no fluid on exam today. Feeling better on medications. Adding aldactone today, decreasing lasix and stopping KCL. BMP in two weeks. 3 month f/u. Consider complete ECHO at next visit    GDMT: adding aldactone today     Lab reviewed - K 4.2 Cr 0.6 Hgb 15.7  ECHO 2023: 2-3+ MR, 3-4+ TR, +1 AI, RVSP 50, mild LA dilation  ECHO  2024 - limited - Severe LA dilation, mild RA dilation,   TEE 12/2021: Mod MR, Mod TR, bi-atrial dilation, RA pressure 3, no IVC dilation   CATH 2023: patent coronaries LVEDP 15    Stop you potassium    Decrease your Lasix to '20mg'$  daily    Start aldactone '25mg'$  daily     Continue diet/fluid adherence  Continue daily wts.  F/U w/ Cardiology  F/U in clinic in 3 month      Tolerating above noted HF meds, no ill side effects noted. Will continue to monitor kidney function and electrolytes. Will optimize as tolerated.   Pt is compliant w/ medications.    Total visit time of  minutes has been spent with patient on education of symptoms, management, medication, and plan of care; as well as review of chart: labs, ECHO, radiology reports, etc.   I personally spent more then 50% of the appt time face to face with the patient.  Daily weights  Fluid restriction of 2 Liters per day  Limit sodium in diet to around 1500-2000 mg/day  Monitor BP  Activity as tolerated     Patient was instructed to call the [facility] for any changes in symptoms as noted in AVS.      Return in about 3 months (around 06/30/2022). or sooner if needed     Patient given educational materials - see patient instructions.   We discussed the importance of weighing oneself and recording daily. We also discussed the importance of a low sodium diet, higher sodium foods to avoid and better low sodium food options.   Patient verbalizes understanding of plan of care using teach back method, and is agreeable to the treatment plan.       Electronically signed by Juluis Pitch, APRN - CNP on 03/30/2022 at 12:04 PM

## 2022-04-13 LAB — BASIC METABOLIC PANEL
BUN: 28 mg/dL
CO2: 26 mmol/L
Calcium: 9.4 mg/dL
Chloride: 104 mmol/L
Creatinine: 0.7
Est, Glomerular Filtration Rate: 86
Glucose: 101 mg/dL
Potassium: 4.3 mmol/L
Sodium: 141 mmol/L

## 2022-04-13 LAB — MAGNESIUM: Magnesium: 1.7 mg/dL

## 2022-04-14 NOTE — Telephone Encounter (Signed)
Called to patient   UTLM

## 2022-04-14 NOTE — Telephone Encounter (Signed)
-----   Message from Juluis Pitch, APRN - CNP sent at 04/14/2022  1:26 PM EDT -----  Labs reviewed - no changes

## 2022-04-14 NOTE — Telephone Encounter (Signed)
Pt husband called back, voiced understanding.

## 2022-04-29 ENCOUNTER — Ambulatory Visit: Payer: MEDICARE | Primary: Family Medicine

## 2022-05-26 ENCOUNTER — Encounter: Admit: 2022-05-26 | Discharge: 2022-05-26 | Payer: MEDICARE | Attending: Registered Nurse | Primary: Family Medicine

## 2022-05-26 DIAGNOSIS — N390 Urinary tract infection, site not specified: Secondary | ICD-10-CM

## 2022-05-26 LAB — POCT URINALYSIS DIPSTICK W/O MICROSCOPE (AUTO)
Bilirubin, UA: NEGATIVE
Glucose, UA POC: 500
Ketones, UA: NEGATIVE
Nitrite, UA: NEGATIVE
Protein, UA POC: 100
Spec Grav, UA: 1.015
Urobilinogen, UA: 0.2
pH, UA: 5

## 2022-05-26 LAB — POST VOID RESIDUAL (PVR): post void residual: 10 ml

## 2022-05-26 NOTE — Progress Notes (Signed)
[facility]  [address] [name]  [place] [address]  Dept: [phone]  Loc: [phone]    Visit Date: 05/26/2022        HPI:     Michelle Bright is a 72 y.o. female who presents today for:  Chief Complaint   Patient presents with    Urinary Tract Infection     recurrent    Urinary Frequency       HPI    Pt seen in follow up for recurrent UTI.      Pt has a hx of recurrent UTIs previously treated with D mannose.  Pt previously tried oxybutynin, trospium, and Myrbetriq for her OAB without improvement.  Underwent interstim stage I by Dr. Welton Flakes 02/11/20 and stage II placement 02/25/20.        Had episode of severe UTI in October with urinary retention and required foley insertion for 6 days.  Subsequent PVR check was acceptable after removal.       Reports she was seen in outside facility ER 2/3 for dysuria, suprapubic pain, and diagnosed with UTI.  ER physician reported to her and husband that she had a prolapsed bladder.  Referred to gynecology and fitted with a pessary.  Reports she is tolerating the pessary well and urinary retention is resolved.  No further infections.       Doing well today.  Denies fever/chills, abdominal or new back pain, dysuria, frequency, urgency, trouble emptying.       Accompanied to appt today by her husband [name].        Current Outpatient Medications   Medication Sig Dispense Refill    spironolactone (ALDACTONE) 25 MG tablet Take 1 tablet by mouth daily 90 tablet 3    furosemide (LASIX) 20 MG tablet Take 1 tablet by mouth daily 90 tablet 3    sacubitril-valsartan (ENTRESTO) 49-51 MG per tablet Take 1 tablet by mouth 2 times daily 60 tablet 3    Coenzyme Q10 (COQ10) 200 MG CAPS Take by mouth Daily      metoprolol succinate (TOPROL XL) 25 MG extended release tablet Take 0.5 tablets by mouth daily 45 tablet 3    dapagliflozin (FARXIGA) 10 MG tablet Take 1 tablet by mouth every morning 90 tablet 3    Multiple Vitamins-Minerals  (THERAPEUTIC MULTIVITAMIN-MINERALS) tablet Take 1 tablet by mouth daily      Cholecalciferol (VITAMIN D3) 50 MCG (2000 UT) CAPS Take by mouth      D-Mannose 500 MG CAPS Take by mouth 2 times daily       Ascorbic Acid (VITAMIN C) 500 MG CHEW Take 500 mg by mouth daily      acetaminophen (TYLENOL) 500 MG tablet Take 1 tablet by mouth every 6 hours as needed for Pain      warfarin (COUMADIN) 3 MG tablet Take 1 tablet by mouth daily      levothyroxine (SYNTHROID) 100 MCG tablet Take 125 mcg by mouth Daily      metOLazone (ZAROXOLYN) 2.5 MG tablet 1 tablet Twice a week prn (Patient not taking: Reported on 05/26/2022)      nitroGLYCERIN (NITROSTAT) 0.4 MG SL tablet up to max of 3 total doses. If no relief after 1 dose, call 911. (Patient not taking: Reported on 05/26/2022) 25 tablet 3     No current facility-administered medications for this visit.       Past Medical History  Sarine  has a past  medical history of Arthritis, Atrial fibrillation (HCC), CAD (coronary artery disease), Cerebral artery occlusion with cerebral infarction (HCC), Hypertension, Osteoporosis, Prolapse of female bladder, acquired, and Thyroid disease.    Past Surgical History  The patient  has a past surgical history that includes Femur Surgery (12/2014); Thyroid surgery (2005); fracture surgery; Stimulator Surgery (N/A, 02/11/2020); Stimulator Surgery (N/A, 02/25/2020); Bladder surgery (03/11/2021); Cardiac procedure (Left, 12/22/2021); and transesophageal echocardiogram (N/A, 01/05/2022).    Family History  This patient's family history is not on file.    Social History  Michelle Bright  reports that she quit smoking about 30 years ago. Her smoking use included cigarettes. She has never used smokeless tobacco. She reports that she does not currently use alcohol. She reports that she does not use drugs.      Subjective:      Review of Systems   Constitutional:  Negative for activity change, appetite change, chills, diaphoresis, fatigue, fever and unexpected  weight change.   Gastrointestinal:  Negative for abdominal pain, nausea and vomiting.   Genitourinary:  Negative for decreased urine volume, difficulty urinating, dysuria, flank pain, frequency, hematuria and urgency.   Musculoskeletal:  Negative for back pain.       Objective:   BP 118/64   Ht 1.499 m (4\' 11" )   Wt 44.8 kg (98 lb 12.8 oz)   BMI 19.96 kg/m     Physical Exam  Vitals reviewed.   Constitutional:       General: She is not in acute distress.     Appearance: Normal appearance. She is well-developed. She is not ill-appearing or diaphoretic.   HENT:      Head: Normocephalic and atraumatic.      Right Ear: External ear normal.      Left Ear: External ear normal.      Nose: Nose normal.      Mouth/Throat:      Mouth: Mucous membranes are moist.   Eyes:      General: No scleral icterus.        Right eye: No discharge.         Left eye: No discharge.   Neck:      Vascular: No JVD.      Trachea: No tracheal deviation.   Cardiovascular:      Rate and Rhythm: Normal rate and regular rhythm.   Pulmonary:      Effort: Pulmonary effort is normal. No respiratory distress.   Abdominal:      General: There is no distension.      Tenderness: There is no abdominal tenderness. There is no right CVA tenderness or left CVA tenderness.   Musculoskeletal:         General: No tenderness. Normal range of motion.   Skin:     General: Skin is warm and dry.   Neurological:      Mental Status: She is alert and oriented to person, place, and time. Mental status is at baseline.   Psychiatric:         Mood and Affect: Mood normal.         Behavior: Behavior normal.         Thought Content: Thought content normal.         POC  Results for POC orders placed in visit on 05/26/22   POCT Urinalysis No Micro (Auto)   Result Value Ref Range    Color, UA Yellow     Clarity, UA Clear     Glucose, UA POC  500     Bilirubin, UA Negative     Ketones, UA Negative     Spec Grav, UA 1.015     Blood, UA POC Small     pH, UA 5.0     Protein, UA POC  100     Urobilinogen, UA 0.2     Leukocytes, UA Small     Nitrite, UA Negative    poct post void residual   Result Value Ref Range    post void residual 10 ml       Patients recent PSA values are as follows  No results found for: "PSA", "PSADIA"     Recent BUN/Creatinine:  Lab Results   Component Value Date/Time    BUN 28 04/13/2022 12:16 PM    CREATININE 0.7 04/13/2022 12:16 PM       Assessment:   Urinary retention, resolved  OAB  Hx recurrent UTIs  POP s/p pessary    Plan:     Tanishka continues to do well with interstim device.  No further infections since pessary placed and urinary retention improved.  Very happy with control of symptoms at this time.       Continue D mannose. If future trouble with infections consider Hiprex.     F/u in 1 year with PVR.  Call if worsening in symptoms prior.

## 2022-06-07 MED ORDER — SACUBITRIL-VALSARTAN 49-51 MG PO TABS
49-51 | ORAL_TABLET | Freq: Two times a day (BID) | ORAL | 0 refills | Status: DC
Start: 2022-06-07 — End: 2022-07-04

## 2022-06-07 NOTE — Telephone Encounter (Signed)
Needs refill Entresto. Requesting 90 day supply

## 2022-06-08 ENCOUNTER — Encounter: Payer: MEDICARE | Attending: Registered Nurse | Primary: Family Medicine

## 2022-06-28 IMAGING — MR MRI ANKLE LT WO CONTRAST
6 series · 38 of 40 positions shown · non-contrast
Comparison: None available.

Images Obtained from [facility]
INDICATION: Achilles tendinitis, left leg
TECHNIQUE: Multiplanar, multisequence imaging of the left ankle/hindfoot was performed without  contrast.

[Series 2: t1_axial · axial · 4.0mm · 0.42mm/px · z∈[-72,+64]mm · 7 of 32 slices shown]
[im 1/32]
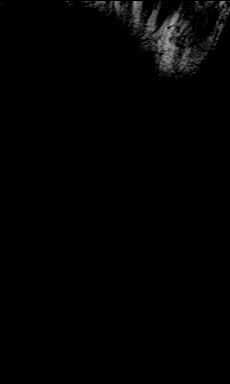
[im 6/32]
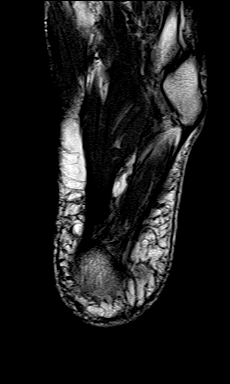
[im 11/32]
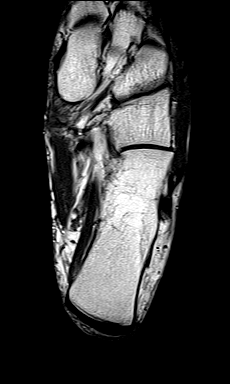
[im 16/32]
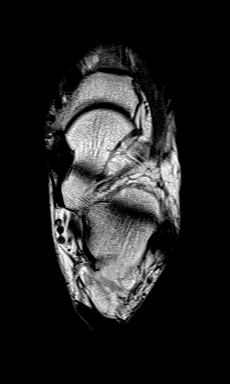
[im 21/32]
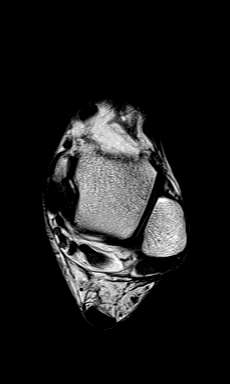
[im 26/32]
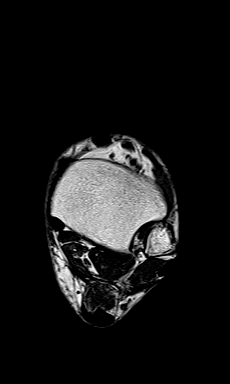
[im 32/32]
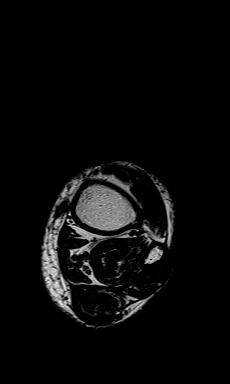

[Series 3: t2_axial_fs · axial · 4.0mm · 0.50mm/px · z∈[-72,+64]mm · 7 of 32 slices shown]
[im 1/32]
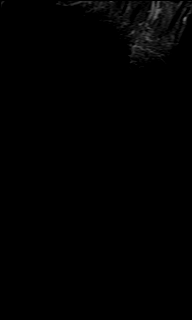
[im 6/32]
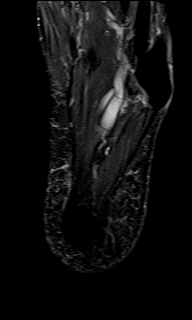
[im 11/32]
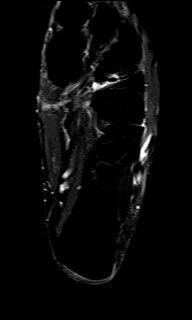
[im 16/32]
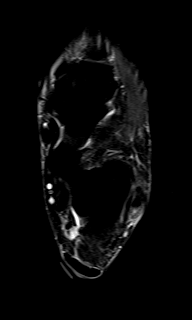
[im 21/32]
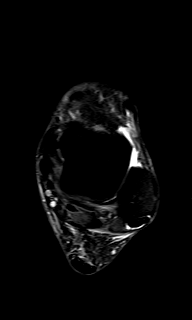
[im 26/32]
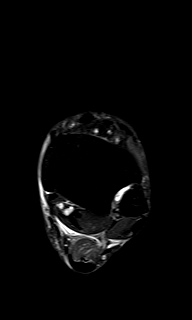
[im 32/32]
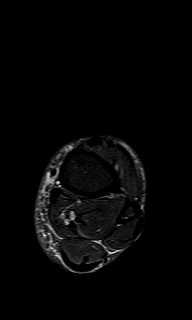

[Series 4: t1_sag · sagittal · 4.0mm · 0.42mm/px · 4 of 17 slices shown]
[im 1/17]
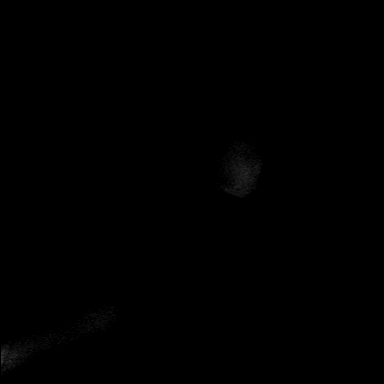
[im 6/17]
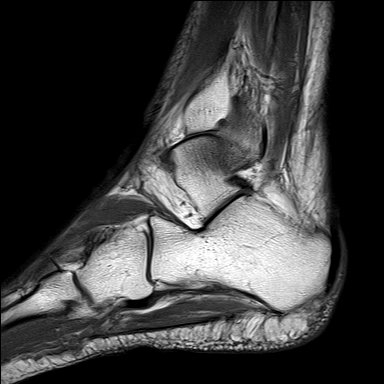
[im 11/17]
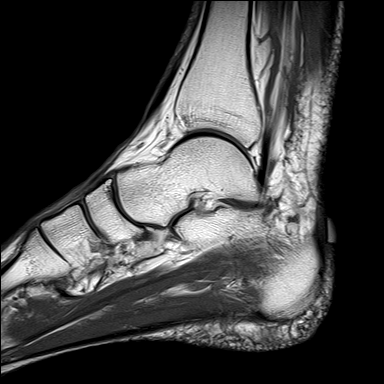
[im 17/17]
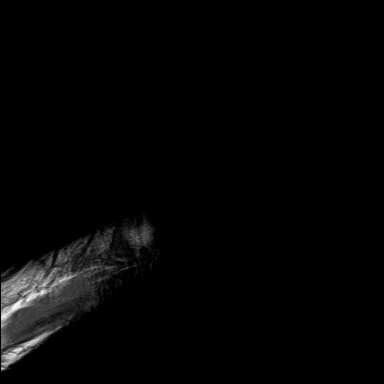

[Series 5: t2_sag_fs · sagittal · 4.0mm · 0.50mm/px · 4 of 17 slices shown]
[im 1/17]
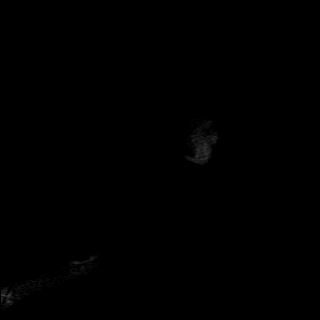
[im 6/17]
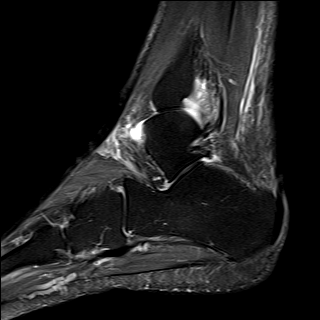
[im 11/17]
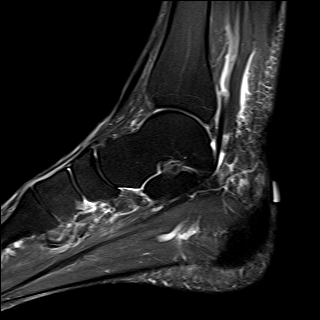
[im 17/17]
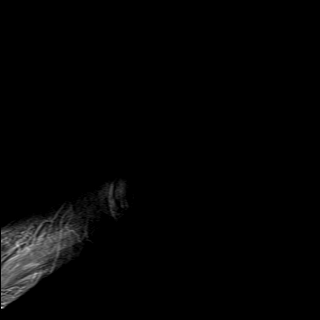

[Series 6: t2_cor_fs · coronal · 4.0mm · 0.50mm/px · 7 of 34 slices shown]
[im 1/34]
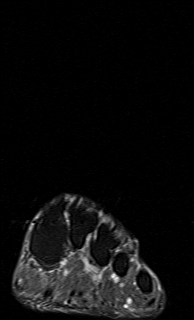
[im 6/34]
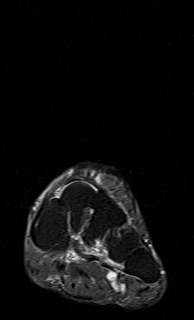
[im 12/34]
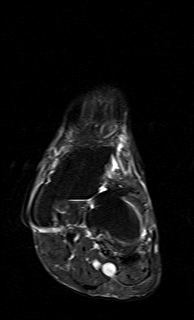
[im 17/34]
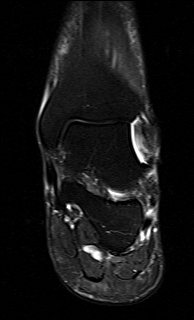
[im 23/34]
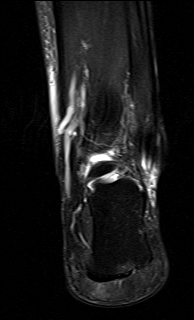
[im 28/34]
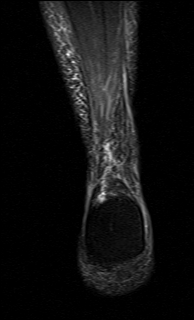
[im 34/34]
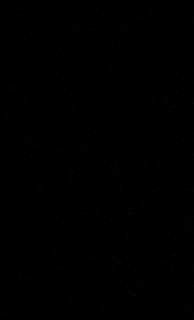

[Series 10: pd_axial_obl_fs · axial · 3.0mm · 0.28mm/px · z∈[-38,+33]mm · 9 of 51 slices shown]
[im 1/51]
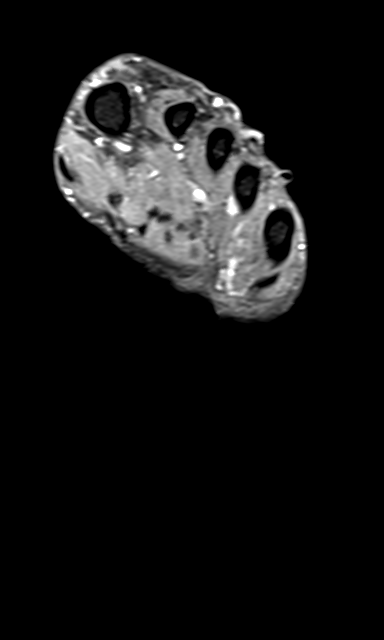
[im 6/51]
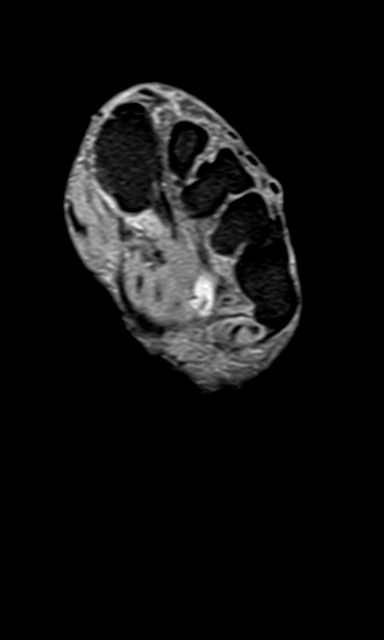
[im 11/51]
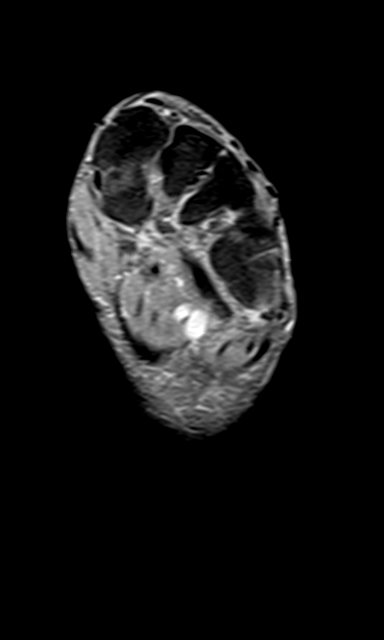
[im 16/51]
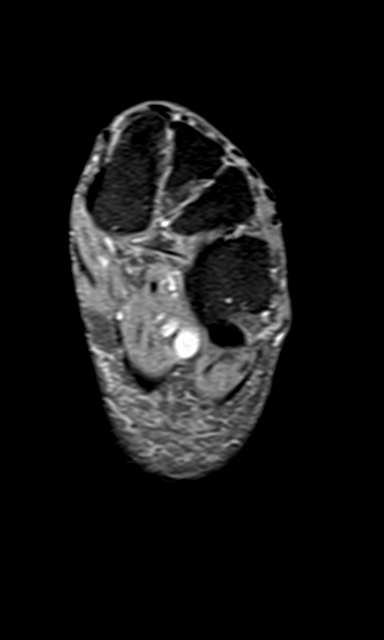
[im 21/51]
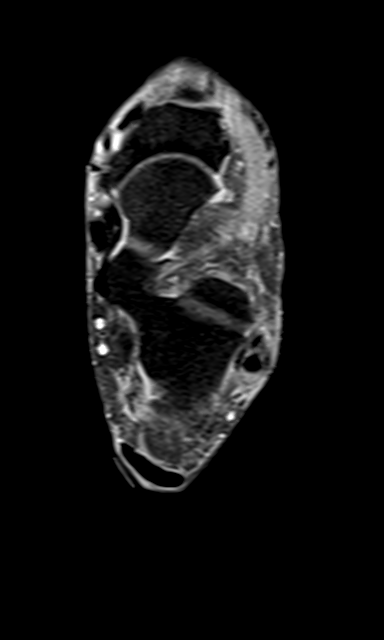
[im 31/51]
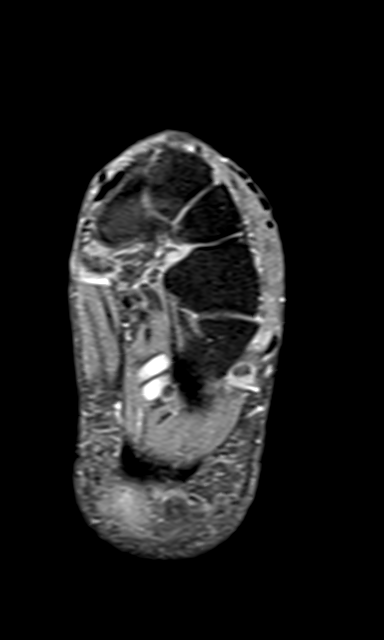
[im 36/51]
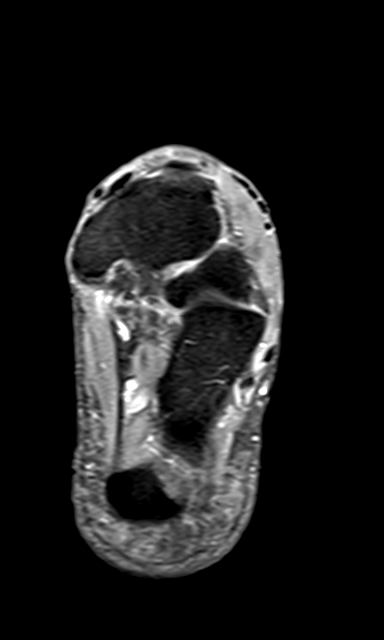
[im 41/51]
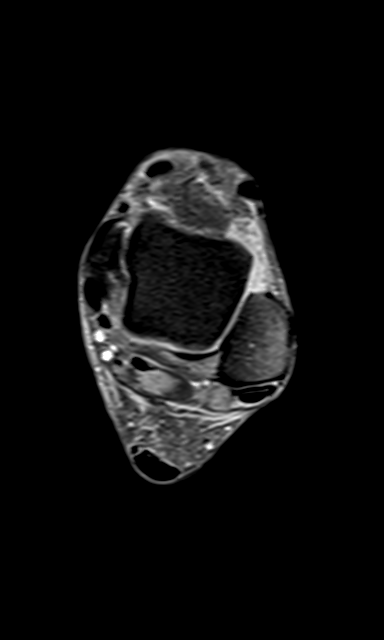
[im 51/51]
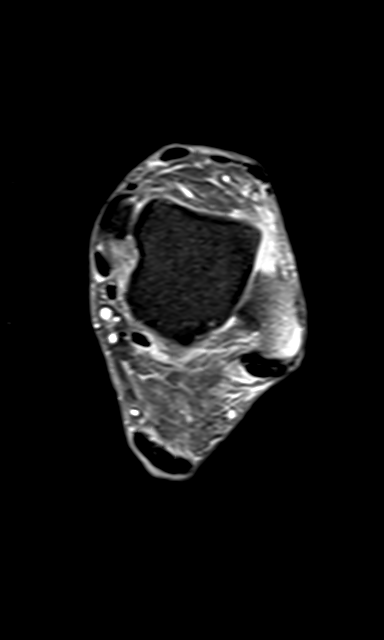

[38 of 40 positions shown; findings below may reference images not displayed]

FINDINGS: OSSEOUS: No acute fracture, avascular necrosis or aggressive osseous lesion.
TIBIOTALAR JOINT:
Articular cartilage: Intact. No talar dome osteochondral lesion.
Tibiotalar joint effusion and intra-articular bodies: None.
LIGAMENTS:
Anterior talofibular ligament: Intact.
Calcaneofibular ligament: Intact.
Posterior talofibular ligament: Intact.
Anterior and posterior syndesmotic ligaments: Intact.
Deep fibers the deltoid ligament: Intact.
Spring ligament: Intact.
TENDONS:
Peroneus brevis tendon: Longitudinal split tear extending from the retromalleolar groove into the inframalleolar region. Mild associated tenosynovitis.
Peroneus longus tendon: Intact with mild tenosynovitis.
Tibialis posterior tendon: Intact.
Flexor digitorum longus: Intact.
Flexor hallucis longus: Intact.
Anterior ankle tendons: Intact.
Achilles tendon: Trace distal Achilles tendinosis with mild associated paratenonitis. Small volume fluid within the retrocalcaneal bursa.
PLANTAR APONEUROSIS: Unremarkable.
OTHER:
Sinus tarsi: Intact.
Tarsal tunnel: The flexor digitorum accessorius longus muscle is present which mildly crowd the tarsal tunnel.
Acute muscle injury: No acute muscle injury.
Focal fatty infiltration of the muscles: None.
IMPRESSION: 1.  Trace distal Achilles tendinosis, with mild associated paratenonitis and mild retrocalcaneal bursitis.
2.  Longitudinal split tear of the peroneus brevis tendon, extending from the retromalleolar groove into the inframalleolar region. Mild associated tenosynovitis.
3.  Flexor digitorum accessorius longus muscle, which mildly crowds the tarsal tunnel.

## 2022-07-04 MED ORDER — ENTRESTO 49-51 MG PO TABS
49-51 MG | ORAL_TABLET | Freq: Two times a day (BID) | ORAL | 0 refills | Status: AC
Start: 2022-07-04 — End: 2022-09-13

## 2022-07-05 ENCOUNTER — Encounter: Admit: 2022-07-05 | Discharge: 2022-07-05 | Payer: MEDICARE | Attending: Family | Primary: Family Medicine

## 2022-07-05 DIAGNOSIS — I5022 Chronic systolic (congestive) heart failure: Secondary | ICD-10-CM

## 2022-07-05 NOTE — Progress Notes (Signed)
Heart Failure Clinic       Visit Date: 07/05/2022  Cardiologist:  Dr. Geradine Girt - [facility] Beam   Primary Care Physician: Dr. Elton Sin, MD    Michelle Bright is a 72 y.o. 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 female who presents to the office for a f/u patient visit in the heart failure clinic.    Concerns today: here today for her 3 month f/u. Weight is stable. Urinating well, has not needed her metolazone. In a-fib upon arrival today and is asymptomatic. Denies leg swelling, bloating, orthopnea, or PND. Following diet.       Visit on 03/30/22: 8 week f/u. Pt did see Dr. Corrinne Eagle and her [name] was increased with 2 week ECHO to decide on ICD. Her EF did improved to 35-40%. Since her last visit she has noted improved appetite. Denies SOB, leg swelling, bloating, and orthopnea.     Visit on 01/28/22:  8 week f/u. Pt is s/p her TEE and her 48hr holter monitor. She is thus far tolerating her medications. She is getting repeat labs after her Farxiga at the end of the month. Her TEE shows moderate MR with continued low EF. Her monitor shows sinus brady with some a-flutter - f/u w/ [facility] 2/14. Today she notes that since starting both the Entresto and [place] she has noted less fatigue and less SOB. She denies leg swelling or orthopnea. Following her diet.     Visit on 12/28/21: here today as a new pt referred by Dr. Corrinne Eagle for management of her newly found systolic HF. Pt started with a new onset of Afib in 2016 found during a surgery. She has never had a cardioversion or an ablation done. She was started on an Eliquis but did not tolerate it so it was stopped. She then suffered a CVA in 2018 with left sided  deficits that have since resolved, she was started on coumadin at this time. Dr. Anastasio Champion ordered an ECHO in October d/t episodes of HTN, return of swelling, and SOB and was found to have an EF of 25-30%. Prior to the ECHO she was started on metolazone with improvement of her symptoms. She has been on lasix for yrs with good urination. She also had noted orthopnea prior to her metolazone use. She does follow a low Na/fluid diet.       Patient follows:      Hospitalization:        Activity: ADLs without limitation   Diet: Educated and following    Patient has:  Chest Pain: no  SOB: resolved   Orthopnea/PND: resolved OSA: no  Edema: resolved  Fatigue: resolved   Abdominal bloating: no  Cough: no  Appetite: good      Past Medical History:   Diagnosis Date    Arthritis     Atrial fibrillation (HCC)     CAD (coronary artery disease)     Afib     Cerebral artery occlusion with cerebral infarction (HCC)     frequency of urine    Hypertension  Osteoporosis     Prolapse of female bladder, acquired     Thyroid disease     Removed      Past Surgical History:   Procedure Laterality Date    BLADDER SURGERY  03/11/2021    pessary    CARDIAC PROCEDURE Left 12/22/2021    Left heart cath / coronary angiography performed by Archie Balboa, MD at [facility] CARDIAC CATH LAB    FEMUR SURGERY  12/2014    left femur    FRACTURE SURGERY      STIMULATOR SURGERY N/A 02/11/2020    STAGE 1 INTERSTIM performed by Helane Rima, MD at [facility] OR    STIMULATOR SURGERY N/A 02/25/2020    STAGE 2 STIMULATOR INSERTION performed by Helane Rima, MD at [facility] OR    THYROID SURGERY  2005    TRANSESOPHAGEAL ECHOCARDIOGRAM N/A 01/05/2022    TRANSESOPHAGEAL ECHOCARDIOGRAM performed by Archie Balboa, MD at [facility] ENDOSCOPY     No family history on file.  Social History     Tobacco Use    Smoking status: Former     Current packs/day: 0.00     Types: Cigarettes     Quit date: 03/09/1992     Years since quitting: 30.3    Smokeless tobacco: Never   Substance Use  Topics    Alcohol use: Not Currently     Current Outpatient Medications   Medication Sig Dispense Refill    ENTRESTO 49-51 MG per tablet TAKE 1 TABLET BY MOUTH TWICE A DAY 60 tablet 0    spironolactone (ALDACTONE) 25 MG tablet Take 1 tablet by mouth daily 90 tablet 3    furosemide (LASIX) 20 MG tablet Take 1 tablet by mouth daily 90 tablet 3    Coenzyme Q10 (COQ10) 200 MG CAPS Take by mouth Daily      metoprolol succinate (TOPROL XL) 25 MG extended release tablet Take 0.5 tablets by mouth daily 45 tablet 3    dapagliflozin (FARXIGA) 10 MG tablet Take 1 tablet by mouth every morning 90 tablet 3    Multiple Vitamins-Minerals (THERAPEUTIC MULTIVITAMIN-MINERALS) tablet Take 1 tablet by mouth daily      metOLazone (ZAROXOLYN) 2.5 MG tablet 1 tablet Twice a week prn      Cholecalciferol (VITAMIN D3) 50 MCG (2000 UT) CAPS Take by mouth      D-Mannose 500 MG CAPS Take by mouth 2 times daily       Ascorbic Acid (VITAMIN C) 500 MG CHEW Take 500 mg by mouth daily      nitroGLYCERIN (NITROSTAT) 0.4 MG SL tablet up to max of 3 total doses. If no relief after 1 dose, call 911. 25 tablet 3    acetaminophen (TYLENOL) 500 MG tablet Take 1 tablet by mouth every 6 hours as needed for Pain      warfarin (COUMADIN) 3 MG tablet Take 1 tablet by mouth daily      levothyroxine (SYNTHROID) 100 MCG tablet Take 125 mcg by mouth Daily       No current facility-administered medications for this visit.     No Known Allergies    SUBJECTIVE:   Review of Systems   Constitutional:  Negative for activity change, appetite change and fatigue.   Respiratory:  Negative for cough, shortness of breath and wheezing.    Cardiovascular:  Negative for chest pain, palpitations and leg swelling.   Gastrointestinal:  Negative for abdominal distention and abdominal pain.   Musculoskeletal:  Negative for gait problem.  Neurological:  Negative for weakness, light-headedness and headaches.   Psychiatric/Behavioral:  Negative for sleep disturbance.        OBJECTIVE:    Today's Vitals:  BP (!) 150/70   Pulse (!) 107   Ht 1.499 m (4\' 11" )   Wt 41.5 kg (91 lb 8 oz)   SpO2 97%   BMI 18.48 kg/m     Physical Exam  Vitals reviewed.   Constitutional:       General: She is not in acute distress.     Appearance: Normal appearance. She is well-developed. She is not diaphoretic.   HENT:      Head: Normocephalic and atraumatic.   Eyes:      Conjunctiva/sclera: Conjunctivae normal.   Cardiovascular:      Rate and Rhythm: Tachycardia present. Rhythm irregular.      Heart sounds: Murmur (grade 2) heard.   Pulmonary:      Effort: Pulmonary effort is normal. No respiratory distress.      Breath sounds: Normal breath sounds. No wheezing, rhonchi or rales.   Abdominal:      General: Bowel sounds are normal. There is no distension.      Palpations: Abdomen is soft.      Tenderness: There is no abdominal tenderness.   Musculoskeletal:         General: Normal range of motion.      Cervical back: Normal range of motion and neck supple.      Right lower leg: No edema.      Left lower leg: No edema.   Skin:     General: Skin is warm and dry.      Capillary Refill: Capillary refill takes less than 2 seconds.   Neurological:      Mental Status: She is alert and oriented to person, place, and time.      Coordination: Coordination normal.   Psychiatric:         Behavior: Behavior normal.         Wt Readings from Last 3 Encounters:   07/05/22 41.5 kg (91 lb 8 oz)   05/26/22 44.8 kg (98 lb 12.8 oz)   03/30/22 43.2 kg (95 lb 3.2 oz)     BP Readings from Last 3 Encounters:   07/05/22 (!) 150/70   05/26/22 118/64   03/30/22 (!) 146/64     Pulse Readings from Last 3 Encounters:   07/05/22 (!) 107   03/30/22 (!) 49   03/09/22 51     Body mass index is 18.48 kg/m.    ECHO:     Echo Findings 02/2022    Left Ventricle Moderately reduced left ventricular systolic function with a visually estimated EF of 35 - 40%. Left ventricle size is normal. Normal wall thickness. Moderate global hypokinesis present.   Left  Atrium Left atrial volume index is severely increased (>48 mL/m2).   Right Ventricle Right ventricle size is normal. Normal wall thickness.   Right Atrium Right atrium is mildly dilated.   IVC/Hepatic Veins IVC diameter is less than or equal to 21 mm and decreases greater than 50% during inspiration; therefore the estimated right atrial pressure is normal (~3 mmHg). IVC size is normal.   Pericardium The pericardium is normal. No pericardial effusion.         Interpretation Summary: 12/2021         Left Ventricle: Severely reduced left ventricular systolic function with a visually estimated EF of 25 - 30%. Left ventricle size is  normal. Normal wall thickness. Severe global hypokinesis present.    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 Mildly calcified leaflet. Moderate regurgitation.    Tricuspid Valve: Moderate regurgitation.    Left Atrium: Left atrium is moderately dilated. Normal sized appendage. No left atrial appendage thrombus noted. No left atrial appendage mass noted.    Interatrial Septum: No interatrial shunt visualized with color Doppler. Agitated saline study was negative with and without provocation.    Right Atrium: Right atrium is mildly dilated.    Image quality is adequate.             CATH/STRESS:     Patent coronaries 2023      Results reviewed:  BNP: No results found for: "BNP"  CBC:   Lab Results   Component Value Date/Time    WBC 8.4 12/22/2021 12:04 PM    RBC 4.98 12/22/2021 12:04 PM    RBC 17 01/21/2020 09:08 AM    HGB 15.7 12/22/2021 12:04 PM    HCT 50.9 12/22/2021 12:04 PM    PLT 157 12/22/2021 12:04 PM     CMP:    Lab Results   Component Value Date/Time    NA 141 04/13/2022 12:16 PM    K 4.3 04/13/2022 12:16 PM    CL 104 04/13/2022 12:16 PM    CO2 26 04/13/2022 12:16 PM    BUN 28 04/13/2022 12:16 PM    CREATININE 0.7 04/13/2022 12:16 PM    LABGLOM 86 04/13/2022 12:16 PM    GLUCOSE 101 04/13/2022 12:16 PM    CALCIUM 9.4 04/13/2022 12:16 PM     Hepatic Function  Panel:    Lab Results   Component Value Date/Time    ALKPHOS 85 04/11/2015 05:15 AM    ALT 12 04/11/2015 05:15 AM    AST 17 04/11/2015 05:15 AM    BILITOT Negative 01/21/2020 09:08 AM    BILIDIR <0.2 04/10/2015 02:03 PM     Magnesium:    Lab Results   Component Value Date/Time    MG 1.7 04/13/2022 12:21 PM     PT/INR:    Lab Results   Component Value Date/Time    INR 1.66 12/22/2021 12:04 PM     Lipids:    Lab Results   Component Value Date/Time    TRIG 107 04/11/2015 05:15 AM    HDL 64 04/11/2015 05:15 AM       ASSESSMENT AND PLAN:   The patient's condition/symptoms are stable        Diagnosis Orders   1. Chronic systolic congestive heart failure, NYHA class 2 (HCC)  EKG 12 lead      2. Paroxysmal A-fib (HCC)  EKG 12 lead        Continue:  GDMT:   ACE/ARB/ARNI - Entresto 49/51 BID   BB - toprol 12.5 daily   Diuretic - Lasix 20 daily - metolazone 2.5 PRN twice a week  AA - Aldactone 25mg  daily   SGLT2 -  Farxiga 10 daily   Vasodilator -   Other - coumadin, synthroid      HFimpEF  35-40% 2024 (25-30% 2023)  Nonischemic - valvular vs chronic ? - (son did pass from cardiomyopathy)   MR - mod  TR - mod  Paroxsymal atrial fibrillation and typical a-flutter - spontaneous conversion to NSR previously - on coumadin - Afib today with HR of 107 - asymptomatic - Calling Dr. Anastasio Champion to see what he wants to do.   Bradycardia - previously decreased toprol,  will follow what Dr. Geradine Girt does  HTN - BP stable at home - will continue to monitor - no changes    Stable, no fluid on exam today. Tolerating current medications. Urinating well. To call Dr. Anastasio Champion to f/u on EKG/AFIB management. BP stable at home. No medication changes today.     GDMT: yes    Lab reviewed - K 4.3 Cr 0.7 mag 1.7 Hgb 15.7  ECHO 2023: 2-3+ MR, 3-4+ TR, +1 AI, RVSP 50, mild LA dilation  ECHO 2024 - limited - Severe LA dilation, mild RA dilation,   TEE 12/2021: Mod MR, Mod TR, bi-atrial dilation, RA pressure 3, no IVC dilation   CATH 2023: patent coronaries  LVEDP 15    No medication changes today    To call Dr. Morley Kos office for management of afib.     Continue diet/fluid adherence  Continue daily wts.  F/U w/ Cardiology  F/U in clinic in 3 month      Tolerating above noted HF meds, no ill side effects noted. Will continue to monitor kidney function and electrolytes. Will optimize as tolerated.   Pt is compliant w/ medications.    Total visit time of  minutes has been spent with patient on education of symptoms, management, medication, and plan of care; as well as review of chart: labs, ECHO, radiology reports, etc.   I personally spent more then 50% of the appt time face to face with the patient.  Daily weights  Fluid restriction of 2 Liters per day  Limit sodium in diet to around 1500-2000 mg/day  Monitor BP  Activity as tolerated     Patient was instructed to call the Heart Failure Clinic for any changes in symptoms as noted in AVS.      No follow-ups on file. or sooner if needed     Patient given educational materials - see patient instructions.   We discussed the importance of weighing oneself and recording daily. We also discussed the importance of a low sodium diet, higher sodium foods to avoid and better low sodium food options.   Patient verbalizes understanding of plan of care using teach back method, and is agreeable to the treatment plan.       Electronically signed by Crecencio Mc, APRN - CNP on 07/05/2022 at 11:42 AM

## 2022-07-05 NOTE — Patient Instructions (Signed)
You may receive a survey regarding the care you received during your visit.  Your input is valuable to [place].  We encourage you to complete and return your survey.  We hope you will choose [place] in the future for your healthcare needs.    Your nurses today were [place] and [employer].  Office hours:   Mon-Thurs 8-4:30  Friday 8-12  Phone: [phone]    Continue:  Continue current medications  Daily weights and record  Fluid restriction of 2 Liters per day  Limit sodium in diet to around 1500-2000 mg/day  Monitor BP  Activity as tolerated     Call the Heart Failure Clinic for any of the following symptoms:   Weight gain of -3 pounds in 1 day or 5 pounds in 1 week  Increased shortness of breath  Shortness of breath while laying down  Cough  Chest pain  Swelling in feet, ankles or legs  Bloating in abdomen  Fatigue        No medication changes today    To call Dr. Morley Kos office for management of afib.     Continue diet/fluid adherence  Continue daily wts.  F/U w/ Cardiology  F/U in clinic in 3 month

## 2022-09-13 MED ORDER — ENTRESTO 49-51 MG PO TABS
49-51 MG | ORAL_TABLET | Freq: Two times a day (BID) | ORAL | 3 refills | Status: DC
Start: 2022-09-13 — End: 2022-12-30

## 2022-10-13 ENCOUNTER — Ambulatory Visit: Admit: 2022-10-13 | Discharge: 2022-10-13 | Payer: MEDICARE | Attending: Family | Primary: Family Medicine

## 2022-10-13 DIAGNOSIS — I5022 Chronic systolic (congestive) heart failure: Secondary | ICD-10-CM

## 2022-10-13 NOTE — Progress Notes (Signed)
 Heart Failure Clinic       Visit Date: 10/13/2022  Cardiologist:  Dr. Theodosia - Shelvy Giovanni   Primary Care Physician: Dr. Heide Herder, MD    Michelle Bright is a 72 y.o. 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 female who presents to the office for a f/u patient visit in the heart failure clinic.    Concerns today: 3 month f/u. Weight is up but wanted. Urinating well. No hospitalizations. Started on Amio by primary cardiologist     Activity: ADLs without limitation   Diet: Educated and following    Patient has:  Chest Pain: no  SOB: resolved   Orthopnea/PND: resolved OSA: no  Edema: resolved  Fatigue: resolved   Abdominal bloating: no  Cough: no  Appetite: good    Visit on 07/05/22: here today for her 3 month f/u. Weight is stable. Urinating well, has not needed her metolazone . In a-fib upon arrival today and is asymptomatic. Denies leg swelling, bloating, orthopnea, or PND. Following diet.       Visit on 03/30/22: 8 week f/u. Pt did see Dr. Gala and her Melene was increased with 2 week ECHO to decide on ICD. Her EF did improved to 35-40%. Since her last visit she has noted improved appetite. Denies SOB, leg swelling, bloating, and orthopnea.     Visit on 01/28/22:  8 week f/u. Pt is s/p her TEE and her 48hr holter monitor. She is thus far tolerating her medications. She is getting repeat labs after her Farxiga  at the end of the month. Her TEE shows moderate MR with continued low EF. Her monitor shows sinus brady with some a-flutter - f/u w/ Hakim 2/14. Today she notes that since starting both the Entresto  and the Farxiga  she has noted less fatigue and less SOB. She denies leg swelling or orthopnea.  Following her diet.     Visit on 12/28/21: here today as a new pt referred by Dr. Gala for management of her newly found systolic HF. Pt started with a new onset of Afib in 2016 found during a surgery. She has never had a cardioversion or an ablation done. She was started on an Eliquis  but did not tolerate it so it was stopped. She then suffered a CVA in 2018 with left sided deficits that have since resolved, she was started on coumadin  at this time. Dr. Herby ordered an ECHO in October d/t episodes of HTN, return of swelling, and SOB and was found to have an EF of 25-30%. Prior to the ECHO she was started on metolazone  with improvement of her symptoms. She has been on lasix  for yrs with good urination. She also had noted orthopnea prior to her metolazone  use. She does follow a low Na/fluid diet.       Patient follows:      Hospitalization:              Past Medical History:   Diagnosis Date    Arthritis     Atrial fibrillation (HCC)  CAD (coronary artery disease)     Afib     Cerebral artery occlusion with cerebral infarction (HCC)     frequency of urine    Hypertension     Osteoporosis     Prolapse of female bladder, acquired     Thyroid disease     Removed      Past Surgical History:   Procedure Laterality Date    BLADDER SURGERY  03/11/2021    pessary    CARDIAC PROCEDURE Left 12/22/2021    Left heart cath / coronary angiography performed by Aurea Tawanna LABOR, MD at [facility] CARDIAC CATH LAB    FEMUR SURGERY  12/2014    left femur    FRACTURE SURGERY      STIMULATOR SURGERY N/A 02/11/2020    STAGE 1 INTERSTIM performed by Mancel Bathe, MD at [facility] OR    STIMULATOR SURGERY N/A 02/25/2020    STAGE 2 STIMULATOR INSERTION performed by Mancel Bathe, MD at [facility] OR    THYROID SURGERY  2005    TRANSESOPHAGEAL ECHOCARDIOGRAM N/A 01/05/2022    TRANSESOPHAGEAL ECHOCARDIOGRAM performed by Aurea Tawanna LABOR, MD at [facility] ENDOSCOPY     History reviewed. No pertinent family history.  Social History     Tobacco Use    Smoking  status: Former     Current packs/day: 0.00     Types: Cigarettes     Quit date: 03/09/1992     Years since quitting: 30.6    Smokeless tobacco: Never   Substance Use Topics    Alcohol use: Not Currently     Current Outpatient Medications   Medication Sig Dispense Refill    amiodarone  (CORDARONE ) 200 MG tablet Take 1 tablet by mouth daily      sacubitril -valsartan  (ENTRESTO ) 49-51 MG per tablet Take 1 tablet by mouth 2 times daily 180 tablet 3    spironolactone  (ALDACTONE ) 25 MG tablet Take 1 tablet by mouth daily 90 tablet 3    furosemide  (LASIX ) 20 MG tablet Take 1 tablet by mouth daily 90 tablet 3    Coenzyme Q10 (COQ10) 200 MG CAPS Take by mouth Daily      metoprolol  succinate (TOPROL  XL) 25 MG extended release tablet Take 0.5 tablets by mouth daily 45 tablet 3    dapagliflozin  (FARXIGA ) 10 MG tablet Take 1 tablet by mouth every morning 90 tablet 3    Multiple Vitamins-Minerals (THERAPEUTIC MULTIVITAMIN-MINERALS) tablet Take 1 tablet by mouth daily      Cholecalciferol (VITAMIN D3) 50 MCG (2000 UT) CAPS Take by mouth      D-Mannose 500 MG CAPS Take by mouth 2 times daily       Ascorbic Acid (VITAMIN C) 500 MG CHEW Take 500 mg by mouth daily      acetaminophen  (TYLENOL ) 500 MG tablet Take 1 tablet by mouth every 6 hours as needed for Pain      warfarin (COUMADIN ) 3 MG tablet Take 1 tablet by mouth daily      levothyroxine  (SYNTHROID ) 100 MCG tablet Take 62.5 mcg by mouth Daily      metOLazone  (ZAROXOLYN ) 2.5 MG tablet 1 tablet Twice a week prn      nitroGLYCERIN  (NITROSTAT ) 0.4 MG SL tablet up to max of 3 total doses. If no relief after 1 dose, call 911. 25 tablet 3     No current facility-administered medications for this visit.     No Known Allergies    SUBJECTIVE:   Review of Systems   Constitutional:  Negative for activity change, appetite change and fatigue.   Respiratory:  Negative for cough, shortness of breath and wheezing.    Cardiovascular:  Negative for chest pain, palpitations and leg swelling.    Gastrointestinal:  Negative for abdominal distention and abdominal pain.   Musculoskeletal:  Negative for gait problem.   Neurological:  Negative for weakness, light-headedness and headaches.   Psychiatric/Behavioral:  Negative for sleep disturbance.        OBJECTIVE:   Today's Vitals:  BP 120/70   Pulse 73   Ht 1.499 m (4' 11)   Wt 45.7 kg (100 lb 11.2 oz)   SpO2 98%   BMI 20.34 kg/m     Physical Exam  Vitals reviewed.   Constitutional:       General: She is not in acute distress.     Appearance: Normal appearance. She is well-developed. She is not diaphoretic.   HENT:      Head: Normocephalic and atraumatic.   Eyes:      Conjunctiva/sclera: Conjunctivae normal.   Cardiovascular:      Rate and Rhythm: Normal rate. Rhythm irregular.      Heart sounds: Murmur (grade 2) heard.   Pulmonary:      Effort: Pulmonary effort is normal. No respiratory distress.      Breath sounds: Normal breath sounds. No wheezing, rhonchi or rales.   Abdominal:      General: Bowel sounds are normal. There is no distension.      Palpations: Abdomen is soft.      Tenderness: There is no abdominal tenderness.   Musculoskeletal:         General: Normal range of motion.      Cervical back: Normal range of motion and neck supple.      Right lower leg: No edema.      Left lower leg: No edema.   Skin:     General: Skin is warm and dry.      Capillary Refill: Capillary refill takes less than 2 seconds.   Neurological:      Mental Status: She is alert and oriented to person, place, and time.      Coordination: Coordination normal.   Psychiatric:         Behavior: Behavior normal.         Wt Readings from Last 3 Encounters:   10/13/22 45.7 kg (100 lb 11.2 oz)   07/05/22 41.5 kg (91 lb 8 oz)   05/26/22 44.8 kg (98 lb 12.8 oz)     BP Readings from Last 3 Encounters:   10/13/22 120/70   07/05/22 (!) 140/70   05/26/22 118/64     Pulse Readings from Last 3 Encounters:   10/13/22 73   07/05/22 100   03/30/22 (!) 49     Body mass index is 20.34  kg/m.    ECHO:     Echo Findings 02/2022    Left Ventricle Moderately reduced left ventricular systolic function with a visually estimated EF of 35 - 40%. Left ventricle size is normal. Normal wall thickness. Moderate global hypokinesis present.   Left Atrium Left atrial volume index is severely increased (>48 mL/m2).   Right Ventricle Right ventricle size is normal. Normal wall thickness.   Right Atrium Right atrium is mildly dilated.   IVC/Hepatic Veins IVC diameter is less than or equal to 21 mm and decreases greater than 50% during inspiration; therefore the estimated right atrial pressure is normal (~3 mmHg). IVC size is normal.  Pericardium The pericardium is normal. No pericardial effusion.         Interpretation Summary: 12/2021         Left Ventricle: Severely reduced left ventricular systolic function with a visually estimated EF of 25 - 30%. Left ventricle size is normal. Normal wall thickness. Severe global hypokinesis present.    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 Mildly calcified leaflet. Moderate regurgitation.    Tricuspid Valve: Moderate regurgitation.    Left Atrium: Left atrium is moderately dilated. Normal sized appendage. No left atrial appendage thrombus noted. No left atrial appendage mass noted.    Interatrial Septum: No interatrial shunt visualized with color Doppler. Agitated saline study was negative with and without provocation.    Right Atrium: Right atrium is mildly dilated.    Image quality is adequate.             CATH/STRESS:     Patent coronaries 2023      Results reviewed:  BNP: No results found for: BNP  CBC:   Lab Results   Component Value Date/Time    WBC 8.4 12/22/2021 12:04 PM    RBC 4.98 12/22/2021 12:04 PM    RBC 17 01/21/2020 09:08 AM    HGB 15.7 12/22/2021 12:04 PM    HCT 50.9 12/22/2021 12:04 PM    PLT 157 12/22/2021 12:04 PM     CMP:    Lab Results   Component Value Date/Time    NA 141 04/13/2022 12:16 PM    K 4.3 04/13/2022 12:16 PM    CL  104 04/13/2022 12:16 PM    CO2 26 04/13/2022 12:16 PM    BUN 28 04/13/2022 12:16 PM    CREATININE 0.7 04/13/2022 12:16 PM    LABGLOM 86 04/13/2022 12:16 PM    GLUCOSE 101 04/13/2022 12:16 PM    CALCIUM  9.4 04/13/2022 12:16 PM     Hepatic Function Panel:    Lab Results   Component Value Date/Time    ALKPHOS 85 04/11/2015 05:15 AM    ALT 12 04/11/2015 05:15 AM    AST 17 04/11/2015 05:15 AM    BILITOT Negative 01/21/2020 09:08 AM    BILIDIR <0.2 04/10/2015 02:03 PM     Magnesium :    Lab Results   Component Value Date/Time    MG 1.7 04/13/2022 12:21 PM     PT/INR:    Lab Results   Component Value Date/Time    INR 1.66 12/22/2021 12:04 PM     Lipids:    Lab Results   Component Value Date/Time    TRIG 107 04/11/2015 05:15 AM    HDL 64 04/11/2015 05:15 AM       ASSESSMENT AND PLAN:   The patient's condition/symptoms are stable        Diagnosis Orders   1. Chronic systolic congestive heart failure, NYHA class 2, stage C (HCC)        2. Persistent atrial fibrillation (HCC)        3. Nonischemic cardiomyopathy (HCC)        4. At risk for fluid volume overload        5. Mitral valve insufficiency, unspecified etiology            Continue:  GDMT:   ACE/ARB/ARNI - Entresto  49/51 BID   BB - toprol  12.5 daily   Diuretic - Lasix  20 daily - metolazone  2.5 PRN twice a week PRN  AA - Aldactone  25mg  daily   SGLT2 -  Farxiga   10 daily   Vasodilator -   Other - coumadin , synthroid , amio 200 daily       HFimpEF  35-40% 2024 (25-30% 2023)  Nonischemic - valvular vs chronic ? - (son did pass from cardiomyopathy)   MR - mod  TR - mod  Persistent atrial fibrillation and typical a-flutter - spontaneous conversion to NSR previously - on coumadin   and amio - primary cardiologist managing   Bradycardia - resolved   HTN - stable on current medications, no changes.     Stable, no fluid on exam today. Tolerating current medications. Urinating well. No changes today. 6 month    GDMT: yes    Lab reviewed - K 4.3 Cr 0.7 mag 1.7 Hgb 15.7    ECHO 2023:  2-3+ MR, 3-4+ TR, +1 AI, RVSP 50, mild LA dilation  ECHO 2024 - limited - Severe LA dilation, mild RA dilation,   TEE 12/2021: Mod MR, Mod TR, bi-atrial dilation, RA pressure 3, no IVC dilation   CATH 2023: patent coronaries LVEDP 15    No medication changes today      Continue diet/fluid adherence  Continue daily wts.  F/U w/ Cardiology  F/U in clinic in 6 month      Tolerating above noted HF meds, no ill side effects noted. Will continue to monitor kidney function and electrolytes. Will optimize as tolerated.   Pt is compliant w/ medications.    Total visit time of  minutes has been spent with patient on education of symptoms, management, medication, and plan of care; as well as review of chart: labs, ECHO, radiology reports, etc.   I personally spent more then 50% of the appt time face to face with the patient.  Daily weights  Fluid restriction of 2 Liters per day  Limit sodium in diet to around 1500-2000 mg/day  Monitor BP  Activity as tolerated     Patient was instructed to call the Heart Failure Clinic for any changes in symptoms as noted in AVS.      Return in about 6 months (around 04/12/2023). or sooner if needed     Patient given educational materials - see patient instructions.   We discussed the importance of weighing oneself and recording daily. We also discussed the importance of a low sodium diet, higher sodium foods to avoid and better low sodium food options.   Patient verbalizes understanding of plan of care using teach back method, and is agreeable to the treatment plan.       Electronically signed by Sallyanne Moons, APRN - CNP on 10/13/2022 at 11:30 AM

## 2022-10-13 NOTE — Patient Instructions (Addendum)
 You may receive a survey regarding the care you received during your visit.  Your input is valuable to [place].  We encourage you to complete and return your survey.  We hope you will choose [place] in the future for your healthcare needs.    Your nurses today were [place] and [employer].  Office hours:   Mon-Thurs 8-4:30  Friday 8-12  Phone: [phone]    Continue:  Continue current medications  Daily weights and record  Fluid restriction of 2 Liters per day  Limit sodium in diet to around 1500-2000 mg/day  Monitor BP  Activity as tolerated     Call the Heart Failure Clinic for any of the following symptoms:   Weight gain of -3 pounds in 1 day or 5 pounds in 1 week  Increased shortness of breath  Shortness of breath while laying down  Cough  Chest pain  Swelling in feet, ankles or legs  Bloating in abdomen  Fatigue        No medication changes today      Continue diet/fluid adherence  Continue daily wts.  F/U w/ Cardiology  F/U in clinic in 6 month

## 2022-12-14 ENCOUNTER — Encounter: Admit: 2022-12-14 | Admitting: Family

## 2022-12-14 DIAGNOSIS — I428 Other cardiomyopathies: Secondary | ICD-10-CM

## 2022-12-14 MED ORDER — FARXIGA 10 MG PO TABS
10 | ORAL_TABLET | Freq: Every morning | ORAL | 3 refills | Status: DC
Start: 2022-12-14 — End: 2023-12-07

## 2022-12-23 ENCOUNTER — Inpatient Hospital Stay
Admission: EM | Admit: 2022-12-23 | Discharge: 2022-12-30 | Disposition: A | Discharge status: Skilled Nursing Facility | Payer: MEDICARE | Admitting: Student in an Organized Health Care Education/Training Program

## 2022-12-23 ENCOUNTER — Encounter: Admit: 2022-12-23 | Primary: Family Medicine

## 2022-12-23 ENCOUNTER — Emergency Department: Admit: 2022-12-23 | Payer: MEDICARE | Primary: Family Medicine

## 2022-12-23 DIAGNOSIS — A419 Sepsis, unspecified organism: Secondary | ICD-10-CM

## 2022-12-23 DIAGNOSIS — W19XXXA Unspecified fall, initial encounter: Secondary | ICD-10-CM

## 2022-12-23 LAB — LACTATE, SEPSIS: Lactic Acid, Sepsis: 1.9 mmol/L (ref 0.5–1.9)

## 2022-12-23 LAB — MICROSCOPIC URINALYSIS
Bilirubin, Urine: NEGATIVE
Casts: >15 /[LPF]
Crystals: NONE SEEN
Glucose, Ur: 500 mg/dL — AB
Ketones, Urine: NEGATIVE
Miscellaneous Lab Test Result: NONE SEEN
Nitrite, Urine: NEGATIVE
Protein, UA: 30 mg/dL — AB
RBC, UA: >200 /[HPF] (ref 0–?)
Renal Epithelial, UA: NONE SEEN
Specific Gravity, UA: 1.024 (ref 1.002–1.030)
Urobilinogen, Urine: 0.2 U/dL (ref 0.0–1.0)
WBC, UA: >200 /[HPF] (ref 0–?)
pH, Urine: 5 (ref 5.0–9.0)

## 2022-12-23 LAB — CBC WITH AUTO DIFFERENTIAL
Basophils Absolute: 0 10*3/uL (ref 0.0–0.1)
Basophils: 0.2 %
Eosinophils Absolute: 0 10*3/uL (ref 0.0–0.4)
Eosinophils: 0.2 %
Hematocrit: 34.8 % — ABNORMAL LOW (ref 37.0–47.0)
Hemoglobin: 10.9 g/dL — ABNORMAL LOW (ref 12.0–16.0)
Immature Grans (Abs): 0.1 10*3/uL — ABNORMAL HIGH (ref 0.00–0.07)
Immature Granulocytes %: 0.8 %
Lymphocytes Absolute: 0.8 10*3/uL — ABNORMAL LOW (ref 1.0–4.8)
Lymphocytes: 6.6 %
MCH: 32.2 pg (ref 26.0–33.0)
MCHC: 31.3 g/dL — ABNORMAL LOW (ref 32.2–35.5)
MCV: 102.7 fL — ABNORMAL HIGH (ref 81.0–99.0)
MPV: 10.7 fL (ref 9.4–12.4)
Monocytes %: 9.5 %
Monocytes Absolute: 1.1 10*3/uL (ref 0.4–1.3)
Neutrophils Absolute: 9.9 10*3/uL — ABNORMAL HIGH (ref 1.8–7.7)
Platelets: 188 10*3/uL (ref 130–400)
RBC: 3.39 10*6/uL — ABNORMAL LOW (ref 4.20–5.40)
RDW-CV: 14.5 % (ref 11.5–14.5)
RDW-SD: 55 fL — ABNORMAL HIGH (ref 35.0–45.0)
Seg Neutrophils: 82.7 %
WBC: 12 10*3/uL — ABNORMAL HIGH (ref 4.8–10.8)
nRBC: 0 /100{WBCs}

## 2022-12-23 LAB — BASIC METABOLIC PANEL
BUN: 71 mg/dL — ABNORMAL HIGH (ref 7–22)
CO2: 19 meq/L — ABNORMAL LOW (ref 23–33)
Calcium: 10.4 mg/dL (ref 8.5–10.5)
Chloride: 108 meq/L (ref 98–111)
Creatinine: 1.3 mg/dL — ABNORMAL HIGH (ref 0.4–1.2)
Glucose: 89 mg/dL (ref 70–108)
Potassium: 5.8 meq/L — ABNORMAL HIGH (ref 3.5–5.2)
Sodium: 139 meq/L (ref 135–145)

## 2022-12-23 LAB — CK: Total CK: 83 U/L (ref 30–135)

## 2022-12-23 LAB — OSMOLALITY: Osmolality Calc: 297.8 mosm/kg (ref 275.0–300.0)

## 2022-12-23 LAB — GLOMERULAR FILTRATION RATE, ESTIMATED: Est, Glom Filt Rate: 43 mL/min/{1.73_m2} — AB (ref >60)

## 2022-12-23 LAB — PROTIME-INR: INR: 3.42 — ABNORMAL HIGH (ref 0.85–1.13)

## 2022-12-23 LAB — ANION GAP: Anion Gap: 12 meq/L (ref 8.0–16.0)

## 2022-12-23 MED ORDER — SODIUM CHLORIDE 0.9 % IV BOLUS
0.9 | Freq: Once | INTRAVENOUS | Status: AC
Start: 2022-12-23 — End: 2022-12-23
  Administered 2022-12-23: 22:00:00 1000 mL via INTRAVENOUS

## 2022-12-23 MED ORDER — FENTANYL CITRATE (PF) 100 MCG/2ML IJ SOLN
100 | Freq: Once | INTRAMUSCULAR | Status: AC
Start: 2022-12-23 — End: 2022-12-23
  Administered 2022-12-23: 22:00:00 50 ug via INTRAVENOUS

## 2022-12-23 MED ORDER — HYDROMORPHONE HCL 1 MG/ML IJ SOLN
1 | Freq: Once | INTRAMUSCULAR | Status: AC
Start: 2022-12-23 — End: 2022-12-23
  Administered 2022-12-23: 23:00:00 0.5 mg via INTRAVENOUS

## 2022-12-23 MED FILL — FENTANYL CITRATE (PF) 100 MCG/2ML IJ SOLN: 100 MCG/2ML | INTRAMUSCULAR | Qty: 2

## 2022-12-23 MED FILL — HYDROMORPHONE HCL 1 MG/ML IJ SOLN: 1 MG/ML | INTRAMUSCULAR | Qty: 1

## 2022-12-23 NOTE — ED Notes (Signed)
 Pt restful on cart, VSS. Oral care performed, chapstick provided.

## 2022-12-23 NOTE — ED Notes (Signed)
 Called 6k charge ok to transport to rm 3 in stable condition.

## 2022-12-23 NOTE — ED Notes (Signed)
 Pt ot rm 40 per EMS- transfer from [facility] ER for sacral fx s/p fall a couple days ago. Pt states she fell on gravel, thought it would just 'get better'. States she has been ambulatory w/walker but states pain kept getting worse. On arrival pt A&Ox3

## 2022-12-23 NOTE — Consults (Signed)
 Orthopedic Consult    Requesting Physician: Shawna Clamp, APRN*    CHIEF COMPLAINT:  tailbone pain    HISTORY OF PRESENT ILLNESS:      The patient is a 72 y.o. female who is an walker/cane assisted ambulator at baseline with a history of A-fib on Cou

## 2022-12-23 NOTE — ED Notes (Signed)
 ED to inpatient nurses report      Chief Complaint:  Chief Complaint   Patient presents with    Fall     Present to ED from: Transfer from [facility]. Pt lives at home w/husband    MOA:     LOC: alert and orientated to name, place, date  Mobility: Requ

## 2022-12-23 NOTE — Consults (Signed)
 Trauma Consult  Dr. Jim Desanctis Akaiya Touchette     Patient:  Michelle Bright  Admit date: 12/23/2022   Date of Birth: 1950-08-24 Date of Evaluation: 12/23/2022  MRN: [id number]  Acct: [id number]    Injury Date:12/19/2022  Injury time:Afternoon  PCP: Chalasani, P

## 2022-12-23 NOTE — ED Notes (Signed)
 Pt yelling out frequently- redirected multiple times to use call button that is in her hand. Pt has various needs/complaints- warm blankets provided, oral care performed, lights off to promote rest. VSS

## 2022-12-23 NOTE — ED Notes (Signed)
 Pt medicated per MAR. Pt appears to be having intermittent muscle spasms- pt will be laying quietly on cart w/eyes closed then suddenly jump and cry out that she needs pain medication- then she will be restful again right after.

## 2022-12-23 NOTE — H&P (Addendum)
 Hospitalist History & Physical    Patient:  Michelle Bright    Unit/Bed:40/040A  Date of Birth: 07-21-1950  MRN: [id number]   Acct: [id number]   PCP: Elton Sin, MD  Code Status: Prior    Date of Service: Pt seen/examined on 12/23/22 and a

## 2022-12-23 NOTE — ED Provider Notes (Signed)
 [facility]  EMERGENCY DEPARTMENT ENCOUNTER          Pt Name: Michelle Bright  MRN: [id number]  Birthdate 01-07-1951  Date of evaluation: 12/23/2022  Emergency Physician: Milon Dikes, DO    CHIEF COMPLAINT   Chief Complaint

## 2022-12-23 NOTE — Progress Notes (Signed)
 MRI screening form done with patient. She requested to have it done tomorrow as she currently still in discomfort and would like to rest. MRI called for update but no answer from them.

## 2022-12-23 NOTE — ED Notes (Signed)
 Pt states pain 10/10 all the time- doesn't change with medications. States she needs the strongest medication we got- pt advised she has been given some pretty strong medications but d/t having fractures we wont be able to make her totally pain free but we

## 2022-12-23 NOTE — Progress Notes (Signed)
 Pt admitted to  6K3 from ED via stretcher   Complaints: fall.    IV sites free infiltrated, hence both are removed. ED staff will come in to put new IV access on patient. Vital signs obtained. Assessment and data collection initiated. Two nurse skin assess

## 2022-12-24 ENCOUNTER — Inpatient Hospital Stay: Admit: 2022-12-24 | Payer: MEDICARE | Primary: Family Medicine

## 2022-12-24 LAB — EKG 12-LEAD
Atrial Rate: 92 {beats}/min
P Axis: 100 degrees
P-R Interval: 190 ms
Q-T Interval: 406 ms
QRS Duration: 150 ms
QTc Calculation (Bazett): 502 ms
R Axis: -24 degrees
T Axis: 131 degrees
Ventricular Rate: 92 {beats}/min

## 2022-12-24 LAB — BASIC METABOLIC PANEL W/ REFLEX TO MG FOR LOW K
BUN: 62 mg/dL — ABNORMAL HIGH (ref 7–22)
BUN: 59 mg/dL — ABNORMAL HIGH (ref 7–22)
CO2: 19 meq/L — ABNORMAL LOW (ref 23–33)
CO2: 18 meq/L — ABNORMAL LOW (ref 23–33)
Calcium: 9.9 mg/dL (ref 8.5–10.5)
Calcium: 9.6 mg/dL (ref 8.5–10.5)
Chloride: 109 meq/L (ref 98–111)
Chloride: 110 meq/L (ref 98–111)
Creatinine: 1.2 mg/dL (ref 0.4–1.2)
Creatinine: 1.1 mg/dL (ref 0.4–1.2)
Glucose: 91 mg/dL (ref 70–108)
Glucose: 68 mg/dL — ABNORMAL LOW (ref 70–108)
Potassium reflex Magnesium: 5.8 meq/L — ABNORMAL HIGH (ref 3.5–5.2)
Potassium reflex Magnesium: 5.2 meq/L (ref 3.5–5.2)
Sodium: 141 meq/L (ref 135–145)
Sodium: 139 meq/L (ref 135–145)

## 2022-12-24 LAB — CBC WITH AUTO DIFFERENTIAL
Basophils Absolute: 0 10*3/uL (ref 0.0–0.1)
Basophils: 0.2 %
Eosinophils Absolute: 0.1 10*3/uL (ref 0.0–0.4)
Eosinophils: 1 %
Hematocrit: 37.5 % (ref 37.0–47.0)
Hemoglobin: 11.4 g/dL — ABNORMAL LOW (ref 12.0–16.0)
Immature Grans (Abs): 0.1 10*3/uL — ABNORMAL HIGH (ref 0.00–0.07)
Immature Granulocytes %: 0.9 %
Lymphocytes Absolute: 1.2 10*3/uL (ref 1.0–4.8)
Lymphocytes: 10.8 %
MCH: 32.9 pg (ref 26.0–33.0)
MCHC: 30.4 g/dL — ABNORMAL LOW (ref 32.2–35.5)
MCV: 108.1 fL — ABNORMAL HIGH (ref 81.0–99.0)
MPV: 10.7 fL (ref 9.4–12.4)
Monocytes %: 11.9 %
Monocytes Absolute: 1.3 10*3/uL (ref 0.4–1.3)
Neutrophils Absolute: 8.3 10*3/uL — ABNORMAL HIGH (ref 1.8–7.7)
Platelets: 188 10*3/uL (ref 130–400)
RBC: 3.47 10*6/uL — ABNORMAL LOW (ref 4.20–5.40)
RDW-CV: 14.5 % (ref 11.5–14.5)
RDW-SD: 58.1 fL — ABNORMAL HIGH (ref 35.0–45.0)
Seg Neutrophils: 75.2 %
WBC: 11 10*3/uL — ABNORMAL HIGH (ref 4.8–10.8)
nRBC: 0 /100{WBCs}

## 2022-12-24 LAB — PROTIME-INR: INR: 2.3 — ABNORMAL HIGH (ref 0.85–1.13)

## 2022-12-24 LAB — OSMOLALITY
Osmolality Calc: 298.5 mosm/kg (ref 275.0–300.0)
Osmolality Calc: 292.4 mosm/kg (ref 275.0–300.0)

## 2022-12-24 LAB — T4, FREE: T4 Free: 0.85 ng/dL — ABNORMAL LOW (ref 0.93–1.68)

## 2022-12-24 LAB — GLOMERULAR FILTRATION RATE, ESTIMATED
Est, Glom Filt Rate: 47 mL/min/{1.73_m2} — AB (ref >60)
Est, Glom Filt Rate: 53 mL/min/{1.73_m2} — AB (ref >60)

## 2022-12-24 LAB — MAGNESIUM: Magnesium: 2.2 mg/dL (ref 1.6–2.4)

## 2022-12-24 LAB — POCT GLUCOSE
POC Glucose: 105 mg/dL (ref 70–108)
POC Glucose: 144 mg/dL — ABNORMAL HIGH (ref 70–108)
POC Glucose: 76 mg/dL (ref 70–108)
POC Glucose: 67 mg/dL — ABNORMAL LOW (ref 70–108)
POC Glucose: 105 mg/dL (ref 70–108)
POC Glucose: 102 mg/dL (ref 70–108)
POC Glucose: 179 mg/dL — ABNORMAL HIGH (ref 70–108)

## 2022-12-24 LAB — ANION GAP
Anion Gap: 13 meq/L (ref 8.0–16.0)
Anion Gap: 11 meq/L (ref 8.0–16.0)

## 2022-12-24 LAB — LACTATE, SEPSIS: Lactic Acid, Sepsis: 1.9 mmol/L (ref 0.5–1.9)

## 2022-12-24 LAB — APTT: aPTT: 33.8 s (ref 22.0–38.0)

## 2022-12-24 LAB — TSH: TSH: 3.8 u[IU]/mL (ref 0.400–4.200)

## 2022-12-24 MED ORDER — MORPHINE SULFATE (PF) 2 MG/ML IV SOLN
2 MG/ML | INTRAVENOUS | Status: DC | PRN
Start: 2022-12-24 — End: 2022-12-26

## 2022-12-24 MED ORDER — EMPAGLIFLOZIN 10 MG PO TABS
10 MG | Freq: Every day | ORAL | Status: DC
Start: 2022-12-24 — End: 2022-12-30

## 2022-12-24 MED ORDER — STERILE WATER FOR INJECTION (MIXTURES ONLY)
2 g | INTRAMUSCULAR | Status: AC
Start: 2022-12-24 — End: 2022-12-27
  Administered 2022-12-24 – 2022-12-26 (×3): 2000 mg via INTRAVENOUS

## 2022-12-24 MED ORDER — ACETAMINOPHEN 650 MG RE SUPP
650 | Freq: Four times a day (QID) | RECTAL | Status: DC | PRN
Start: 2022-12-24 — End: 2022-12-30

## 2022-12-24 MED ORDER — FUROSEMIDE 20 MG PO TABS
20 MG | Freq: Every day | ORAL | Status: AC
Start: 2022-12-24 — End: 2022-12-29

## 2022-12-24 MED ORDER — NORMAL SALINE FLUSH 0.9 % IV SOLN
0.9 % | INTRAVENOUS | Status: AC | PRN
Start: 2022-12-24 — End: 2022-12-30

## 2022-12-24 MED ORDER — SODIUM CHLORIDE 0.9 % IV SOLN
0.9 | INTRAVENOUS | Status: AC
Start: 2022-12-24 — End: 2022-12-24
  Administered 2022-12-24: 04:00:00 via INTRAVENOUS

## 2022-12-24 MED ORDER — DEXTROSE 10 % IV SOLN
10 | INTRAVENOUS | Status: DC | PRN
Start: 2022-12-24 — End: 2022-12-24

## 2022-12-24 MED ORDER — ONDANSETRON HCL 4 MG/2ML IJ SOLN
42 MG/2ML | Freq: Four times a day (QID) | INTRAMUSCULAR | Status: AC | PRN
Start: 2022-12-24 — End: 2022-12-30

## 2022-12-24 MED ORDER — IPRATROPIUM BROMIDE 0.02 % IN SOLN
0.02 | RESPIRATORY_TRACT | Status: DC | PRN
Start: 2022-12-24 — End: 2022-12-24

## 2022-12-24 MED ORDER — SPIRONOLACTONE 25 MG PO TABS
25 MG | Freq: Every day | ORAL | Status: AC
Start: 2022-12-24 — End: 2022-12-30

## 2022-12-24 MED ORDER — MORPHINE SULFATE (PF) 2 MG/ML IV SOLN
2 MG/ML | INTRAVENOUS | Status: DC | PRN
Start: 2022-12-24 — End: 2022-12-26
  Administered 2022-12-24 – 2022-12-26 (×11): 2 mg via INTRAVENOUS

## 2022-12-24 MED ORDER — GLUCAGON HCL (DIAGNOSTIC) 1 MG IJ SOLR
1 MG | INTRAMUSCULAR | Status: AC | PRN
Start: 2022-12-24 — End: 2022-12-30

## 2022-12-24 MED ORDER — ACETAMINOPHEN 325 MG PO TABS
325 MG | Freq: Four times a day (QID) | ORAL | Status: DC | PRN
Start: 2022-12-24 — End: 2022-12-30
  Administered 2022-12-24 – 2022-12-30 (×8): 650 mg via ORAL

## 2022-12-24 MED ORDER — DEXTROSE 10 % IV BOLUS
INTRAVENOUS | Status: DC | PRN
Start: 2022-12-24 — End: 2022-12-30

## 2022-12-24 MED ORDER — CALCIUM GLUCONATE-NACL 2-0.675 GM/100ML-% IV SOLN
Freq: Once | INTRAVENOUS | Status: AC
Start: 2022-12-24 — End: 2022-12-23
  Administered 2022-12-24: 04:00:00 2000 mg via INTRAVENOUS

## 2022-12-24 MED ORDER — ALBUTEROL SULFATE (2.5 MG/3ML) 0.083% IN NEBU
RESPIRATORY_TRACT | Status: DC | PRN
Start: 2022-12-24 — End: 2022-12-24

## 2022-12-24 MED ORDER — LIDOCAINE 4 % EX PTCH
4 % | Freq: Every day | CUTANEOUS | Status: DC
Start: 2022-12-24 — End: 2022-12-27
  Administered 2022-12-24 – 2022-12-27 (×3): 3 via TRANSDERMAL

## 2022-12-24 MED ORDER — DEXTROSE 10 % IV SOLN
10 % | INTRAVENOUS | Status: AC | PRN
Start: 2022-12-24 — End: 2022-12-30

## 2022-12-24 MED ORDER — LEVOTHYROXINE SODIUM 125 MCG PO TABS
125 MCG | Freq: Every day | ORAL | Status: AC
Start: 2022-12-24 — End: 2022-12-30
  Administered 2022-12-24 – 2022-12-30 (×7): 125 ug via ORAL

## 2022-12-24 MED ORDER — AMIODARONE HCL 200 MG PO TABS
200 MG | Freq: Every day | ORAL | Status: AC
Start: 2022-12-24 — End: 2022-12-30
  Administered 2022-12-24 – 2022-12-30 (×7): 200 mg via ORAL

## 2022-12-24 MED ORDER — DEXTROSE 10 % IV BOLUS
Freq: Once | INTRAVENOUS | Status: AC
Start: 2022-12-24 — End: 2022-12-24
  Administered 2022-12-24: 06:00:00 250 mL via INTRAVENOUS

## 2022-12-24 MED ORDER — NORMAL SALINE FLUSH 0.9 % IV SOLN
0.9 % | Freq: Two times a day (BID) | INTRAVENOUS | Status: AC
Start: 2022-12-24 — End: 2022-12-30
  Administered 2022-12-24 – 2022-12-30 (×8): 10 mL via INTRAVENOUS

## 2022-12-24 MED ORDER — GLUCOSE 4 G PO CHEW
4 | ORAL | Status: DC | PRN
Start: 2022-12-24 — End: 2022-12-24

## 2022-12-24 MED ORDER — DEXTROSE 10 % IV BOLUS
INTRAVENOUS | Status: DC | PRN
Start: 2022-12-24 — End: 2022-12-24

## 2022-12-24 MED ORDER — GLUCOSE 4 G PO CHEW
4 g | ORAL | Status: AC | PRN
Start: 2022-12-24 — End: 2022-12-29
  Administered 2022-12-24: 10:00:00 16 via ORAL

## 2022-12-24 MED ORDER — SACUBITRIL-VALSARTAN 49-51 MG PO TABS
49-51 MG | Freq: Two times a day (BID) | ORAL | Status: AC
Start: 2022-12-24 — End: 2022-12-30

## 2022-12-24 MED ORDER — GLUCAGON HCL (DIAGNOSTIC) 1 MG IJ SOLR
1 | INTRAMUSCULAR | Status: DC | PRN
Start: 2022-12-24 — End: 2022-12-24

## 2022-12-24 MED ORDER — ONDANSETRON 4 MG PO TBDP
4 MG | Freq: Three times a day (TID) | ORAL | Status: DC | PRN
Start: 2022-12-24 — End: 2022-12-30
  Administered 2022-12-26: 22:00:00 4 mg via ORAL

## 2022-12-24 MED ORDER — DEXTROSE 10 % IV BOLUS
INTRAVENOUS | Status: AC | PRN
Start: 2022-12-24 — End: 2022-12-30

## 2022-12-24 MED ORDER — IPRATROPIUM-ALBUTEROL 0.5-2.5 (3) MG/3ML IN SOLN
0.5-2.533 (3) MG/3ML | RESPIRATORY_TRACT | Status: AC | PRN
Start: 2022-12-24 — End: 2022-12-30
  Administered 2022-12-24: 23:00:00 1 via RESPIRATORY_TRACT

## 2022-12-24 MED ORDER — METOPROLOL SUCCINATE ER 25 MG PO TB24
25 MG | Freq: Every day | ORAL | Status: AC
Start: 2022-12-24 — End: 2022-12-30
  Administered 2022-12-24 – 2022-12-30 (×6): 12.5 mg via ORAL

## 2022-12-24 MED ORDER — SODIUM CHLORIDE 0.9 % IV SOLN
0.9 % | INTRAVENOUS | Status: DC | PRN
Start: 2022-12-24 — End: 2022-12-30

## 2022-12-24 MED ORDER — SODIUM ZIRCONIUM CYCLOSILICATE 10 G PO PACK
10 | Freq: Once | ORAL | Status: AC
Start: 2022-12-24 — End: 2022-12-23
  Administered 2022-12-24: 04:00:00 10 g via ORAL

## 2022-12-24 MED ORDER — INSULIN REGULAR HUMAN 100 UNIT/ML IJ SOLN
100 | Freq: Once | INTRAMUSCULAR | Status: AC
Start: 2022-12-24 — End: 2022-12-24
  Administered 2022-12-24: 06:00:00 10 [IU] via INTRAVENOUS

## 2022-12-24 MED FILL — LIDOCARE ARM/NECK/LEG 4 % EX PTCH: 4 % | CUTANEOUS | Qty: 3

## 2022-12-24 MED FILL — ACETAMINOPHEN 325 MG PO TABS: 325 MG | ORAL | Qty: 2

## 2022-12-24 MED FILL — SYNTHROID 125 MCG PO TABS: 125 MCG | ORAL | Qty: 1

## 2022-12-24 MED FILL — MORPHINE SULFATE 2 MG/ML IJ SOLN: 2 mg/mL | INTRAMUSCULAR | Qty: 1

## 2022-12-24 MED FILL — METOPROLOL SUCCINATE ER 25 MG PO TB24: 25 MG | ORAL | Qty: 1

## 2022-12-24 MED FILL — TRUEPLUS GLUCOSE 4 G PO CHEW: 4 g | ORAL | Qty: 1

## 2022-12-24 MED FILL — CALCIUM GLUCONATE-NACL 2-0.675 GM/100ML-% IV SOLN: INTRAVENOUS | Qty: 100

## 2022-12-24 MED FILL — AMIODARONE HCL 200 MG PO TABS: 200 MG | ORAL | Qty: 1

## 2022-12-24 MED FILL — LOKELMA 10 G PO PACK: 10 g | ORAL | Qty: 1

## 2022-12-24 MED FILL — HUMULIN R 100 UNIT/ML IJ SOLN: 100 UNIT/ML | INTRAMUSCULAR | Qty: 10

## 2022-12-24 MED FILL — CEFTRIAXONE SODIUM 2 G IJ SOLR: 2 g | INTRAMUSCULAR | Qty: 2000

## 2022-12-24 NOTE — Progress Notes (Signed)
 Hospitalist Progress Note      Patient:  Michelle Bright    Unit/Bed:6K-03/003-A  Date of Birth: 02/20/1950  MRN: [id number]   Acct: [id number]   PCP: Elton Sin, MD  Date of A

## 2022-12-24 NOTE — Progress Notes (Signed)
 Unable to complete orthostatic blood pressure check every shift due to patient's refusal. Patient stated she doesn't want to aggravate her pain.

## 2022-12-24 NOTE — Progress Notes (Signed)
 Neurosurgery Interim Progress Note    Patient:  Michelle Bright      Unit/Bed:6K-03/003-A    Date of Birth: 03/08/1950    MRN: [id number]     Acct: [id number]     Admit date: 12/23/2022    Chief Complaint   Patient presents with    Fall       Patient Seen

## 2022-12-24 NOTE — Care Coordination-Inpatient (Signed)
 12/24/22 1020   Service Assessment   Patient Orientation Alert and Oriented   Cognition Alert   History Provided By Patient   Primary Caregiver Self   Accompanied By/Relationship unaccompanied   Support Systems Spouse/Significant Other;Children   Patien

## 2022-12-24 NOTE — Consults (Signed)
 [facility], [place], [place]                                          NEUROSURGICAL CONSULTATION NOTE       Michelle Bright   Date of Birth: 1950/12/05  Account Number: [id number]   Date of Examination: 12/26/2022    ASSESSMENT:    Sacral fractu

## 2022-12-24 NOTE — Other (Signed)
 RT Inhaler-Nebulizer Bronchodilator Protocol Note    There is a bronchodilator order in the chart from a provider indicating to follow the RT Bronchodilator Protocol and there is an "Initiate RT Inhaler-Nebulizer Bronchodilator Protocol" order as well (see

## 2022-12-24 NOTE — Plan of Care (Signed)
 Problem: Chronic Conditions and Co-morbidities  Goal: Patient's chronic conditions and co-morbidity symptoms are monitored and maintained or improved  Outcome: Progressing  Flowsheets (Taken 12/24/2022 0249)  Care Plan - Patient's Chronic Conditions and

## 2022-12-24 NOTE — Progress Notes (Signed)
 Brief Intervention and Referral to Treatment Summary    Patient was provided PHQ-9, AUDIT-C and DAST Screening:      PHQ-9 Score: 0  AUDIT-C Score:  0  DAST Score:  0    Patient's substance use is considered     N/A      Patient's depression is considered:

## 2022-12-25 LAB — ANION GAP
Anion Gap: 10 meq/L (ref 8.0–16.0)
Anion Gap: 11 meq/L (ref 8.0–16.0)

## 2022-12-25 LAB — CBC
Hematocrit: 41.3 % (ref 37.0–47.0)
Hemoglobin: 12.5 g/dL (ref 12.0–16.0)
MCH: 32.9 pg (ref 26.0–33.0)
MCHC: 30.3 g/dL — ABNORMAL LOW (ref 32.2–35.5)
MCV: 108.7 fL — ABNORMAL HIGH (ref 81.0–99.0)
MPV: 9.8 fL (ref 9.4–12.4)
Platelets: 182 10*3/uL (ref 130–400)
RBC: 3.8 10*6/uL — ABNORMAL LOW (ref 4.20–5.40)
RDW-CV: 14.4 % (ref 11.5–14.5)
RDW-SD: 58 fL — ABNORMAL HIGH (ref 35.0–45.0)
WBC: 11.3 10*3/uL — ABNORMAL HIGH (ref 4.8–10.8)

## 2022-12-25 LAB — POCT GLUCOSE
POC Glucose: 119 mg/dL — ABNORMAL HIGH (ref 70–108)
POC Glucose: 130 mg/dL — ABNORMAL HIGH (ref 70–108)
POC Glucose: 135 mg/dL — ABNORMAL HIGH (ref 70–108)
POC Glucose: 167 mg/dL — ABNORMAL HIGH (ref 70–108)

## 2022-12-25 LAB — BASIC METABOLIC PANEL
BUN: 37 mg/dL — ABNORMAL HIGH (ref 7–22)
BUN: 39 mg/dL — ABNORMAL HIGH (ref 7–22)
CO2: 18 meq/L — ABNORMAL LOW (ref 23–33)
CO2: 20 meq/L — ABNORMAL LOW (ref 23–33)
Calcium: 8.8 mg/dL (ref 8.5–10.5)
Calcium: 8.8 mg/dL (ref 8.5–10.5)
Chloride: 105 meq/L (ref 98–111)
Chloride: 105 meq/L (ref 98–111)
Creatinine: 0.9 mg/dL (ref 0.4–1.2)
Creatinine: 1.1 mg/dL (ref 0.4–1.2)
Glucose: 106 mg/dL (ref 70–108)
Glucose: 107 mg/dL (ref 70–108)
Potassium: 5.9 meq/L — ABNORMAL HIGH (ref 3.5–5.2)
Potassium: 6 meq/L (ref 3.5–5.2)
Sodium: 133 meq/L — ABNORMAL LOW (ref 135–145)
Sodium: 136 meq/L (ref 135–145)

## 2022-12-25 LAB — GLOMERULAR FILTRATION RATE, ESTIMATED
Est, Glom Filt Rate: 67 mL/min/{1.73_m2} (ref >60)
Est, Glom Filt Rate: 53 mL/min/{1.73_m2} — AB (ref >60)

## 2022-12-25 LAB — MAGNESIUM: Magnesium: 1.9 mg/dL (ref 1.6–2.4)

## 2022-12-25 LAB — PROTIME-INR: INR: 1.29 — ABNORMAL HIGH (ref 0.85–1.13)

## 2022-12-25 MED ORDER — HEPARIN SOD (PORCINE) IN D5W 100 UNIT/ML IV SOLN
100 UNIT/ML | INTRAVENOUS | Status: AC
Start: 2022-12-25 — End: 2022-12-29
  Administered 2022-12-25: 18:00:00 12 [IU]/kg/h via INTRAVENOUS
  Administered 2022-12-26: 09:00:00 14 [IU]/kg/h via INTRAVENOUS
  Administered 2022-12-27: 10:00:00 12 [IU]/kg/h via INTRAVENOUS
  Administered 2022-12-29: 15:00:00 16 [IU]/kg/h via INTRAVENOUS
  Administered 2022-12-29: 04:00:00 14 [IU]/kg/h via INTRAVENOUS

## 2022-12-25 MED ORDER — DEXTROSE 10 % IV SOLN
10 | INTRAVENOUS | Status: DC | PRN
Start: 2022-12-25 — End: 2022-12-25

## 2022-12-25 MED ORDER — FUROSEMIDE 10 MG/ML IJ SOLN
10 | Freq: Once | INTRAMUSCULAR | Status: AC
Start: 2022-12-25 — End: 2022-12-25
  Administered 2022-12-25: 20:00:00 60 mg via INTRAVENOUS

## 2022-12-25 MED ORDER — DEXTROSE 10 % IV BOLUS
Freq: Once | INTRAVENOUS | Status: AC
Start: 2022-12-25 — End: 2022-12-25
  Administered 2022-12-25: 20:00:00 250 mL via INTRAVENOUS

## 2022-12-25 MED ORDER — INSULIN REGULAR HUMAN 100 UNIT/ML IJ SOLN
100 | Freq: Once | INTRAMUSCULAR | Status: AC
Start: 2022-12-25 — End: 2022-12-25
  Administered 2022-12-25: 20:00:00 10 [IU] via INTRAVENOUS

## 2022-12-25 MED ORDER — HEPARIN SODIUM (PORCINE) 1000 UNIT/ML IJ SOLN
1000 | Freq: Once | INTRAMUSCULAR | Status: AC
Start: 2022-12-25 — End: 2022-12-25
  Administered 2022-12-25: 18:00:00 3000 [IU]/kg via INTRAVENOUS

## 2022-12-25 MED ORDER — CALCIUM GLUCONATE-NACL 1-0.675 GM/50ML-% IV SOLN
Freq: Once | INTRAVENOUS | Status: AC
Start: 2022-12-25 — End: 2022-12-25
  Administered 2022-12-25: 20:00:00 1000 mg via INTRAVENOUS

## 2022-12-25 MED ORDER — DEXTROSE 10 % IV BOLUS
INTRAVENOUS | Status: DC | PRN
Start: 2022-12-25 — End: 2022-12-25

## 2022-12-25 MED ORDER — HEPARIN SODIUM (PORCINE) 1000 UNIT/ML IJ SOLN
1000 UNIT/ML | INTRAMUSCULAR | Status: DC | PRN
Start: 2022-12-25 — End: 2022-12-29
  Administered 2022-12-26 – 2022-12-29 (×3): 1500 [IU]/kg via INTRAVENOUS

## 2022-12-25 MED ORDER — SODIUM CHLORIDE 0.9 % IV SOLN
0.9 | INTRAVENOUS | Status: AC
Start: 2022-12-25 — End: 2022-12-26
  Administered 2022-12-25: 20:00:00 via INTRAVENOUS

## 2022-12-25 MED ORDER — HEPARIN SODIUM (PORCINE) 1000 UNIT/ML IJ SOLN
1000 UNIT/ML | INTRAMUSCULAR | Status: DC | PRN
Start: 2022-12-25 — End: 2022-12-29

## 2022-12-25 MED ORDER — SODIUM ZIRCONIUM CYCLOSILICATE 5 G PO PACK
5 | Freq: Three times a day (TID) | ORAL | Status: AC
Start: 2022-12-25 — End: 2022-12-25
  Administered 2022-12-25 (×2): 5 g via ORAL

## 2022-12-25 MED ORDER — GLUCAGON HCL (DIAGNOSTIC) 1 MG IJ SOLR
1 | INTRAMUSCULAR | Status: DC | PRN
Start: 2022-12-25 — End: 2022-12-25

## 2022-12-25 MED ORDER — GLUCOSE 4 G PO CHEW
4 | ORAL | Status: DC | PRN
Start: 2022-12-25 — End: 2022-12-25

## 2022-12-25 MED FILL — LIDOCARE ARM/NECK/LEG 4 % EX PTCH: 4 % | CUTANEOUS | Qty: 3

## 2022-12-25 MED FILL — METOPROLOL SUCCINATE ER 25 MG PO TB24: 25 MG | ORAL | Qty: 1

## 2022-12-25 MED FILL — HUMULIN R 100 UNIT/ML IJ SOLN: 100 UNIT/ML | INTRAMUSCULAR | Qty: 10

## 2022-12-25 MED FILL — MORPHINE SULFATE 2 MG/ML IJ SOLN: 2 mg/mL | INTRAMUSCULAR | Qty: 1

## 2022-12-25 MED FILL — ACETAMINOPHEN 325 MG PO TABS: 325 MG | ORAL | Qty: 2

## 2022-12-25 MED FILL — HEPARIN SODIUM (PORCINE) 1000 UNIT/ML IJ SOLN: 1000 UNIT/ML | INTRAMUSCULAR | Qty: 10

## 2022-12-25 MED FILL — LOKELMA 5 G PO PACK: 5 g | ORAL | Qty: 1

## 2022-12-25 MED FILL — FUROSEMIDE 10 MG/ML IJ SOLN: 10 MG/ML | INTRAMUSCULAR | Qty: 8

## 2022-12-25 MED FILL — SYNTHROID 125 MCG PO TABS: 125 MCG | ORAL | Qty: 1

## 2022-12-25 MED FILL — AMIODARONE HCL 200 MG PO TABS: 200 MG | ORAL | Qty: 1

## 2022-12-25 MED FILL — CEFTRIAXONE SODIUM 2 G IJ SOLR: 2 g | INTRAMUSCULAR | Qty: 2000

## 2022-12-25 MED FILL — CALCIUM GLUCONATE-NACL 1-0.675 GM/50ML-% IV SOLN: INTRAVENOUS | Qty: 50

## 2022-12-25 MED FILL — HEPARIN SOD (PORCINE) IN D5W 100 UNIT/ML IV SOLN: 100 UNIT/ML | INTRAVENOUS | Qty: 250

## 2022-12-25 NOTE — Progress Notes (Addendum)
 Neurosurgery Progress Note    Patient:  Michelle Bright      Unit/Bed:6K-03/003-A    Date of Birth: [birth year]    MRN: [id number]     Acct: [id number]     Admit date: 12/23/2022    Chief Complaint   Patient presents with    Fall       Patient Seen, Chart,

## 2022-12-25 NOTE — Progress Notes (Signed)
 Hospitalist Progress Note      Patient:  Michelle Bright    Unit/Bed:6K-03/003-A  Date of Birth: 02/20/1950  MRN: [id number]   Acct: [id number]   PCP: Elton Sin, MD  Date of A

## 2022-12-25 NOTE — Plan of Care (Signed)
 Problem: Chronic Conditions and Co-morbidities  Goal: Patient's chronic conditions and co-morbidity symptoms are monitored and maintained or improved  Outcome: Progressing  Note: At baseline.     Problem: Discharge Planning  Goal: Discharge to home or ot

## 2022-12-25 NOTE — Progress Notes (Signed)
 Provider notified about loss of IV access causing re-timing of patient's q24 Rocephin. Also, informed her about patient's potassium level this morning of 5.9.

## 2022-12-25 NOTE — Plan of Care (Signed)
 Problem: Chronic Conditions and Co-morbidities  Goal: Patient's chronic conditions and co-morbidity symptoms are monitored and maintained or improved  12/25/2022 0140 by Junius Finner, RN  Outcome: Progressing  Flowsheets (Taken 12/25/2022 0140)  Care Pla

## 2022-12-25 NOTE — Plan of Care (Signed)
 Problem: Chronic Conditions and Co-morbidities  Goal: Patient's chronic conditions and co-morbidity symptoms are monitored and maintained or improved  12/25/2022 2154 by Amado Coe, RN  Outcome: Progressing  Flowsheets (Taken 12/25/2022 0140 by Susa Day

## 2022-12-26 ENCOUNTER — Inpatient Hospital Stay: Admit: 2022-12-26 | Payer: MEDICARE | Primary: Family Medicine

## 2022-12-26 LAB — BASIC METABOLIC PANEL
BUN: 39 mg/dL — ABNORMAL HIGH (ref 7–22)
BUN: 44 mg/dL — ABNORMAL HIGH (ref 7–22)
CO2: 21 meq/L — ABNORMAL LOW (ref 23–33)
CO2: 21 meq/L — ABNORMAL LOW (ref 23–33)
Calcium: 8.5 mg/dL (ref 8.5–10.5)
Calcium: 8.7 mg/dL (ref 8.5–10.5)
Chloride: 102 meq/L (ref 98–111)
Chloride: 103 meq/L (ref 98–111)
Creatinine: 1.1 mg/dL (ref 0.4–1.2)
Creatinine: 1.2 mg/dL (ref 0.4–1.2)
Glucose: 102 mg/dL (ref 70–108)
Glucose: 108 mg/dL (ref 70–108)
Potassium: 4.8 meq/L (ref 3.5–5.2)
Potassium: 5.1 meq/L (ref 3.5–5.2)
Sodium: 136 meq/L (ref 135–145)
Sodium: 137 meq/L (ref 135–145)

## 2022-12-26 LAB — ANTI-XA, UNFRACTIONATED HEPARIN
Heparin Unfractionated: 0.2 U/mL — ABNORMAL LOW (ref 0.30–0.70)
Heparin Unfractionated: 0.4 U/mL (ref 0.30–0.70)
Heparin Unfractionated: 0.65 U/mL (ref 0.30–0.70)

## 2022-12-26 LAB — CBC
Hematocrit: 38.1 % (ref 37.0–47.0)
Hemoglobin: 11.4 g/dL — ABNORMAL LOW (ref 12.0–16.0)
MCH: 33.1 pg — ABNORMAL HIGH (ref 26.0–33.0)
MCHC: 29.9 g/dL — ABNORMAL LOW (ref 32.2–35.5)
MCV: 110.8 fL — ABNORMAL HIGH (ref 81.0–99.0)
MPV: 10.1 fL (ref 9.4–12.4)
Platelets: 202 10*3/uL (ref 130–400)
RBC: 3.44 10*6/uL — ABNORMAL LOW (ref 4.20–5.40)
RDW-CV: 14.4 % (ref 11.5–14.5)
RDW-SD: 59.1 fL — ABNORMAL HIGH (ref 35.0–45.0)
WBC: 10.4 10*3/uL (ref 4.8–10.8)

## 2022-12-26 LAB — POCT GLUCOSE
POC Glucose: 118 mg/dL — ABNORMAL HIGH (ref 70–108)
POC Glucose: 99 mg/dL (ref 70–108)
POC Glucose: 157 mg/dL — ABNORMAL HIGH (ref 70–108)

## 2022-12-26 LAB — MAGNESIUM: Magnesium: 1.6 mg/dL (ref 1.6–2.4)

## 2022-12-26 LAB — GLOMERULAR FILTRATION RATE, ESTIMATED
Est, Glom Filt Rate: 47 mL/min/{1.73_m2} — AB (ref >60)
Est, Glom Filt Rate: 53 mL/min/{1.73_m2} — AB (ref >60)

## 2022-12-26 LAB — ANION GAP
Anion Gap: 13 meq/L (ref 8.0–16.0)
Anion Gap: 13 meq/L (ref 8.0–16.0)

## 2022-12-26 LAB — PROTIME-INR: INR: 1.23 — ABNORMAL HIGH (ref 0.85–1.13)

## 2022-12-26 MED ORDER — NORMAL SALINE FLUSH 0.9 % IV SOLN
0.9 | INTRAVENOUS | Status: DC | PRN
Start: 2022-12-26 — End: 2022-12-27

## 2022-12-26 MED ORDER — MORPHINE SULFATE (PF) 2 MG/ML IV SOLN
2 | INTRAVENOUS | Status: DC | PRN
Start: 2022-12-26 — End: 2022-12-28
  Administered 2022-12-28: 05:00:00 2 mg via INTRAVENOUS

## 2022-12-26 MED ORDER — PROPOFOL 200 MG/20ML IV EMUL
200 | INTRAVENOUS | Status: AC
Start: 2022-12-26 — End: ?

## 2022-12-26 MED ORDER — STERILE WATER FOR INJECTION (MIXTURES ONLY)
1 g | INTRAMUSCULAR | Status: AC
Start: 2022-12-26 — End: 2022-12-27
  Administered 2022-12-26: 17:00:00 2000 mg via INTRAVENOUS
  Administered 2022-12-27: 06:00:00 1000 mg via INTRAVENOUS

## 2022-12-26 MED ORDER — HYDROMORPHONE HCL 1 MG/ML IJ SOLN
1 | INTRAMUSCULAR | Status: AC
Start: 2022-12-26 — End: 2022-12-26
  Administered 2022-12-26: 18:00:00 0.5 via INTRAVENOUS

## 2022-12-26 MED ORDER — SODIUM ZIRCONIUM CYCLOSILICATE 5 G PO PACK
5 | Freq: Three times a day (TID) | ORAL | Status: AC
Start: 2022-12-26 — End: 2022-12-27
  Administered 2022-12-27: 02:00:00 5 g via ORAL

## 2022-12-26 MED ORDER — ONDANSETRON HCL 4 MG/2ML IJ SOLN
4 | INTRAMUSCULAR | Status: AC
Start: 2022-12-26 — End: ?

## 2022-12-26 MED ORDER — SODIUM CHLORIDE 0.9 % IV SOLN
0.9 | INTRAVENOUS | Status: DC | PRN
Start: 2022-12-26 — End: 2022-12-26
  Administered 2022-12-26: 16:00:00 via INTRAVENOUS

## 2022-12-26 MED ORDER — MORPHINE SULFATE (PF) 4 MG/ML IV SOLN
4 | INTRAVENOUS | Status: DC | PRN
Start: 2022-12-26 — End: 2022-12-28
  Administered 2022-12-27 – 2022-12-28 (×2): 4 mg via INTRAVENOUS

## 2022-12-26 MED ORDER — VASOPRESSIN 20 UNIT/ML IV SOLN
20 | Freq: Once | INTRAVENOUS | Status: DC | PRN
Start: 2022-12-26 — End: 2022-12-26
  Administered 2022-12-26: 18:00:00 2 via INTRAVENOUS

## 2022-12-26 MED ORDER — HYDROMORPHONE HCL 1 MG/ML IJ SOLN
1 | INTRAMUSCULAR | Status: DC | PRN
Start: 2022-12-26 — End: 2022-12-26
  Administered 2022-12-26: 18:00:00 0.5 mg via INTRAVENOUS

## 2022-12-26 MED ORDER — LIDOCAINE HCL (CARDIAC) 100 MG/5ML IV SOSY
100 | INTRAVENOUS | Status: AC
Start: 2022-12-26 — End: ?

## 2022-12-26 MED ORDER — LIDOCAINE HCL (CARDIAC) 100 MG/5ML IV SOSY
100 | Freq: Once | INTRAVENOUS | Status: DC | PRN
Start: 2022-12-26 — End: 2022-12-26
  Administered 2022-12-26: 16:00:00 50 via INTRAVENOUS

## 2022-12-26 MED ORDER — ONDANSETRON HCL 4 MG/2ML IJ SOLN
4 | Freq: Once | INTRAMUSCULAR | Status: DC | PRN
Start: 2022-12-26 — End: 2022-12-26
  Administered 2022-12-26: 18:00:00 4 via INTRAVENOUS

## 2022-12-26 MED ORDER — PROPOFOL 200 MG/20ML IV EMUL
200 | Freq: Once | INTRAVENOUS | Status: DC | PRN
Start: 2022-12-26 — End: 2022-12-26
  Administered 2022-12-26: 16:00:00 90 via INTRAVENOUS

## 2022-12-26 MED ORDER — DEXAMETHASONE SOD PHOSPHATE PF 10 MG/ML IJ SOSY
10 | Freq: Once | INTRAMUSCULAR | Status: DC | PRN
Start: 2022-12-26 — End: 2022-12-26
  Administered 2022-12-26: 17:00:00 8 via INTRAVENOUS

## 2022-12-26 MED ORDER — PHENYLEPHRINE HCL 1 MG/10ML IV SOSY
1 | INTRAVENOUS | Status: AC
Start: 2022-12-26 — End: ?

## 2022-12-26 MED ORDER — SUGAMMADEX SODIUM 200 MG/2ML IV SOLN
200 | Freq: Once | INTRAVENOUS | Status: DC | PRN
Start: 2022-12-26 — End: 2022-12-26
  Administered 2022-12-26: 18:00:00 100 via INTRAVENOUS

## 2022-12-26 MED ORDER — SUGAMMADEX SODIUM 200 MG/2ML IV SOLN
200 | INTRAVENOUS | Status: AC
Start: 2022-12-26 — End: ?

## 2022-12-26 MED ORDER — SODIUM CHLORIDE 0.9 % IV SOLN
0.9 | INTRAVENOUS | Status: DC | PRN
Start: 2022-12-26 — End: 2022-12-27

## 2022-12-26 MED ORDER — DEXAMETHASONE SOD PHOSPHATE PF 10 MG/ML IJ SOSY
10 | INTRAMUSCULAR | Status: AC
Start: 2022-12-26 — End: ?

## 2022-12-26 MED ORDER — NORMAL SALINE FLUSH 0.9 % IV SOLN
0.9 | Freq: Two times a day (BID) | INTRAVENOUS | Status: DC
Start: 2022-12-26 — End: 2022-12-27
  Administered 2022-12-27: 14:00:00 10 mL via INTRAVENOUS

## 2022-12-26 MED ORDER — CULTURELLE PO CAPS
Freq: Every day | ORAL | Status: DC
Start: 2022-12-26 — End: 2022-12-30
  Administered 2022-12-27 – 2022-12-30 (×4): 1 via ORAL

## 2022-12-26 MED ORDER — VASOPRESSIN 20 UNIT/ML IV SOLN
20 | INTRAVENOUS | Status: AC
Start: 2022-12-26 — End: ?

## 2022-12-26 MED ORDER — PHENYLEPHRINE HCL 1 MG/10ML IV SOSY
1 | Freq: Once | INTRAVENOUS | Status: DC | PRN
Start: 2022-12-26 — End: 2022-12-26
  Administered 2022-12-26 (×4): 200 via INTRAVENOUS
  Administered 2022-12-26: 17:00:00 100 via INTRAVENOUS
  Administered 2022-12-26 (×2): 200 via INTRAVENOUS
  Administered 2022-12-26: 17:00:00 100 via INTRAVENOUS
  Administered 2022-12-26: 17:00:00 200 via INTRAVENOUS

## 2022-12-26 MED ORDER — HYDROCODONE-ACETAMINOPHEN 5-325 MG PO TABS
5-325 | Freq: Four times a day (QID) | ORAL | Status: DC | PRN
Start: 2022-12-26 — End: 2022-12-28
  Administered 2022-12-26 – 2022-12-28 (×6): 1 via ORAL

## 2022-12-26 MED ORDER — ROCURONIUM BROMIDE 50 MG/5ML IV SOLN
50 | Freq: Once | INTRAVENOUS | Status: DC | PRN
Start: 2022-12-26 — End: 2022-12-26
  Administered 2022-12-26: 16:00:00 35 via INTRAVENOUS

## 2022-12-26 MED FILL — HEPARIN SODIUM (PORCINE) 1000 UNIT/ML IJ SOLN: 1000 UNIT/ML | INTRAMUSCULAR | Qty: 10

## 2022-12-26 MED FILL — CEFTRIAXONE SODIUM 2 G IJ SOLR: 2 g | INTRAMUSCULAR | Qty: 2000

## 2022-12-26 MED FILL — MORPHINE SULFATE 2 MG/ML IJ SOLN: 2 mg/mL | INTRAMUSCULAR | Qty: 1

## 2022-12-26 MED FILL — LIDOCAINE HCL (CARDIAC) 100 MG/5ML IV SOSY: 100 MG/5ML | INTRAVENOUS | Qty: 5

## 2022-12-26 MED FILL — DEXAMETHASONE SOD PHOSPHATE PF 10 MG/ML IJ SOSY: 10 MG/ML | INTRAMUSCULAR | Qty: 1

## 2022-12-26 MED FILL — HYDROMORPHONE HCL 1 MG/ML IJ SOLN: 1 MG/ML | INTRAMUSCULAR | Qty: 1

## 2022-12-26 MED FILL — ACETAMINOPHEN 325 MG PO TABS: 325 MG | ORAL | Qty: 2

## 2022-12-26 MED FILL — PHENYLEPHRINE HCL 1 MG/10ML IV SOSY: 1 MG/0ML | INTRAVENOUS | Qty: 10

## 2022-12-26 MED FILL — ONDANSETRON HCL 4 MG/2ML IJ SOLN: 4 MG/2ML | INTRAMUSCULAR | Qty: 2

## 2022-12-26 MED FILL — BRIDION 200 MG/2ML IV SOLN: 200 MG/2ML | INTRAVENOUS | Qty: 2

## 2022-12-26 MED FILL — LOKELMA 5 G PO PACK: 5 g | ORAL | Qty: 1

## 2022-12-26 MED FILL — DIPRIVAN 200 MG/20ML IV EMUL: 200 MG/20ML | INTRAVENOUS | Qty: 20

## 2022-12-26 MED FILL — VASOSTRICT 20 UNIT/ML IV SOLN: 20 UNIT/ML | INTRAVENOUS | Qty: 2

## 2022-12-26 MED FILL — HYDROCODONE-ACETAMINOPHEN 5-325 MG PO TABS: 5-325 MG | ORAL | Qty: 1

## 2022-12-26 MED FILL — ONDANSETRON 4 MG PO TBDP: 4 MG | ORAL | Qty: 1

## 2022-12-26 NOTE — Progress Notes (Signed)
 Pt admitted to  7K4 from PACU.     Complaints: FALL.      IV normal saline infusing into the hand right, condition patent and no redness at a rate of 20 mls/ hour with about 200 mls in the bag still. IV site free of s/s of infection or infiltration. Vital

## 2022-12-26 NOTE — Op Note (Signed)
 [facility]  RECORD OF OPERATION    Patient name: Michelle Bright  Medical Record Number: [id number]  Account Number: [id number]      Date of Procedure: 12/26/2022    Pre-operative Diagnosis:       Acute pathologic(osteoporotic) compression f

## 2022-12-26 NOTE — Progress Notes (Signed)
 Physician Progress Note      PATIENT:               Michelle Bright, Michelle Bright  CSN #:                  [id number]  DOB:                       [birth year]  ADMIT DATE:       12/23/2022 3:27 PM  DISCH DATE:  RESPONDING  PROVIDER #:        Jayce Kainz DO          QUER

## 2022-12-26 NOTE — Progress Notes (Signed)
 Called report to [place], Charity fundraiser, on 7K.

## 2022-12-26 NOTE — Progress Notes (Signed)
 Patient received sacramental anointing by a priest. If you are in need of chaplain support, please call [phone]. If you are in need of a chaplain after 6:30pm, please call the house supervisor for the on-call chaplain.      Zenia Resides  Spiritual

## 2022-12-26 NOTE — Progress Notes (Signed)
 1300 Patient arrived to PACU. Patient arouses to voice and follows commands. Back incisions x3 CDI. Ice pack applied. Respirations even and unlabored. VSS.    1305 Greggory Stallion, PA at bedside to assess the patient. Greggory Stallion going to update family with new patient

## 2022-12-26 NOTE — Anesthesia Post-Procedure Evaluation (Signed)
 Department of Anesthesiology  Postprocedure Note    Patient: Michelle Bright  MRN: [id number]  Birthdate: Jul 21, 1950  Date of evaluation: 12/26/2022    Procedure Summary       Date: 12/26/22 Room / Location: [facility] OR 03 / [facility] OR    Anesthesia Start: 1123 Ane

## 2022-12-26 NOTE — Care Coordination-Inpatient (Signed)
 12/26/22, 3:02 PM EST    DISCHARGE ON GOING EVALUATION    Michelle Bright day: 3  Location: 7K-04/004-A Reason for admit: Hyperkalemia [E87.5]  AKI (acute kidney injury) (HCC) [N17.9]  Fall, initial encounter [W19.XXXA]  Closed fracture of sa

## 2022-12-26 NOTE — Anesthesia Pre-Procedure Evaluation (Signed)
 Department of Anesthesiology  Preprocedure Note       Name:  Michelle Bright   Age:  72 y.o.  DOB:  1950/11/07                                          MRN:  [id number]         Date:  12/26/2022      Surgeon: Moishe Spice):  Reyne Dumas, MD    Procedure: Proced

## 2022-12-26 NOTE — Progress Notes (Signed)
 Patient was seen and examined in the pre op area in conjunction with neurosurgery PA.   There are no changes in patient symptoms and neurological exam since yesterday.   Today I discussed with patient again today planned/recommended surgical intervention a

## 2022-12-26 NOTE — Progress Notes (Signed)
 [facility]  OCCUPATIONAL THERAPY MISSED TREATMENT NOTE  [facility] 7K  7K-04/004-A      Date: 12/26/2022  Patient Name: Michelle Bright        CSN: [id number]   DOB: [birth year]  (72 y.o.)  Gender: female   Referring Practitioner: Greggory Stallion

## 2022-12-26 NOTE — Progress Notes (Addendum)
 Hospitalist Progress Note      Patient:  Michelle Bright    Unit/Bed:[facility] OR (General) POOL R*  Date of Birth: 24-Mar-1950  MRN: [id number]   Acct: [id number]   PCP: Elton Sin,

## 2022-12-26 NOTE — Progress Notes (Signed)
 [facility]  PHYSICAL THERAPY MISSED TREATMENT NOTE  [facility] RENAL TELEMETRY 6K    Date: 12/26/2022  Patient Name: Michelle Bright        MRN: [id number]   DOB: [birth year]  (72 y.o.)  Gender: female                REASON FOR MISSED TREATMENT:  Dennie Bible

## 2022-12-26 NOTE — Brief Op Note (Signed)
 Brief Postoperative Note      Patient: Michelle Bright  Date of Birth: [birth year]  MRN: [id number]    Date of Procedure: 2023-01-12    Pre-Op Diagnosis Codes:      * Hyperkalemia [E87.5]    Post-Op Diagnosis: Same       Procedure(s):  KYPHOPLASTY S1-2 + T5

## 2022-12-27 LAB — ANTI-XA, UNFRACTIONATED HEPARIN
Heparin Unfractionated: <0.04 U/mL — ABNORMAL LOW (ref 0.30–0.70)
Heparin Unfractionated: <0.04 U/mL — ABNORMAL LOW (ref 0.30–0.70)
Heparin Unfractionated: 0.88 U/mL — ABNORMAL HIGH (ref 0.30–0.70)
Heparin Unfractionated: 0.34 U/mL (ref 0.30–0.70)

## 2022-12-27 LAB — SODIUM, URINE, RANDOM: Sodium, Ur: 94 meq/L

## 2022-12-27 LAB — BASIC METABOLIC PANEL
BUN: 47 mg/dL — ABNORMAL HIGH (ref 7–22)
BUN: 48 mg/dL — ABNORMAL HIGH (ref 7–22)
CO2: 19 meq/L — ABNORMAL LOW (ref 23–33)
CO2: 19 meq/L — ABNORMAL LOW (ref 23–33)
Calcium: 8.4 mg/dL — ABNORMAL LOW (ref 8.5–10.5)
Calcium: 8.3 mg/dL — ABNORMAL LOW (ref 8.5–10.5)
Chloride: 103 meq/L (ref 98–111)
Chloride: 103 meq/L (ref 98–111)
Creatinine: 1.4 mg/dL — ABNORMAL HIGH (ref 0.4–1.2)
Creatinine: 1.3 mg/dL — ABNORMAL HIGH (ref 0.4–1.2)
Glucose: 172 mg/dL — ABNORMAL HIGH (ref 70–108)
Glucose: 210 mg/dL — ABNORMAL HIGH (ref 70–108)
Potassium: 4.9 meq/L (ref 3.5–5.2)
Potassium: 4.8 meq/L (ref 3.5–5.2)
Sodium: 137 meq/L (ref 135–145)
Sodium: 137 meq/L (ref 135–145)

## 2022-12-27 LAB — CBC WITH AUTO DIFFERENTIAL
Basophils Absolute: 0 10*3/uL (ref 0.0–0.1)
Basophils: 0.1 %
Eosinophils Absolute: 0 10*3/uL (ref 0.0–0.4)
Eosinophils: 0 %
Hematocrit: 33.2 % — ABNORMAL LOW (ref 37.0–47.0)
Hemoglobin: 10.5 g/dL — ABNORMAL LOW (ref 12.0–16.0)
Immature Grans (Abs): 0.19 10*3/uL — ABNORMAL HIGH (ref 0.00–0.07)
Immature Granulocytes %: 2.2 %
Lymphocytes Absolute: 0.6 10*3/uL — ABNORMAL LOW (ref 1.0–4.8)
Lymphocytes: 6.5 %
MCH: 32.2 pg (ref 26.0–33.0)
MCHC: 31.6 g/dL — ABNORMAL LOW (ref 32.2–35.5)
MCV: 101.8 fL — ABNORMAL HIGH (ref 81.0–99.0)
MPV: 10.2 fL (ref 9.4–12.4)
Monocytes %: 3 %
Monocytes Absolute: 0.3 10*3/uL — ABNORMAL LOW (ref 0.4–1.3)
Neutrophils Absolute: 7.6 10*3/uL (ref 1.8–7.7)
Platelets: 227 10*3/uL (ref 130–400)
RBC: 3.26 10*6/uL — ABNORMAL LOW (ref 4.20–5.40)
RDW-CV: 14.2 % (ref 11.5–14.5)
RDW-SD: 52.9 fL — ABNORMAL HIGH (ref 35.0–45.0)
Seg Neutrophils: 88.2 %
WBC: 8.6 10*3/uL (ref 4.8–10.8)
nRBC: 0 /100{WBCs}

## 2022-12-27 LAB — POCT GLUCOSE
POC Glucose: 209 mg/dL — ABNORMAL HIGH (ref 70–108)
POC Glucose: 216 mg/dL — ABNORMAL HIGH (ref 70–108)

## 2022-12-27 LAB — MAGNESIUM: Magnesium: 1.6 mg/dL (ref 1.6–2.4)

## 2022-12-27 LAB — GLOMERULAR FILTRATION RATE, ESTIMATED
Est, Glom Filt Rate: 39 mL/min/{1.73_m2} — AB (ref >60)
Est, Glom Filt Rate: 43 mL/min/{1.73_m2} — AB (ref >60)

## 2022-12-27 LAB — UREA NITROGEN, URINE: Urea Nitrogen, Ur: 1122 mg/dL

## 2022-12-27 LAB — PROTIME-INR: INR: 1.35 — ABNORMAL HIGH (ref 0.85–1.13)

## 2022-12-27 LAB — ANION GAP
Anion Gap: 15 meq/L (ref 8.0–16.0)
Anion Gap: 15 meq/L (ref 8.0–16.0)

## 2022-12-27 LAB — CREATININE, RANDOM URINE: Creatinine, Ur: 86.3 mg/dL

## 2022-12-27 MED ORDER — SODIUM CHLORIDE 0.9 % IV SOLN
0.9 | INTRAVENOUS | Status: AC
Start: 2022-12-27 — End: 2022-12-28
  Administered 2022-12-27 – 2022-12-28 (×2): via INTRAVENOUS

## 2022-12-27 MED ORDER — DEXTROSE 10 % IV BOLUS
INTRAVENOUS | Status: DC | PRN
Start: 2022-12-27 — End: 2022-12-30

## 2022-12-27 MED ORDER — GLUCAGON EMERGENCY 1 MG IJ KIT
1 | INTRAMUSCULAR | Status: DC | PRN
Start: 2022-12-27 — End: 2022-12-30

## 2022-12-27 MED ORDER — INSULIN GLARGINE 100 UNIT/ML SC SOLN
100 | Freq: Every evening | SUBCUTANEOUS | Status: DC
Start: 2022-12-27 — End: 2022-12-30
  Administered 2022-12-29 – 2022-12-30 (×2): 4 [IU] via SUBCUTANEOUS

## 2022-12-27 MED ORDER — INSULIN LISPRO 100 UNIT/ML IJ SOLN
100 UNIT/ML | Freq: Four times a day (QID) | INTRAMUSCULAR | Status: AC
Start: 2022-12-27 — End: 2022-12-30
  Administered 2022-12-27: 22:00:00 2 [IU] via SUBCUTANEOUS

## 2022-12-27 MED ORDER — CYCLOBENZAPRINE HCL 10 MG PO TABS
10 | Freq: Three times a day (TID) | ORAL | Status: DC | PRN
Start: 2022-12-27 — End: 2022-12-30
  Administered 2022-12-27 – 2022-12-29 (×4): 5 mg via ORAL

## 2022-12-27 MED ORDER — SODIUM CHLORIDE 0.9 % IV SOLN
0.9 | INTRAVENOUS | Status: DC
Start: 2022-12-27 — End: 2022-12-27
  Administered 2022-12-27: 16:00:00 via INTRAVENOUS

## 2022-12-27 MED ORDER — GLUCOSE 4 G PO CHEW
4 | ORAL | Status: DC | PRN
Start: 2022-12-27 — End: 2022-12-30

## 2022-12-27 MED ORDER — DEXTROSE 10 % IV SOLN
10 | INTRAVENOUS | Status: DC | PRN
Start: 2022-12-27 — End: 2022-12-30

## 2022-12-27 MED ORDER — HEPARIN SODIUM (PORCINE) 1000 UNIT/ML IJ SOLN
1000 | Freq: Once | INTRAMUSCULAR | Status: AC
Start: 2022-12-27 — End: 2022-12-27
  Administered 2022-12-27: 10:00:00 3000 [IU] via INTRAVENOUS

## 2022-12-27 MED FILL — HYDROCODONE-ACETAMINOPHEN 5-325 MG PO TABS: 5-325 MG | ORAL | Qty: 1

## 2022-12-27 MED FILL — HEPARIN SOD (PORCINE) IN D5W 100 UNIT/ML IV SOLN: 100 UNIT/ML | INTRAVENOUS | Qty: 250

## 2022-12-27 MED FILL — CYCLOBENZAPRINE HCL 10 MG PO TABS: 10 MG | ORAL | Qty: 1

## 2022-12-27 MED FILL — SYNTHROID 125 MCG PO TABS: 125 MCG | ORAL | Qty: 1

## 2022-12-27 MED FILL — ACETAMINOPHEN 325 MG PO TABS: 325 MG | ORAL | Qty: 2

## 2022-12-27 MED FILL — INSULIN LISPRO 100 UNIT/ML IJ SOLN: 100 UNIT/ML | INTRAMUSCULAR | Qty: 2

## 2022-12-27 MED FILL — MORPHINE SULFATE (PF) 4 MG/ML IV SOLN: 4 mg/mL | INTRAVENOUS | Qty: 1

## 2022-12-27 MED FILL — AMIODARONE HCL 200 MG PO TABS: 200 MG | ORAL | Qty: 1

## 2022-12-27 MED FILL — CEFTRIAXONE SODIUM 1 G IJ SOLR: 1 g | INTRAMUSCULAR | Qty: 1000

## 2022-12-27 MED FILL — CULTURELLE PO CAPS: ORAL | Qty: 1

## 2022-12-27 MED FILL — METOPROLOL SUCCINATE ER 25 MG PO TB24: 25 MG | ORAL | Qty: 1

## 2022-12-27 MED FILL — HEPARIN SODIUM (PORCINE) 1000 UNIT/ML IJ SOLN: 1000 UNIT/ML | INTRAMUSCULAR | Qty: 10

## 2022-12-27 MED FILL — LIDOCARE ARM/NECK/LEG 4 % EX PTCH: 4 % | CUTANEOUS | Qty: 3

## 2022-12-27 NOTE — Care Coordination-Inpatient (Signed)
 DISCHARGE PLANNING EVALUATION  12/27/22, 2:55 PM EST    Reason for Referral: home health   Decision Maker: makes own decisions, has POA  Current Services: none   New Services Requested:  home health  Family/ Social/ Home environment: patient lives at home w

## 2022-12-27 NOTE — Progress Notes (Signed)
 Verified with George,Karambella PA to resume continuous Heparin drip.

## 2022-12-27 NOTE — Progress Notes (Addendum)
 [facility]  INPATIENT PHYSICAL THERAPY  EVALUATION  [facility] 7K - 7K-04/004-A    Discharge Recommendations: Subacute/Skilled Nursing Facility, pt and family agreeable  Equipment Recommendations: No (defer to next level of care)

## 2022-12-27 NOTE — Plan of Care (Signed)
 Problem: Chronic Conditions and Co-morbidities  Goal: Patient's chronic conditions and co-morbidity symptoms are monitored and maintained or improved  Outcome: Progressing  Flowsheets (Taken 12/25/2022 0140 by Junius Finner, RN)  Care Plan - Patient's Ch

## 2022-12-27 NOTE — Progress Notes (Signed)
 Hospitalist Progress Note      Patient:  Michelle Bright    Unit/Bed:7K-04/004-A  Date of Birth: 1950-12-21  MRN: [id number]   Acct: [id number]   PCP: Elton Sin, MD  Date of A

## 2022-12-27 NOTE — Plan of Care (Signed)
 Plans home with home health

## 2022-12-27 NOTE — Care Coordination-Inpatient (Signed)
 12/27/22, 2:39 PM EST    DISCHARGE ON GOING EVALUATION    York Grice day: 4  Location: 7K-04/004-A Reason for admit: Hyperkalemia [E87.5]  AKI (acute kidney injury) (HCC) [N17.9]  Fall, initial encounter [W19.XXXA]  Closed fracture of sa

## 2022-12-27 NOTE — Progress Notes (Signed)
 [facility]  INPATIENT OCCUPATIONAL THERAPY  [facility] 7K  EVALUATION      Discharge Recommendations: Continue to assess pending progress, Patient would benefit from continued therapy after discharge  Equipment Recommendations: Yes bed

## 2022-12-28 LAB — CBC
Hematocrit: 30.3 % — ABNORMAL LOW (ref 37.0–47.0)
Hemoglobin: 9.5 g/dL — ABNORMAL LOW (ref 12.0–16.0)
MCH: 32.3 pg (ref 26.0–33.0)
MCHC: 31.4 g/dL — ABNORMAL LOW (ref 32.2–35.5)
MCV: 103.1 fL — ABNORMAL HIGH (ref 81.0–99.0)
MPV: 9.9 fL (ref 9.4–12.4)
Platelets: 241 10*3/uL (ref 130–400)
RBC: 2.94 10*6/uL — ABNORMAL LOW (ref 4.20–5.40)
RDW-CV: 14.3 % (ref 11.5–14.5)
RDW-SD: 53.8 fL — ABNORMAL HIGH (ref 35.0–45.0)
WBC: 11 10*3/uL — ABNORMAL HIGH (ref 4.8–10.8)

## 2022-12-28 LAB — POCT GLUCOSE
POC Glucose: 92 mg/dL (ref 70–108)
POC Glucose: 98 mg/dL (ref 70–108)
POC Glucose: 104 mg/dL (ref 70–108)
POC Glucose: 142 mg/dL — ABNORMAL HIGH (ref 70–108)

## 2022-12-28 LAB — BASIC METABOLIC PANEL
BUN: 46 mg/dL — ABNORMAL HIGH (ref 7–22)
CO2: 20 meq/L — ABNORMAL LOW (ref 23–33)
Calcium: 8.2 mg/dL — ABNORMAL LOW (ref 8.5–10.5)
Chloride: 105 meq/L (ref 98–111)
Creatinine: 1.3 mg/dL — ABNORMAL HIGH (ref 0.4–1.2)
Glucose: 93 mg/dL (ref 70–108)
Potassium: 4.5 meq/L (ref 3.5–5.2)
Sodium: 138 meq/L (ref 135–145)

## 2022-12-28 LAB — HEMOGLOBIN A1C
Estimated Avg Glucose: 108 mg/dL (ref 70–126)
Hemoglobin A1C: 5.6 % (ref 4.4–6.4)

## 2022-12-28 LAB — ANTI-XA, UNFRACTIONATED HEPARIN
Heparin Unfractionated: 0.22 U/mL — ABNORMAL LOW (ref 0.30–0.70)
Heparin Unfractionated: 0.31 U/mL (ref 0.30–0.70)
Heparin Unfractionated: 0.38 U/mL (ref 0.30–0.70)

## 2022-12-28 LAB — CULTURE, URINE

## 2022-12-28 LAB — CULTURE, BLOOD 1

## 2022-12-28 LAB — GLOMERULAR FILTRATION RATE, ESTIMATED: Est, Glom Filt Rate: 43 mL/min/{1.73_m2} — AB (ref >60)

## 2022-12-28 LAB — MAGNESIUM: Magnesium: 1.7 mg/dL (ref 1.6–2.4)

## 2022-12-28 LAB — ANION GAP: Anion Gap: 13 meq/L (ref 8.0–16.0)

## 2022-12-28 MED ORDER — SODIUM CHLORIDE 0.9 % IV SOLN
0.9 | INTRAVENOUS | Status: DC
Start: 2022-12-28 — End: 2022-12-28

## 2022-12-28 MED ORDER — HYDROCODONE-ACETAMINOPHEN 5-325 MG PO TABS
5-325 | Freq: Four times a day (QID) | ORAL | Status: DC | PRN
Start: 2022-12-28 — End: 2022-12-30
  Administered 2022-12-29 – 2022-12-30 (×2): 1 via ORAL

## 2022-12-28 MED ORDER — HYDROCODONE-ACETAMINOPHEN 5-325 MG PO TABS
5-325 | Freq: Four times a day (QID) | ORAL | Status: DC | PRN
Start: 2022-12-28 — End: 2022-12-30
  Administered 2022-12-28 – 2022-12-30 (×5): 2 via ORAL

## 2022-12-28 MED ORDER — HYDROXYZINE HCL 10 MG PO TABS
10 | Freq: Three times a day (TID) | ORAL | Status: DC | PRN
Start: 2022-12-28 — End: 2022-12-30
  Administered 2022-12-28 – 2022-12-30 (×3): 10 mg via ORAL

## 2022-12-28 MED ORDER — LIDOCAINE 4 % EX PTCH
4 | Freq: Every day | CUTANEOUS | Status: DC
Start: 2022-12-28 — End: 2022-12-30
  Administered 2022-12-28 – 2022-12-30 (×3): 2 via TRANSDERMAL

## 2022-12-28 MED ORDER — SODIUM CHLORIDE 0.9 % IV SOLN
0.9 | INTRAVENOUS | Status: AC
Start: 2022-12-28 — End: 2022-12-29
  Administered 2022-12-28 – 2022-12-29 (×2): via INTRAVENOUS

## 2022-12-28 MED FILL — HYDROCODONE-ACETAMINOPHEN 5-325 MG PO TABS: 5-325 MG | ORAL | Qty: 2

## 2022-12-28 MED FILL — AMIODARONE HCL 200 MG PO TABS: 200 MG | ORAL | Qty: 1

## 2022-12-28 MED FILL — HYDROXYZINE HCL 10 MG PO TABS: 10 MG | ORAL | Qty: 1

## 2022-12-28 MED FILL — HEPARIN SOD (PORCINE) IN D5W 100 UNIT/ML IV SOLN: 100 UNIT/ML | INTRAVENOUS | Qty: 250

## 2022-12-28 MED FILL — MORPHINE SULFATE 2 MG/ML IJ SOLN: 2 mg/mL | INTRAMUSCULAR | Qty: 1

## 2022-12-28 MED FILL — HYDROCODONE-ACETAMINOPHEN 5-325 MG PO TABS: 5-325 MG | ORAL | Qty: 1

## 2022-12-28 MED FILL — CULTURELLE PO CAPS: ORAL | Qty: 1

## 2022-12-28 MED FILL — MORPHINE SULFATE (PF) 4 MG/ML IV SOLN: 4 mg/mL | INTRAVENOUS | Qty: 1

## 2022-12-28 MED FILL — HEPARIN SODIUM (PORCINE) 1000 UNIT/ML IJ SOLN: 1000 UNIT/ML | INTRAMUSCULAR | Qty: 10

## 2022-12-28 MED FILL — LIDOCARE ARM/NECK/LEG 4 % EX PTCH: 4 % | CUTANEOUS | Qty: 2

## 2022-12-28 MED FILL — METOPROLOL SUCCINATE ER 25 MG PO TB24: 25 MG | ORAL | Qty: 1

## 2022-12-28 MED FILL — CYCLOBENZAPRINE HCL 10 MG PO TABS: 10 MG | ORAL | Qty: 1

## 2022-12-28 MED FILL — SYNTHROID 125 MCG PO TABS: 125 MCG | ORAL | Qty: 1

## 2022-12-28 NOTE — Other (Signed)
 RN went in room to see if pt would like to let nurse assess her.  Still pt refuses.  Husband and daughter in room.  Discussed with family that pt is not wanting staff to touch her or do anything.  Discussed with them the complications that can occur with l

## 2022-12-28 NOTE — Progress Notes (Signed)
 [facility]  [facility] 7K  Occupational Therapy  Daily Note    Discharge Recommendations: Continue to assess pending progress, Subacute/skilled nursing facility, and vs Home with Home Health OT  and 24 hour care, pending Pt progress an

## 2022-12-28 NOTE — Other (Signed)
 Went in to assess patient.  Pt yells "don't touch the bed" before I even approached her.  Pt states she does not want [place] to touch her, turn her, assess her.  Nurse asked why and pt states that she had a rough night.  Rn offered to assess her, reposition he

## 2022-12-28 NOTE — Progress Notes (Signed)
 Hospitalist Progress Note      Patient:  Michelle Bright 72 y.o. female       DOB: [birth year]  Unit/Bed:7K-04/004-A    Date of Admission: 12/23/2022      ASSESSMENT AND PLAN    Active Problems  AKI   Hyperkalemia, resolved  NAGMA  Cr 1.3 on arrival. Su

## 2022-12-28 NOTE — Care Coordination-Inpatient (Signed)
 12/28/22, 10:46 AM EST    DISCHARGE ON GOING EVALUATION    York Grice day: 5  Location: 7K-04/004-A Reason for admit: Hyperkalemia [E87.5]  AKI (acute kidney injury) (HCC) [N17.9]  Fall, initial encounter [W19.XXXA]  Closed fracture of s

## 2022-12-28 NOTE — Plan of Care (Signed)
 Problem: Discharge Planning  Goal: Discharge to home or other facility with appropriate resources  Outcome: Not Progressing  Flowsheets (Taken 12/28/2022 1739)  Discharge to home or other facility with appropriate resources:   Identify barriers to dischar

## 2022-12-28 NOTE — Plan of Care (Signed)
 Problem: Chronic Conditions and Co-morbidities  Goal: Patient's chronic conditions and co-morbidity symptoms are monitored and maintained or improved  Outcome: Progressing  Flowsheets (Taken 12/28/2022 0039)  Care Plan - Patient's Chronic Conditions and C

## 2022-12-28 NOTE — Other (Signed)
 Nurse int to check on pt, she is up to the chair and family is at bedside.  Family said therapy got her up, pt had agreed.  Pt tells nurse she is having "a lot of pain" and feels as though her "hip is going to fall off."  Pt did not want nurse to do vs but

## 2022-12-28 NOTE — Progress Notes (Signed)
 [facility]  INPATIENT PHYSICAL THERAPY  DAILY NOTE  [facility] 7K - 7K-04/004-A      Discharge Recommendations: Subacte/Skilled Nursing Facility  Equipment Recommendations: No (defer to next level of care)               Time In: 1354

## 2022-12-28 NOTE — Progress Notes (Addendum)
 Attending Note:     Patient was seen and examined by me in floor in conjunction with neurosurgery PA. Discussed with patient, her  nurse as well.  All data and imaging reviewed by myself. I agree with examination assessment and plan as documented below.

## 2022-12-28 NOTE — Care Coordination-Inpatient (Signed)
 12/28/22, 9:38 AM EST    DISCHARGE PLANNING EVALUATION    Patient hopes to return home with husband, requests HH.   May need further rehab depending on progress with mobility.  Discharge planning is ongoing.

## 2022-12-29 LAB — POCT GLUCOSE
POC Glucose: 109 mg/dL — ABNORMAL HIGH (ref 70–108)
POC Glucose: 82 mg/dL (ref 70–108)
POC Glucose: 146 mg/dL — ABNORMAL HIGH (ref 70–108)
POC Glucose: 105 mg/dL (ref 70–108)

## 2022-12-29 LAB — MAGNESIUM: Magnesium: 1.6 mg/dL (ref 1.6–2.4)

## 2022-12-29 LAB — CBC
Hematocrit: 31.5 % — ABNORMAL LOW (ref 37.0–47.0)
Hemoglobin: 9.9 g/dL — ABNORMAL LOW (ref 12.0–16.0)
MCH: 32.8 pg (ref 26.0–33.0)
MCHC: 31.4 g/dL — ABNORMAL LOW (ref 32.2–35.5)
MCV: 104.3 fL — ABNORMAL HIGH (ref 81.0–99.0)
MPV: 10.1 fL (ref 9.4–12.4)
Platelets: 256 10*3/uL (ref 130–400)
RBC: 3.02 10*6/uL — ABNORMAL LOW (ref 4.20–5.40)
RDW-CV: 14.5 % (ref 11.5–14.5)
RDW-SD: 54.6 fL — ABNORMAL HIGH (ref 35.0–45.0)
WBC: 8.9 10*3/uL (ref 4.8–10.8)

## 2022-12-29 LAB — BASIC METABOLIC PANEL
BUN: 38 mg/dL — ABNORMAL HIGH (ref 7–22)
CO2: 21 meq/L — ABNORMAL LOW (ref 23–33)
Calcium: 8.2 mg/dL — ABNORMAL LOW (ref 8.5–10.5)
Chloride: 106 meq/L (ref 98–111)
Creatinine: 1 mg/dL (ref 0.4–1.2)
Glucose: 83 mg/dL (ref 70–108)
Potassium: 4.1 meq/L (ref 3.5–5.2)
Sodium: 135 meq/L (ref 135–145)

## 2022-12-29 LAB — ANTI-XA, UNFRACTIONATED HEPARIN: Heparin Unfractionated: 0.29 U/mL — ABNORMAL LOW (ref 0.30–0.70)

## 2022-12-29 LAB — GLOMERULAR FILTRATION RATE, ESTIMATED: Est, Glom Filt Rate: 59 mL/min/{1.73_m2} — AB (ref >60)

## 2022-12-29 LAB — ANION GAP: Anion Gap: 8 meq/L (ref 8.0–16.0)

## 2022-12-29 MED ORDER — ENOXAPARIN SODIUM 60 MG/0.6ML IJ SOSY
60 | Freq: Two times a day (BID) | INTRAMUSCULAR | Status: DC
Start: 2022-12-29 — End: 2022-12-30
  Administered 2022-12-29 – 2022-12-30 (×2): 60 mg/kg via SUBCUTANEOUS

## 2022-12-29 MED ORDER — WARFARIN DAILY DOSING BY PHARMACIST (PLACEHOLDER)
Status: DC
Start: 2022-12-29 — End: 2022-12-30

## 2022-12-29 MED ORDER — WARFARIN SODIUM 1 MG PO TABS
1 | Freq: Once | ORAL | Status: AC
Start: 2022-12-29 — End: 2022-12-29
  Administered 2022-12-29: 22:00:00 3 mg via ORAL

## 2022-12-29 MED ORDER — FUROSEMIDE 20 MG PO TABS
20 | Freq: Every day | ORAL | Status: DC
Start: 2022-12-29 — End: 2022-12-30
  Administered 2022-12-29 – 2022-12-30 (×2): 20 mg via ORAL

## 2022-12-29 MED FILL — HYDROCODONE-ACETAMINOPHEN 5-325 MG PO TABS: 5-325 MG | ORAL | Qty: 2

## 2022-12-29 MED FILL — HYDROCODONE-ACETAMINOPHEN 5-325 MG PO TABS: 5-325 MG | ORAL | Qty: 1

## 2022-12-29 MED FILL — LOVENOX 60 MG/0.6ML IJ SOSY: 60 MG/0.6ML | INTRAMUSCULAR | Qty: 0.6

## 2022-12-29 MED FILL — FUROSEMIDE 20 MG PO TABS: 20 MG | ORAL | Qty: 1

## 2022-12-29 MED FILL — METOPROLOL SUCCINATE ER 25 MG PO TB24: 25 MG | ORAL | Qty: 1

## 2022-12-29 MED FILL — CULTURELLE PO CAPS: ORAL | Qty: 1

## 2022-12-29 MED FILL — ACETAMINOPHEN 325 MG PO TABS: 325 MG | ORAL | Qty: 2

## 2022-12-29 MED FILL — LANTUS 100 UNIT/ML SC SOLN: 100 UNIT/ML | SUBCUTANEOUS | Qty: 4

## 2022-12-29 MED FILL — AMIODARONE HCL 200 MG PO TABS: 200 MG | ORAL | Qty: 1

## 2022-12-29 MED FILL — CYCLOBENZAPRINE HCL 10 MG PO TABS: 10 MG | ORAL | Qty: 1

## 2022-12-29 MED FILL — HYDROXYZINE HCL 10 MG PO TABS: 10 MG | ORAL | Qty: 2

## 2022-12-29 MED FILL — LIDOCARE ARM/NECK/LEG 4 % EX PTCH: 4 % | CUTANEOUS | Qty: 2

## 2022-12-29 MED FILL — SYNTHROID 125 MCG PO TABS: 125 MCG | ORAL | Qty: 1

## 2022-12-29 MED FILL — COUMADIN 1 MG PO TABS: 1 MG | ORAL | Qty: 1

## 2022-12-29 NOTE — Other (Signed)
 RN in room with ot.  Pt incontinent of stool x 2.  Assisted pt to side of bed.  Pt becomes trachyphonic, lips blue.  RN placed pt on monitor, spo2 91%, hr reading 30.  Pt has history of a.fib.  lungs clear apices, diminished in bases with expiratory wheezi

## 2022-12-29 NOTE — Progress Notes (Addendum)
 Warfarin Pharmacy Consult Note    JEANICE DEMPSEY is a 72 y.o. female for whom pharmacy has been asked to manage warfarin therapy.     Consulting Provider: Talmadge Coventry, PA-C  Indication: Atrial fibrillation/Atrial flutter  Target INR: 2.0-3.0   Warfarin do

## 2022-12-29 NOTE — Care Coordination-Inpatient (Addendum)
 12/29/22, 2:28 PM EST    DISCHARGE PLANNING EVALUATION    SW spoke with Amil Amen from [facility], states that they do not have any beds available at this time. Patient's second choice is [facility], SW will make referral.       2:40 PM- SW spoke with

## 2022-12-29 NOTE — Progress Notes (Signed)
 [facility]  INPATIENT PHYSICAL THERAPY  DAILY NOTE  [facility] 7K - 7K-04/004-A      Discharge Recommendations: Continue to assess pending progress, Subacte/Skilled Nursing Facility, 24 hour assistance or supervision, and Patient woul

## 2022-12-29 NOTE — Plan of Care (Signed)
 Problem: Chronic Conditions and Co-morbidities  Goal: Patient's chronic conditions and co-morbidity symptoms are monitored and maintained or improved  12/29/2022 2358 by Jaye Beagle, RN  Outcome: Progressing  Flowsheets (Taken 12/29/2022 1157 by Leslie Dales

## 2022-12-29 NOTE — Plan of Care (Signed)
 Problem: Decision Making  Goal: Pt/Family able to effectively weigh alternatives and participate in decision making related to treatment and care  Description: INTERVENTIONS:  1. Determine when there are differences between patient's view, family's view,

## 2022-12-29 NOTE — Plan of Care (Signed)
 Problem: Chronic Conditions and Co-morbidities  Goal: Patient's chronic conditions and co-morbidity symptoms are monitored and maintained or improved  Outcome: Progressing  Flowsheets (Taken 12/29/2022 1157)  Care Plan - Patient's Chronic Conditions and C

## 2022-12-29 NOTE — Care Coordination-Inpatient (Signed)
 12/29/22, 2:57 PM EST    DISCHARGE ON GOING EVALUATION    Michelle Bright day: 6  Location: 7K-04/004-A Reason for admit: Hyperkalemia [E87.5]  AKI (acute kidney injury) (HCC) [N17.9]  Fall, initial encounter [W19.XXXA]  Closed fracture of sa

## 2022-12-29 NOTE — Progress Notes (Signed)
 [facility]  [facility] ORTH[facility]ED[facility] 7K  Occupational Therapy  Daily Note    Discharge Recommendations: Subacute/skilled nursing facility  Equipment Recommendations: Yes bedside commode      Time In: 0904  Time Out: 0943  Timed Code Treatment Minute

## 2022-12-29 NOTE — Progress Notes (Signed)
 Hospitalist Progress Note      Patient:  Michelle Bright 72 y.o. female       DOB: [birth year]  Unit/Bed:7K-04/004-A    Date of Admission: 12/23/2022      ASSESSMENT AND PLAN    Active Problems  AKI   Hyperkalemia, resolved  NAGMA  Cr 1.3 on arrival. Su

## 2022-12-30 LAB — CBC
Hematocrit: 33.1 % — ABNORMAL LOW (ref 37.0–47.0)
Hemoglobin: 10.3 g/dL — ABNORMAL LOW (ref 12.0–16.0)
MCH: 32.5 pg (ref 26.0–33.0)
MCHC: 31.1 g/dL — ABNORMAL LOW (ref 32.2–35.5)
MCV: 104.4 fL — ABNORMAL HIGH (ref 81.0–99.0)
MPV: 10 fL (ref 9.4–12.4)
Platelets: 283 10*3/uL (ref 130–400)
RBC: 3.17 10*6/uL — ABNORMAL LOW (ref 4.20–5.40)
RDW-CV: 14.5 % (ref 11.5–14.5)
RDW-SD: 54.7 fL — ABNORMAL HIGH (ref 35.0–45.0)
WBC: 9.9 10*3/uL (ref 4.8–10.8)

## 2022-12-30 LAB — BASIC METABOLIC PANEL
BUN: 36 mg/dL — ABNORMAL HIGH (ref 7–22)
CO2: 23 meq/L (ref 23–33)
Calcium: 8.7 mg/dL (ref 8.5–10.5)
Chloride: 105 meq/L (ref 98–111)
Creatinine: 1 mg/dL (ref 0.4–1.2)
Glucose: 85 mg/dL (ref 70–108)
Potassium: 4.5 meq/L (ref 3.5–5.2)
Sodium: 139 meq/L (ref 135–145)

## 2022-12-30 LAB — PROTIME-INR: INR: 1.2 — ABNORMAL HIGH (ref 0.85–1.13)

## 2022-12-30 LAB — ANION GAP: Anion Gap: 11 meq/L (ref 8.0–16.0)

## 2022-12-30 LAB — GLOMERULAR FILTRATION RATE, ESTIMATED: Est, Glom Filt Rate: 59 mL/min/{1.73_m2} — AB (ref >60)

## 2022-12-30 LAB — MAGNESIUM: Magnesium: 1.5 mg/dL — ABNORMAL LOW (ref 1.6–2.4)

## 2022-12-30 LAB — POCT GLUCOSE
POC Glucose: 91 mg/dL (ref 70–108)
POC Glucose: 81 mg/dL (ref 70–108)
POC Glucose: 129 mg/dL — ABNORMAL HIGH (ref 70–108)
POC Glucose: 111 mg/dL — ABNORMAL HIGH (ref 70–108)

## 2022-12-30 MED ORDER — POTASSIUM BICARB-CITRIC ACID 20 MEQ PO TBEF
20 | ORAL | Status: DC | PRN
Start: 2022-12-30 — End: 2022-12-30

## 2022-12-30 MED ORDER — MAGNESIUM SULFATE 2000 MG/50 ML IVPB PREMIX
2 | INTRAVENOUS | Status: DC | PRN
Start: 2022-12-30 — End: 2022-12-30

## 2022-12-30 MED ORDER — CYCLOBENZAPRINE HCL 5 MG PO TABS
5 | ORAL_TABLET | Freq: Three times a day (TID) | ORAL | 0 refills | Status: AC | PRN
Start: 2022-12-30 — End: 2023-01-09

## 2022-12-30 MED ORDER — LIDOCAINE 4 % EX PTCH
4 | Freq: Every day | CUTANEOUS | Status: AC
Start: 2022-12-30 — End: ?

## 2022-12-30 MED ORDER — ENOXAPARIN SODIUM 80 MG/0.8ML IJ SOSY
80 | Freq: Two times a day (BID) | INTRAMUSCULAR | Status: AC
Start: 2022-12-30 — End: 2023-01-03

## 2022-12-30 MED ORDER — WARFARIN SODIUM 1 MG PO TABS
1 | Freq: Once | ORAL | Status: AC
Start: 2022-12-30 — End: 2022-12-30
  Administered 2022-12-30: 19:00:00 3 mg via ORAL

## 2022-12-30 MED ORDER — HYDROCODONE-ACETAMINOPHEN 5-325 MG PO TABS
5-325 | ORAL_TABLET | Freq: Four times a day (QID) | ORAL | 0 refills | Status: AC | PRN
Start: 2022-12-30 — End: 2023-01-02

## 2022-12-30 MED ORDER — SPIRONOLACTONE 25 MG PO TABS
25 | Freq: Every day | ORAL | Status: DC
Start: 2022-12-30 — End: 2022-12-30
  Administered 2022-12-30: 18:00:00 25 mg via ORAL

## 2022-12-30 MED ORDER — POTASSIUM CHLORIDE CRYS ER 20 MEQ PO TBCR
20 | ORAL | Status: DC | PRN
Start: 2022-12-30 — End: 2022-12-30

## 2022-12-30 MED ORDER — HYDROXYZINE HCL 10 MG PO TABS
10 | ORAL_TABLET | Freq: Three times a day (TID) | ORAL | 0 refills | Status: AC | PRN
Start: 2022-12-30 — End: 2023-01-09

## 2022-12-30 MED ORDER — ENOXAPARIN SODIUM 80 MG/0.8ML IJ SOSY
80 | Freq: Two times a day (BID) | INTRAMUSCULAR | Status: DC
Start: 2022-12-30 — End: 2022-12-30
  Administered 2022-12-30: 14:00:00 70 mg/kg via SUBCUTANEOUS

## 2022-12-30 MED ORDER — CULTURELLE PO CAPS
Freq: Every day | ORAL | 0.00 refills | 5.00000 days | Status: DC
Start: 2022-12-30 — End: 2023-07-20

## 2022-12-30 MED ORDER — ENTRESTO 49-51 MG PO TABS
49-51 | Freq: Two times a day (BID) | ORAL | Status: AC
Start: 2022-12-30 — End: ?

## 2022-12-30 MED ORDER — POTASSIUM CHLORIDE 10 MEQ/100ML IV SOLN
10 | INTRAVENOUS | Status: DC | PRN
Start: 2022-12-30 — End: 2022-12-30

## 2022-12-30 MED FILL — ACETAMINOPHEN 650 MG RE SUPP: 650 MG | RECTAL | Qty: 1

## 2022-12-30 MED FILL — METOPROLOL SUCCINATE ER 25 MG PO TB24: 25 MG | ORAL | Qty: 1

## 2022-12-30 MED FILL — SYNTHROID 125 MCG PO TABS: 125 MCG | ORAL | Qty: 1

## 2022-12-30 MED FILL — LOVENOX 80 MG/0.8ML IJ SOSY: 80 MG/0.8ML | INTRAMUSCULAR | Qty: 0.8 | Fill #0

## 2022-12-30 MED FILL — HYDROCODONE-ACETAMINOPHEN 5-325 MG PO TABS: 5-325 MG | ORAL | Qty: 1 | Fill #0

## 2022-12-30 MED FILL — HYDROCODONE-ACETAMINOPHEN 5-325 MG PO TABS: 5-325 MG | ORAL | Qty: 2 | Fill #0

## 2022-12-30 MED FILL — COUMADIN 1 MG PO TABS: 1 MG | ORAL | Qty: 1 | Fill #0

## 2022-12-30 MED FILL — CULTURELLE PO CAPS: ORAL | Qty: 1 | Fill #0

## 2022-12-30 MED FILL — HYDROXYZINE HCL 10 MG PO TABS: 10 MG | ORAL | Qty: 1 | Fill #0

## 2022-12-30 MED FILL — SPIRONOLACTONE 25 MG PO TABS: 25 MG | ORAL | Qty: 1 | Fill #0

## 2022-12-30 MED FILL — ACETAMINOPHEN 325 MG PO TABS: 325 MG | ORAL | Qty: 2

## 2022-12-30 MED FILL — LOVENOX 60 MG/0.6ML IJ SOSY: 60 MG/0.6ML | INTRAMUSCULAR | Qty: 0.6

## 2022-12-30 MED FILL — AMIODARONE HCL 200 MG PO TABS: 200 MG | ORAL | Qty: 1

## 2022-12-30 MED FILL — HYDROCODONE-ACETAMINOPHEN 5-325 MG PO TABS: 5-325 MG | ORAL | Qty: 2

## 2022-12-30 MED FILL — FUROSEMIDE 20 MG PO TABS: 20 MG | ORAL | Qty: 1 | Fill #0

## 2022-12-30 MED FILL — LANTUS 100 UNIT/ML SC SOLN: 100 UNIT/ML | SUBCUTANEOUS | Qty: 4

## 2022-12-30 MED FILL — LIDOCARE ARM/NECK/LEG 4 % EX PTCH: 4 % | CUTANEOUS | Qty: 2 | Fill #0

## 2022-12-30 NOTE — Progress Notes (Signed)
 Clinical Pharmacy Note                                               Warfarin Discharge Recommendations    Patient discharged from [facility] today on warfarin.    Warfarin indication: Atrial fibrillation

## 2022-12-30 NOTE — Progress Notes (Signed)
 [facility]  PHYSICAL THERAPY MISSED TREATMENT NOTE  [facility] 7K    Date: 12/30/2022  Patient Name: Michelle Bright        MRN: [id number]   DOB: April 01, 1950  (72 y.o.)  Gender: female   Referring Practitioner: Dan Europe, PA-C

## 2022-12-30 NOTE — Care Coordination-Inpatient (Signed)
 Pt verbalized understanding and gave permission for possible discharge within 4 hours of receiving IMM.

## 2022-12-30 NOTE — Discharge Instructions - Pharmacy (Signed)
 Warfarin Discharge Instructions:   Date Warfarin Dose   12/31/22   (tomorrow) 1.5 mg (1/2 tablet)   01/01/23 1.5 mg (1/2 tablet)   01/02/23 INR

## 2022-12-30 NOTE — Care Coordination-Inpatient (Signed)
 12/30/22, 11:14 AM EST  DISCHARGE PLANNING EVALUATION    SW spoke with Sheralyn Boatman at [facility] and they will accept patient and start precert.     SW notified patient and family that [facility] has accepted and will start precert.     Attending

## 2022-12-30 NOTE — Progress Notes (Signed)
 [facility]  INPATIENT PHYSICAL THERAPY  DAILY NOTE  [facility] 7K - 7K-04/004-A      Discharge Recommendations: Subacte/Skilled Nursing Facility and Patient would benefit from continued PT at discharge  Equipment Recommendations: No

## 2022-12-30 NOTE — Discharge Summary (Signed)
 Hospitalist Discharge Summary    Patient: Michelle Bright  Date of Birth: 1950/06/19  MRN: [id number]   Acct: [id number]    Primary Care Physician: Elton Sin, MD    Admit date  12/23/2022    Discharge date:  12/30/2022    Chief Complaint on pr

## 2022-12-30 NOTE — Discharge Instructions (Addendum)
 Continuity of Care Form    Patient Name: Michelle Bright   DOB:  [birth year]  MRN:  [id number]    Admit date:  12/23/2022  Discharge date:  12/30/22    Code Status Order: Full Code   Advance Directives:   Advance Care Flowsheet Documentation             Admit

## 2022-12-30 NOTE — Progress Notes (Signed)
 [facility]  [facility] OR[facility] 7K  Occupational Therapy  Daily Note    Discharge Recommendations: Subacute/skilled nursing facility  Equipment Recommendations: Yes bedside commode      Time In: 1100  Time Out: 1130  Timed Code Treatment Minute

## 2022-12-30 NOTE — Progress Notes (Addendum)
 Pt d/c to [facility]. Report called to receiving nurse Rosey Bath. No complaints or signs of distress noted at time of d/c.

## 2023-01-03 ENCOUNTER — Telehealth: Admit: 2023-01-03 | Admitting: Family

## 2023-01-03 DIAGNOSIS — I480 Paroxysmal atrial fibrillation: Secondary | ICD-10-CM

## 2023-01-03 NOTE — Telephone Encounter (Signed)
 I am ok with it if Dr. Anastasio Champion is

## 2023-01-03 NOTE — Telephone Encounter (Signed)
 Husband called office back  Requesting call back from [facility]  Husband will not give nurse message to relay   Stated he needed to talk to [facility] personally  Informed husband nurse would let Emilie know   And will call back if she was able with time  Husband

## 2023-01-03 NOTE — Telephone Encounter (Signed)
 Nurse Katie notified

## 2023-01-03 NOTE — Telephone Encounter (Addendum)
 Patient husband called in. She fell and was in the hospital, d/c to nursing home. Supposedly the house physician there wants to switch patient from coumadin to eliquis 10mg  BID x7 days, then 5mg  BID d/t patient wont stop bleeding. There is a small wound on

## 2023-01-04 NOTE — Telephone Encounter (Signed)
 Husband called office   Requested watchman info be faxed to Dr. Geradine Girt office   Telephone encounter and [facility] info faxed

## 2023-01-04 NOTE — Telephone Encounter (Signed)
 Called and spoke with husband and discussed structural heart referral for discussion of a watchman. He will speak with his cardiologist and let us known.

## 2023-01-05 NOTE — Telephone Encounter (Signed)
Nurse Rosey Bath notified.  Referral given to structural

## 2023-01-05 NOTE — Telephone Encounter (Signed)
Have attempted twice to call patient based off phone number in the chart. No voicemail set up

## 2023-01-05 NOTE — Telephone Encounter (Signed)
Referral placed for watchman

## 2023-01-10 NOTE — Telephone Encounter (Signed)
 Called twice-no answer

## 2023-01-10 NOTE — Telephone Encounter (Signed)
 Several attempts to call patient's spouse for Watchman evaluation and no answer. If patient would like to proceed with Watchman will need to call the [facility]

## 2023-01-10 NOTE — Telephone Encounter (Signed)
 Patient husband called here asking to speak to [facility] again.  Tried to take questions to help answer. Would not speak with nurse. Said he only needs a couple minutes of Emilies time. Explained will get a message but can't make any promises Lewie Loron will have

## 2023-01-10 NOTE — Telephone Encounter (Signed)
 Appointment made fro January 21st 1 pm

## 2023-01-11 ENCOUNTER — Encounter: Admit: 2023-01-11 | Payer: MEDICARE | Admitting: Physician Assistant | Primary: Family Medicine

## 2023-01-11 ENCOUNTER — Encounter: Payer: MEDICARE | Attending: Physician Assistant | Primary: Family Medicine

## 2023-01-11 VITALS — BP 111/69 | HR 83

## 2023-01-11 DIAGNOSIS — Z9889 Other specified postprocedural states: Principal | ICD-10-CM

## 2023-01-11 NOTE — Progress Notes (Signed)
 [facility] [place] [facility]  [facility]  [address]  [place] [facility] [address]  Dept: [phone]  Dept Fax: [phone]  Loc: [phone]    Post Op Follow Visit  Visit Date: 01/11/2023      Dartha  is a

## 2023-01-26 NOTE — Telephone Encounter (Signed)
 Patient husband called back to cancel Dr Allena Katz appointment on 1/21 for Watchman referral. Not interested at this time. Hard to transport patient d/t surgeries

## 2023-01-26 NOTE — Telephone Encounter (Signed)
 noted

## 2023-01-26 NOTE — Telephone Encounter (Signed)
 Appears Watchman referral was cancelled, will take off list

## 2023-03-31 ENCOUNTER — Inpatient Hospital Stay
Admit: 2023-03-31 | Discharge: 2023-04-08 | Disposition: A | Discharge status: Skilled Nursing Facility | Payer: MEDICARE | Source: Other Acute Inpatient Hospital | Attending: Physician Assistant | Admitting: Physician Assistant

## 2023-03-31 ENCOUNTER — Inpatient Hospital Stay: Admit: 2023-03-31 | Payer: MEDICARE | Primary: Family Medicine

## 2023-03-31 DIAGNOSIS — R001 Bradycardia, unspecified: Secondary | ICD-10-CM

## 2023-03-31 LAB — CBC
Hematocrit: 37.8 % (ref 37.0–47.0)
Hemoglobin: 11 g/dL — ABNORMAL LOW (ref 12.0–16.0)
MCH: 29.5 pg (ref 26.0–33.0)
MCHC: 29.1 g/dL — ABNORMAL LOW (ref 32.2–35.5)
MCV: 101.3 fL — ABNORMAL HIGH (ref 81.0–99.0)
MPV: 10.3 fL (ref 9.4–12.4)
Platelets: 201 10*3/uL (ref 130–400)
RBC: 3.73 10*6/uL — ABNORMAL LOW (ref 4.20–5.40)
RDW-CV: 16 % — ABNORMAL HIGH (ref 11.5–14.5)
RDW-SD: 60.1 fL — ABNORMAL HIGH (ref 35.0–45.0)
WBC: 8.9 10*3/uL (ref 4.8–10.8)

## 2023-03-31 LAB — PHOSPHORUS: Phosphorus: 3.6 mg/dL (ref 2.5–4.5)

## 2023-03-31 LAB — BASIC METABOLIC PANEL
BUN: 45 mg/dL — ABNORMAL HIGH (ref 8–23)
CO2: 26 meq/L (ref 22–29)
Calcium: 7.9 mg/dL — ABNORMAL LOW (ref 8.8–10.2)
Chloride: 103 meq/L (ref 98–111)
Creatinine: 1.3 mg/dL — ABNORMAL HIGH (ref 0.5–0.9)
Glucose: 130 mg/dL — ABNORMAL HIGH (ref 74–109)
Potassium: 3.5 meq/L (ref 3.5–5.2)
Sodium: 142 meq/L (ref 135–145)

## 2023-03-31 LAB — GLOMERULAR FILTRATION RATE, ESTIMATED: Est, Glom Filt Rate: 43 mL/min/{1.73_m2} — AB (ref >60)

## 2023-03-31 LAB — T4, FREE: T4 Free: 1.1 ng/dL (ref 0.92–1.68)

## 2023-03-31 LAB — ANION GAP: Anion Gap: 13 meq/L (ref 8.0–16.0)

## 2023-03-31 LAB — TROPONIN: Troponin, High Sensitivity: 85 ng/L — ABNORMAL HIGH (ref 0–12)

## 2023-03-31 LAB — CALCIUM, IONIZED: Calcium, Ionized: 0.98 mmol/L — ABNORMAL LOW (ref 1.12–1.32)

## 2023-03-31 LAB — MAGNESIUM: Magnesium: 2.1 mg/dL (ref 1.6–2.6)

## 2023-03-31 LAB — TSH REFLEX TO FT4: TSH: 7.35 u[IU]/mL — ABNORMAL HIGH (ref 0.27–4.20)

## 2023-03-31 MED ORDER — FUROSEMIDE 20 MG PO TABS
20 | Freq: Every day | ORAL | Status: DC
Start: 2023-03-31 — End: 2023-04-02

## 2023-03-31 MED ORDER — NORMAL SALINE FLUSH 0.9 % IV SOLN
0.9 | INTRAVENOUS | Status: DC | PRN
Start: 2023-03-31 — End: 2023-04-08
  Administered 2023-04-03 (×2): 10 mL via INTRAVENOUS

## 2023-03-31 MED ORDER — SODIUM CHLORIDE 0.9 % IV SOLN
0.9 | INTRAVENOUS | Status: DC | PRN
Start: 2023-03-31 — End: 2023-04-08
  Administered 2023-04-01: 01:00:00 via INTRAVENOUS

## 2023-03-31 MED ORDER — DOBUTAMINE-DEXTROSE 2-5 MG/ML-% IV SOLN
2-5 | INTRAVENOUS | Status: DC
Start: 2023-03-31 — End: 2023-04-01

## 2023-03-31 MED ORDER — ENOXAPARIN SODIUM 30 MG/0.3ML IJ SOSY
30 | INTRAMUSCULAR | Status: DC
Start: 2023-03-31 — End: 2023-03-31
  Administered 2023-03-31: 22:00:00 30 mg via SUBCUTANEOUS

## 2023-03-31 MED ORDER — EMPAGLIFLOZIN 10 MG PO TABS
10 | Freq: Every day | ORAL | Status: DC
Start: 2023-03-31 — End: 2023-04-08

## 2023-03-31 MED ORDER — POLYETHYLENE GLYCOL 3350 17 G PO PACK
17 | Freq: Every day | ORAL | Status: DC | PRN
Start: 2023-03-31 — End: 2023-04-08

## 2023-03-31 MED ORDER — NORMAL SALINE FLUSH 0.9 % IV SOLN
0.9 | Freq: Two times a day (BID) | INTRAVENOUS | Status: DC
Start: 2023-03-31 — End: 2023-04-08
  Administered 2023-04-03 – 2023-04-08 (×11): 10 mL via INTRAVENOUS

## 2023-03-31 MED ORDER — POTASSIUM CHLORIDE 20 MEQ/50ML IV SOLN
20 | INTRAVENOUS | Status: DC | PRN
Start: 2023-03-31 — End: 2023-04-08

## 2023-03-31 MED ORDER — ACETAMINOPHEN 325 MG PO TABS
325 | Freq: Four times a day (QID) | ORAL | Status: DC | PRN
Start: 2023-03-31 — End: 2023-04-04
  Administered 2023-04-01 – 2023-04-04 (×2): 650 mg via ORAL

## 2023-03-31 MED ORDER — LEVOTHYROXINE SODIUM 125 MCG PO TABS
125 | Freq: Every day | ORAL | Status: DC
Start: 2023-03-31 — End: 2023-04-08
  Administered 2023-04-01 – 2023-04-08 (×8): 125 ug via ORAL

## 2023-03-31 MED ORDER — DOPAMINE-DEXTROSE 1.6-5 MG/ML-% IV SOLN
1.6-5 | INTRAVENOUS | Status: DC
Start: 2023-03-31 — End: 2023-04-01
  Administered 2023-03-31: 22:00:00 5 ug/kg/min via INTRAVENOUS

## 2023-03-31 MED ORDER — ONDANSETRON 4 MG PO TBDP
4 | Freq: Three times a day (TID) | ORAL | Status: DC | PRN
Start: 2023-03-31 — End: 2023-03-31

## 2023-03-31 MED ORDER — ACETAMINOPHEN 650 MG RE SUPP
650 | Freq: Four times a day (QID) | RECTAL | Status: DC | PRN
Start: 2023-03-31 — End: 2023-04-08

## 2023-03-31 MED ORDER — MAGNESIUM SULFATE 2000 MG/50 ML IVPB PREMIX
2 | INTRAVENOUS | Status: DC | PRN
Start: 2023-03-31 — End: 2023-04-08

## 2023-03-31 MED ORDER — POTASSIUM CHLORIDE 10 MEQ/100ML IV SOLN
10 | INTRAVENOUS | Status: DC | PRN
Start: 2023-03-31 — End: 2023-04-08

## 2023-03-31 MED ORDER — ONDANSETRON HCL 4 MG/2ML IJ SOLN
4 | Freq: Four times a day (QID) | INTRAMUSCULAR | Status: DC | PRN
Start: 2023-03-31 — End: 2023-03-31

## 2023-03-31 MED FILL — LOVENOX 30 MG/0.3ML IJ SOSY: 300.3 MG/0.3ML | INTRAMUSCULAR | Qty: 0.3

## 2023-03-31 MED FILL — DOPAMINE-DEXTROSE 1.6-5 MG/ML-% IV SOLN: 1.6-5- MG/ML-% | INTRAVENOUS | Qty: 250

## 2023-03-31 NOTE — Telephone Encounter (Signed)
 Transfer from [facility]   Hx AF on eloquis past few days decreased mobility, husband states would stand still with her walker, pauses, FP said to sent to ER, 3 degree HB HR 37 symptomatoic dizzy and no energy on Dobutamine was in the 60's but now 40"s trops 98/88 INR 2.4 CR 1.4 WBC 10.7 chest ok EKG initially AF with 3 degree second showed just 3 degree HB 98.7 48 18 102/37 NS and maxed on Dobutamine Dr Richardson Dopp adm

## 2023-03-31 NOTE — Progress Notes (Incomplete)
 1545 [facility]. Patient transferred to ICU bed and placed on continuous ICU bedside monitor. Patient admitted for 3rd degree heart block. Vitals obtained. See flowsheets. Patient's IV access includes int rt hand, iv lac. Current infusions and rates of infusion include dobutamine infusing.. Assessment completed by kate. Two nurse skin assessment completed by Jae Dire and Syaire Saber. See flowsheets for assessment details. Policies and procedures of ICU able to be explained to patient at this time. Family member(s)/representative(s) present at time of admission include husband,daughter. Patient rights explained to family member(s)/representatives and patient, as able. Patient/patient's family member(s)/representative(s) N/A to have physician notified of their admission. All questions posed by patient's family member(s)/representative(s) and patient answered at this time.    1550 Assessment completed.

## 2023-03-31 NOTE — Progress Notes (Signed)
 Transfer from [facility]   Hx AF on eloquis past few days decreased mobility, husband states would stand still with her walker, pauses, FP said to sent to ER, 3 degree HB HR 37 symptomatoic dizzy and no energy on Dobutamine was in the 60's but now 40"s trops 98/88 INR 2.4 CR 1.4 WBC 10.7 chest ok EKG initially AF with 3 degree second showed just 3 degree HB 98.7 48 18 102/37 NS and maxed on Dobutamine Dr Richardson Dopp adm

## 2023-03-31 NOTE — H&P (Signed)
 Chart reviewed for dc planning purposes. Pt has been in ED approx 22 hours at this time, is currently admitted, boarding in ED.     CM will continue to follow.

## 2023-03-31 NOTE — Procedures (Signed)
 PROCEDURE NOTE  Date: 03/31/2023   Name: Michelle Bright  Date of Birth: 21-Feb-1950    Procedures  EKG completed

## 2023-03-31 NOTE — Procedures (Signed)
 PROCEDURE NOTE  Date: 03/31/2023   Name: Michelle Bright  Date of Birth: 12/02/1950    Procedures  12 lead EKG completed. Results handed to [facility]

## 2023-03-31 NOTE — Progress Notes (Signed)
 PROCEDURE NOTE  Date: 03/31/2023   Name: Michelle Bright  Date of Birth: 21-Feb-1950    Procedures  EKG completed

## 2023-03-31 NOTE — Progress Notes (Signed)
 DME Company:   Order sent to [employer] 03-31-2023.

## 2023-04-01 ENCOUNTER — Inpatient Hospital Stay: Admit: 2023-04-01 | Discharge: 2023-04-05 | Payer: MEDICARE | Primary: Family Medicine

## 2023-04-01 LAB — ECHO (TTE) COMPLETE (PRN CONTRAST/BUBBLE/STRAIN/3D)
AR Max Velocity PISA: 4.2 m/s
AR PHT: 574 ms
AV Area by Peak Velocity: 1.1 cm2
AV Area by VTI: 1.1 cm2
AV Cusp Mmode: 1.1 cm
AV Mean Gradient: 13 mmHg
AV Mean Velocity: 1.7 m/s
AV Peak Gradient: 25 mmHg
AV Peak Velocity: 2.5 m/s
AV VTI: 56.6 cm
AV Velocity Ratio: 0.4
Ascending Aorta: 2.9 cm
E/E' Lateral: 16.25
E/E' Ratio (Averaged): 20.06
E/E' Septal: 23.88
EF 3D: 60 %
Est. RA Pressure: 8 mmHg
Fractional Shortening 2D: 22 % (ref 28–44)
IVSd: 1.2 cm — AB (ref 0.6–0.9)
LA Area 2C: 25.9 cm2
LA Area 4C: 27 cm2
LA Diameter: 4.2 cm
LA Major Axis: 6.8 cm
LA Minor Axis: 6.5 cm
LA Volume BP: 89 mL — AB (ref 22–52)
LA Volume MOD A2C: 85 mL — AB (ref 22–52)
LA Volume MOD A4C: 88 mL — AB (ref 22–52)
LV E' Lateral Velocity: 7.2 cm/s
LV E' Septal Velocity: 4.9 cm/s
LV IVRT: 85 ms
LV Mass 2D: 171.7 g — AB (ref 67–162)
LV RWT Ratio: 0.59
LVIDd: 4.1 cm (ref 3.9–5.3)
LVIDs: 3.2 cm
LVOT Area: 2.8 cm2
LVOT Diameter: 1.9 cm
LVOT Mean Gradient: 2 mmHg
LVOT Peak Gradient: 4 mmHg
LVOT Peak Velocity: 1 m/s
LVOT SV: 61.5 mL
LVOT VTI: 21.7 cm
LVOT:AV VTI Index: 0.38
LVPWd: 1.2 cm — AB (ref 0.6–0.9)
MR Peak Gradient: 130 mmHg
MR Peak Velocity: 5.7 m/s
MV A Velocity: 0.62 m/s
MV E Velocity: 1.17 m/s
MV E Wave Deceleration Time: 403 ms
MV E/A: 1.89
PV Max Velocity: 1 m/s
PV Peak Gradient: 4 mmHg
RV Free Wall Peak S': 13.1 cm/s
RVIDd: 2.3 cm
RVSP: 51 mmHg
TAPSE: 2.5 cm (ref 1.7–?)
TR Max Velocity: 3.29 m/s
TR Peak Gradient: 43 mmHg
TV E Wave Velocity: 0.4 m/s

## 2023-04-01 LAB — EKG 12-LEAD
Atrial Rate: 45 {beats}/min
Atrial Rate: 59 {beats}/min
P Axis: 71 degrees
P Axis: 79 degrees
P-R Interval: 208 ms
P-R Interval: 200 ms
Q-T Interval: 736 ms
Q-T Interval: 470 ms
Q-T Interval: 588 ms
QRS Duration: 152 ms
QRS Duration: 142 ms
QRS Duration: 140 ms
QTc Calculation (Bazett): 636 ms
QTc Calculation (Bazett): 465 ms
QTc Calculation (Bazett): 514 ms
R Axis: -18 degrees
R Axis: -23 degrees
R Axis: -24 degrees
T Axis: 24 degrees
T Axis: 95 degrees
T Axis: -74 degrees
Ventricular Rate: 45 {beats}/min
Ventricular Rate: 59 {beats}/min
Ventricular Rate: 46 {beats}/min

## 2023-04-01 LAB — BRAIN NATRIURETIC PEPTIDE: NT Pro-BNP: 3337 pg/mL — ABNORMAL HIGH (ref 0.0–124.0)

## 2023-04-01 LAB — HEPATIC FUNCTION PANEL
ALT: 15 U/L (ref 10–35)
AST: 33 U/L (ref 10–35)
Albumin: 3.4 g/dL (ref 3.4–4.9)
Alkaline Phosphatase: 125 U/L — ABNORMAL HIGH (ref 35–104)
Bilirubin, Direct: 0.2 mg/dL (ref 0.0–0.2)
Total Bilirubin: 0.3 mg/dL (ref 0.3–1.2)
Total Protein: 6.3 g/dL — ABNORMAL LOW (ref 6.4–8.3)

## 2023-04-01 LAB — CBC
Hematocrit: 39.8 % (ref 37.0–47.0)
Hemoglobin: 11.6 g/dL — ABNORMAL LOW (ref 12.0–16.0)
MCH: 29.6 pg (ref 26.0–33.0)
MCHC: 29.1 g/dL — ABNORMAL LOW (ref 32.2–35.5)
MCV: 101.5 fL — ABNORMAL HIGH (ref 81.0–99.0)
MPV: 10.2 fL (ref 9.4–12.4)
Platelets: 237 10*3/uL (ref 130–400)
RBC: 3.92 10*6/uL — ABNORMAL LOW (ref 4.20–5.40)
RDW-CV: 16.2 % — ABNORMAL HIGH (ref 11.5–14.5)
RDW-SD: 60.6 fL — ABNORMAL HIGH (ref 35.0–45.0)
WBC: 12 10*3/uL — ABNORMAL HIGH (ref 4.8–10.8)

## 2023-04-01 LAB — BASIC METABOLIC PANEL
BUN: 37 mg/dL — ABNORMAL HIGH (ref 8–23)
CO2: 23 meq/L (ref 22–29)
Calcium: 8.9 mg/dL (ref 8.8–10.2)
Chloride: 105 meq/L (ref 98–111)
Creatinine: 1.1 mg/dL — ABNORMAL HIGH (ref 0.5–0.9)
Glucose: 119 mg/dL — ABNORMAL HIGH (ref 74–109)
Potassium: 4.8 meq/L (ref 3.5–5.2)
Sodium: 140 meq/L (ref 135–145)

## 2023-04-01 LAB — CORTISOL TOTAL: Cortisol: 22.2 ug/dL

## 2023-04-01 LAB — TROPONIN: Troponin, High Sensitivity: 75 ng/L — ABNORMAL HIGH (ref 0–12)

## 2023-04-01 LAB — SPECIMEN REJECTION

## 2023-04-01 LAB — PROCALCITONIN: Procalcitonin: 0.07 ng/mL (ref 0.01–0.09)

## 2023-04-01 LAB — MAGNESIUM: Magnesium: 2.2 mg/dL (ref 1.6–2.6)

## 2023-04-01 LAB — TSH: TSH: 14.7 u[IU]/mL — ABNORMAL HIGH (ref 0.27–4.20)

## 2023-04-01 LAB — GLOMERULAR FILTRATION RATE, ESTIMATED: Est, Glom Filt Rate: 53 mL/min/{1.73_m2} — AB (ref >60)

## 2023-04-01 LAB — VITAMIN B12 & FOLATE
Folate: >20 ng/mL (ref 4.6–34.8)
Vitamin B-12: 1028 pg/mL (ref 232–1245)

## 2023-04-01 LAB — C-REACTIVE PROTEIN: CRP: 1.53 mg/dL — ABNORMAL HIGH (ref 0.00–0.50)

## 2023-04-01 LAB — ANION GAP: Anion Gap: 12 meq/L (ref 8.0–16.0)

## 2023-04-01 LAB — SEDIMENTATION RATE: Sed Rate, Automated: 19 mm/h (ref 0–20)

## 2023-04-01 LAB — CALCIUM, IONIZED: Calcium, Ionized: 1.1 mmol/L — ABNORMAL LOW (ref 1.12–1.32)

## 2023-04-01 LAB — PROTIME-INR: INR: 2.65 — ABNORMAL HIGH (ref 0.85–1.13)

## 2023-04-01 MED ORDER — OXYCODONE-ACETAMINOPHEN 5-325 MG PO TABS
5-325 | Freq: Four times a day (QID) | ORAL | Status: DC | PRN
Start: 2023-04-01 — End: 2023-04-08
  Administered 2023-04-01 – 2023-04-03 (×5): 1 via ORAL

## 2023-04-01 MED ORDER — EPINEPHRINE 5 MG IN NS 250 ML INFUSION
Status: AC
Start: 2023-04-01 — End: 2023-04-01
  Administered 2023-04-01: 07:00:00 1 via INTRAVENOUS

## 2023-04-01 MED ORDER — EPINEPHRINE 5 MG IN NS 250 ML INFUSION
Status: DC
Start: 2023-04-01 — End: 2023-04-03

## 2023-04-01 MED ORDER — POTASSIUM CHLORIDE CRYS ER 20 MEQ PO TBCR
20 | Freq: Once | ORAL | Status: AC
Start: 2023-04-01 — End: 2023-03-31
  Administered 2023-04-01: 01:00:00 40 meq via ORAL

## 2023-04-01 MED ORDER — CALCIUM GLUCONATE-NACL 2-0.675 GM/100ML-% IV SOLN
2-0.675 | INTRAVENOUS | Status: DC | PRN
Start: 2023-04-01 — End: 2023-04-08

## 2023-04-01 MED ORDER — CALCIUM GLUCONATE-NACL 2-0.675 GM/100ML-% IV SOLN
2-0.675 | Freq: Once | INTRAVENOUS | Status: AC
Start: 2023-04-01 — End: 2023-03-31
  Administered 2023-04-01: 01:00:00 2000 mg via INTRAVENOUS

## 2023-04-01 MED ORDER — PROCHLORPERAZINE EDISYLATE 10 MG/2ML IJ SOLN
10 | Freq: Four times a day (QID) | INTRAMUSCULAR | Status: DC | PRN
Start: 2023-04-01 — End: 2023-04-08
  Administered 2023-04-01 – 2023-04-06 (×3): 10 mg via INTRAVENOUS

## 2023-04-01 MED ORDER — HYDRALAZINE HCL 20 MG/ML IJ SOLN
20 | Freq: Once | INTRAMUSCULAR | Status: AC
Start: 2023-04-01 — End: 2023-03-31
  Administered 2023-04-01: 03:00:00 10 mg via INTRAVENOUS

## 2023-04-01 MED FILL — HYDRALAZINE HCL 20 MG/ML IJ SOLN: 20 MG/ML | INTRAMUSCULAR | Qty: 1

## 2023-04-01 MED FILL — POTASSIUM CHLORIDE CRYS ER 20 MEQ PO TBCR: 20 MEQ | ORAL | Qty: 2

## 2023-04-01 MED FILL — ACETAMINOPHEN 325 MG PO TABS: 325 MG | ORAL | Qty: 2

## 2023-04-01 MED FILL — SYNTHROID 125 MCG PO TABS: 125 MCG | ORAL | Qty: 1

## 2023-04-01 MED FILL — PROCHLORPERAZINE EDISYLATE 10 MG/2ML IJ SOLN: 102 MG/2ML | INTRAMUSCULAR | Qty: 2

## 2023-04-01 MED FILL — CALCIUM GLUCONATE-NACL 2-0.675 GM/100ML-% IV SOLN: 2-0.675100- GM/100ML-% | INTRAVENOUS | Qty: 100

## 2023-04-01 MED FILL — OXYCODONE-ACETAMINOPHEN 5-325 MG PO TABS: 5-325 MG | ORAL | Qty: 1

## 2023-04-01 MED FILL — EPINEPHRINE 5 MG IN NS 250 ML INFUSION: Qty: 250

## 2023-04-01 NOTE — Progress Notes (Signed)
 1930 Notify Dr. Juanetta Gosling, Dr. Jonna Bamford, and Dr/ Marshall Cork of SBP 170s with HR 35-50 on dopamine @5 , orders to decrease to 2.5    2000 Notify Dr. Juanetta Gosling and Dr. Jonna Dilday pt c/o nausea, orders for compazine received    2110 Notify Dr. Juanetta Gosling and Dr. Marshall Cork of HR maintaining 40s and consistently reaching 32 with dopamine @2 .5, orders to increase dopamine to 5 and give one time dose of 10 hydralazine    2115 dopamine@5 , BP 122/35    2145 BP 188/50, hydralazine given, see MAR    2235 Dr. Juanetta Gosling and Dr. Marshall Cork notified pt c/o SOB, very anxious, c/o 7/10 pain in L leg, shoulder, and back. BP 106/90 with HR 50s on dopamine@5 . Discussed dopamine titrations and alternatives, EKG ordered.    2250 STAT EKG at bedside reading HR 59 sinus brady with LBBB, given to Dr. Marshall Cork at bedside    0133 Dr. Juanetta Gosling notified BP 196/46, orders to turn dopamine off    0145 BP 87/23    0150 BP 81/23 Dr. Juanetta Gosling notified, orders for epi gtt received    0155 epi gtt started @1  per Dr. Juanetta Gosling    0215 BP 74/17, epi gtt increased to 2    0220 BP 67/22, epi gtt increased to 3, Dr. Juanetta Gosling notified, rhythm changes- HR 30s 2nd degree block    0221 STAT EKG, HR 46 reading wide QRS with LBBB    0230 BP 116/32    0350 Dr. Juanetta Gosling at bedside for update, current gtts reviewed, HR 40s with epi @2 . Pt still intermittently very anxious and fearful.

## 2023-04-01 NOTE — Procedures (Signed)
 PROCEDURE NOTE  Date: 04/01/2023   Name: Michelle Bright  Date of Birth: September 14, 1950    Procedures  12 lead EKG completed. Results handed to [facility].

## 2023-04-01 NOTE — Consults (Signed)
 [facility]                 [address] [place], [place] [address]                              CONSULTATION      PATIENT NAME: Michelle Bright, Michelle Bright              DOB: [birth year]  MED REC NO: [id number]                       ROOM: 4D-09  ACCOUNT NO: [id number]                       ADMIT DATE: 03/31/2023  PROVIDER: Archie Balboa, MD    CARDIOLOGY CONSULT    CONSULT DATE: 04/01/2023    REFERRING PHYSICIAN:  ICU Team      REASON FOR CONSULTATION:  Symptomatic bradycardia.    HISTORY OF PRESENT ILLNESS:  This is Bright pleasant 73 year old lady, who apparently follows with Dr. Geradine Girt, who has Bright history of known atrial fibrillation, who came in with symptoms of fatigue, tiredness, palpitation, and no obvious syncope.  Was found to have significant bradycardia.  Was admitted for Bright diagnosis of symptomatic bradycardia.  She initially presented to outlying hospital and was subsequently transferred to [facility].  The patient has been maintained on amiodarone and metoprolol as an outpatient which has been on hold.  Denies any active chest pain.  Does have some baseline shortness of breath.    REVIEW OF SYSTEMS:  1. No obvious fever or chills.  2. No hematuria or dysuria.  3. No abdominal pain, nausea, vomiting, or diarrhea.  4. No obvious active psych problems or suicidal ideation.  5. No skin rashes.  6. No obvious dizziness, lightheadedness, or loss of consciousness.  7. No recent trauma.  8. No bleeding problem.  9. No HEENT related problem.    PAST MEDICAL HISTORY:    1. Cardiomyopathy.  2. Atrial fibrillation.  3. No known history of coronary artery disease.  The patient had Bright cardiac cath in November 2023, which showed no significant disease.    ALLERGIES:  XARELTO.      CURRENT MEDICATIONS:  Jardiance 10 Bright day, Lasix 20 Bright day, levothyroxine 125 Bright day, metoprolol 25 Bright day.    SOCIAL HISTORY:  History of smoking.  No alcohol or drug abuse.    FAMILY HISTORY:  Significant for coronary  artery disease.    PHYSICAL EXAMINATION:  VITAL SIGNS:  Blood pressure of 130/70, heart rate of 50.   GENERAL APPEARANCE:  Pleasant lady, in no obvious acute distress.  EYES AND EARS:  No discharge.  NECK:  No JVD.  No bruits.  No masses.  LUNGS:  Decreased air entry.  No crackles.  No wheezes.  HEART:  Normal S1, S2.  Systolic murmur grade 2/6.   ABDOMEN:  Soft, nontender.  Positive bowel sounds.  No organomegaly.  EXTREMITIES:  No significant edema.  NEUROLOGICAL:  Grossly intact.  Awake, alert.  No focal deficits.  PSYCH:  No evidence of active psychosis.  SKIN:  No rashes.    LABORATORY DATA:  Showed sodium 140, potassium 4.8, BUN 37, creatinine 1.1.  White count 12, hemoglobin 11.6, hematocrit 39.8, platelets 237.  EKG showed sinus bradycardia with nonspecific changes.    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  The patient has Bright history of atrial fibrillation and cardiomyopathy.    PLAN:  As follows,  1. Continue to keep the AV node blockers on hold.  2. Wean the epinephrine off.  3. Transfer to step-down unit if she continues to be stable.  4. Follow the echo.  5. Further consideration will depend on the patient's clinical scenario.  She will be considered for Bright pacemaker if she shows evidence of bradycardia after adjustment of medication.  Otherwise, we will continue with medical therapy and follow up as an outpatient after discharge.    Thank you for allowing me to participate in her care.          Archie Balboa, MD      D:  04/01/2023 10:17:26     T:  04/01/2023 11:42:57     ZAA/AQS  Job #:  [id number]     Doc#:  [id number]

## 2023-04-01 NOTE — Telephone Encounter (Signed)
 Problem: Discharge Planning  Goal: Discharge to home or other facility with appropriate resources  Outcome: Progressing       Consult received. Please see SW note dated 3/08.

## 2023-04-01 NOTE — Progress Notes (Signed)
 CRITICAL CARE  PROGRESS NOTE       Patient:  ALYSSIA HEESE    Unit/Bed:4D-09/009-A  Date of Birth: 08/11/1950  MRN: [id number]   PCP: Elton Sin, MD  Date of Admission: 03/31/2023  Chief Complaint:-Heart palpitations, fatigue    Assessment and Plan:    Symptomatic bradycardia: Unknown cause.  Possible medications including beta-blocker/amiodarone for PAF.  Underlying CHF HFrEF.  T4 free within normal limits.  Checking TSH, cortisol, and ruling out possible infectious cause and checking Pro-Cal/ESR/CRP and UA.  -Will continue epi drip for now.  Heart rate 50 and above his goal.  -AV blocking meds including beta-blocker Lopressor, and amiodarone was held  -Telemetry pulse ox  -Daily weights INO's.  Fall/aspiration precautions.  -Cardiology consulted.  Awaiting recommendations.  May need EP eval  -Daily BMP mag.  CBC.  Will keep potassium around 4.0, mag around 2.0.  CHF HFrEF: Nonischemic cardiomyopathy, moderate MR.  Last echo dated 03/09/2022.  EF  35%.   -GDMT as able.  However due to symptomatic bradycardia, beta-blocker is held.  -Awaiting echo  -Awaiting cardiology recommendations.  -Daily weights INO's  -Daily BMP CBC.  Paroxysmal AF: On anticoagulation.  Beta-blocker amiodarone was held due to symptomatic bradycardia.  QTc prolongation: Noted.  Will avoid QTc prolonging medications.  Mild AKI on CKD stage III: Resolving.  Creatinine came down to 1.1 from 1.3.  Baseline looks 1.0.  Will continue monitoring.  Daily BMP.  Primary hypertension: Home meds are being verified.  Beta-blocker-metoprolol as held due to bradycardia.  Hypothyroidism: Synthroid resumed.  Mild macrocytic anemia: Anemia of chronic disease.  Checking with B12/folic acid levels.  Will continue monitoring.  No signs of acute bleeding noted.  Daily CBC  History of kyphosis with sacroplasty/kyphoplasty.  Noted  Deconditioning/debility.  Due to multiple chronic qualities.  PT OT.  Patient may benefit from social work including home outside  or rehab.  Suspected chronic lung disease.  Chest x-ray positive for chronic interstitial thickening/fibrotic changes.  For now as needed albuterol.  Patient will benefit from PFTs at after discharge.  Moderate MR and TR.  Noted on last echo.  Positive for systolic murmur.  Monitoring.  Contributing to CHF.  INITIAL H AND P AND ICU COURSE:  Initial HPI Per EMR. 73 year old female past medical history nonischemic cardiomyopathy, hypertension, hypothyroidism, paroxysmal atrial fibrillation presenting from outside hospital for symptomatic bradycardia.  Has been having fatigue and palpitations for approximately 1 week.  Found to have a significantly low heart rate with concern for complete heart block outside facility.  Was started on dobutamine and transferred to [facility] for further evaluation.  On arrival to [facility], patient was switched from dobutamine to dopamine.  Marland Kitchen  Answering questions appropriately.  Cardiology consulted for further evaluation.     04/01/2023.  Patient seen at the bedside.  She is awake, alert oriented X.3.  Throughout the night, dobutamine weaned off and started dopamine, however due to high sensitivity, patient's systolic blood pressure went up around 170-80s.  Then dopamine weaned off and started epinephrine low-dose.  Cardiology consulted.  Awaiting recommendations.    Past Medical History: Hypertension.  Family History: Hypertension.  Social History: Denies illicit drugs.    ROS   General.  Complains of fatigue, heart palpitations.  Denies fever chills.  Cardiovascular.  Complains of fatigue, heart palpitations.  Denies chest pain.  Pulmonary.  Denies productive cough, fever or chills.  Gastroc enterology.  Denies constipation diarrhea abdominal pain  Genitourinary.  Denies dysuria  Psychiatric.  Denies suicidal thoughts.  Neurology.  Neurology.  Denies recent vision/hearing changes.    Scheduled Meds:   sodium chloride flush  5-40 mL IntraVENous 2 times per day    [Held by provider] furosemide  20  mg Oral Daily    levothyroxine  125 mcg Oral Daily    [Held by provider] empagliflozin  10 mg Oral Daily     Continuous Infusions:   EPINEPHrine 3 mcg/min (04/01/23 0439)    DOBUTamine      DOPamine Stopped (04/01/23 0133)    sodium chloride 15 mL/hr at 04/01/23 0439       PHYSICAL EXAMINATION:  T: 98.8.  P: 50. RR: 16. B/P: 118/42.  FiO2: Room air. O2 Sat: 90%.  I/O: Pending  Body mass index is 21.37 kg/m.   GCS:   15  On room air  General:   Ill looking.  Somewhat malnourished  HEENT:  normocephalic and atraumatic.  No scleral icterus. PERR  Neck: supple.  No Thyromegaly.  Lungs: Diminished breath sounds bilaterally.  No wheezings.  Cardiac: Mild bradycardia.  No JVD.  Systolic murmur  Abdomen: soft.  Nontender.  Extremities:  No clubbing, cyanosis, or edema x 4.    Vasculature: capillary refill < 3 seconds. Palpable dorsalis pedis pulses.  Skin:  warm and dry.  Psych:  Alert and oriented x3.  Affect appropriate  Lymph:  No supraclavicular adenopathy.  Neurologic:  No focal deficit. No seizures.    Data: (All radiographs, tracings, PFTs, and imaging are personally viewed and interpreted unless otherwise noted).   Sodium 140, potassium 4.8, creatinine 1.1, BUN 37, GFR 53  proBNP 3337.0  WBC 12.0, RBC 3.9, hemoglobin 11, MCV 101, platelet count 237  Telemetry shows mild bradycardia, heart rate around 50-52.   Chest x-ray.  Cardiomegaly.  Underlying chronic fibrotic lung changes.  EKG.  LBBB.  Wide QRS.        Seen with multidisciplinary ICU team .  Meets Continued ICU Level Care Criteria:    [x]  Yes   []  No - Transfer Planned to listed location:  []  HOSPITALIST CONTACTED- Dr.     Case and plan discussed with Dr. Jenetta Downer MD Pulm/CC.        Electronically signed by Carl Best, DO  Internal Medicine

## 2023-04-01 NOTE — Procedures (Signed)
 PROCEDURE NOTE  Date: 04/02/2023   Name: Michelle Bright  Date of Birth: 10/02/1950    Procedures  12 lead EKG completed. Results handed to [employer].

## 2023-04-01 NOTE — Care Coordination-Inpatient (Signed)
 Case Management Assessment Initial Evaluation    Date/Time of Evaluation: 04/01/2023 10:51 AM  Assessment Completed by: Caro Hight, RN    If patient is discharged prior to next notation, then this note serves as note for discharge by case management.    Patient Name: Michelle Bright                   Date of Birth: April 13, 1950  Diagnosis: Bradycardia [R00.1]                   Date / Time: 03/31/2023  3:47 PM  Location: 4D-09/009-A     Patient Admission Status: Inpatient   Readmission Risk Low 0-14, Mod 15-19), High > 20: Readmission Risk Score: 13.8    Current PCP: Elton Sin, MD  Health Care Decision Makers:   Primary Decision Maker: Michelle Bright [phone]    Secondary Decision Maker: Michelle Bright - Child [phone]    Additional Case Management Notes: Presented to [facility] Michelle with bradycardia, fatigue, and palpitations for a week. Found to have possible CHB, started on dobutamine drip and transferred to [facility]. Dobutamine changed to dopamine drip, then changed to epi. Cardiology consulted. Echo ordered.     On 2L O2. Afebrile. SB 40's. PT/OT. Telemetry, SCDs. Epi @ 1 mcg/min, synthroid, prn percocet, prn IV compazine, Electrolyte replacement protocols. Blood cultures sent. BUN 45 - now 37, Creat 1.3 - now 1.1, nt pro-bnp 3337, CRP 1.53, trop 75, alk phos 125, total pro 6.3, tsh 7.35 - then 14.7, wbc 8.9 - now 12, hgb 11 - now 11.6, INR 2.65.     Procedures: none    Imaging:   3/8 CXR: Stable cardiomegaly with interstitial thickening.     Patient Goals/Plan/Treatment Preferences: Home w/husband and daughter. Has DME. Current with HH for RN/PT/OT; however, she is unsure what agency provides services. SW consulted.      04/01/23 1047   Service Assessment   Patient Orientation Alert and Oriented;Person;Place   Cognition Alert   History Provided By Patient   Primary Caregiver Self   Accompanied By/Relationship n/a   Support Systems Spouse/Significant Other;Children   Patient's Healthcare  Decision Maker is: Named in Scanned ACP Document   PCP Verified by CM Yes   Prior Functional Level Assistance with the following:;Bathing;Dressing;Toileting;Cooking;Housework;Shopping;Mobility   Current Functional Level Assistance with the following:;Bathing;Dressing;Toileting;Cooking;Shopping;Housework;Mobility   Can patient return to prior living arrangement Yes   Ability to make needs known: Good   Family able to assist with home care needs: Yes   Would you like for me to discuss the discharge plan with any other family members/significant others, and if so, who? No   Financial Resources [employer] ECF/Home Care  (Reports she is current [facility] for RN/PT/OT, but unsure what agency provides.)   Social/Functional History   Active Driver No   Patient's Driver Info Husband and daughter   Discharge Planning   Type of Residence House   Living Arrangements Spouse/Significant Other;Children   Current Services Prior To Admission Durable Medical Equipment;Home Care   Current DME Prior to [employer];Wheelchair;Shower Chair   Potential Assistance Needed Home Care;Skilled Nursing Facility   DME Ordered? No   Potential Assistance Purchasing Medications No   Type of Home Care Services Nursing Services;PT;OT   Patient expects to be discharged to: [facility] At/After Discharge   Confirm Follow Up Transport Family   Condition of Participation: Discharge Planning   The Plan for Transition  of Care is related to the following treatment goals: "Stand up and go home."

## 2023-04-02 LAB — CBC WITH AUTO DIFFERENTIAL
Basophils Absolute: 0.1 10*3/uL (ref 0.0–0.1)
Basophils: 1.3 %
Eosinophils Absolute: 0.3 10*3/uL (ref 0.0–0.4)
Eosinophils: 3.6 %
Hematocrit: 39.6 % (ref 37.0–47.0)
Hemoglobin: 11.7 g/dL — ABNORMAL LOW (ref 12.0–16.0)
Immature Grans (Abs): 0.03 10*3/uL (ref 0.00–0.07)
Immature Granulocytes %: 0.4 %
Lymphocytes Absolute: 2.2 10*3/uL (ref 1.0–4.8)
Lymphocytes: 27.7 %
MCH: 29.4 pg (ref 26.0–33.0)
MCHC: 29.5 g/dL — ABNORMAL LOW (ref 32.2–35.5)
MCV: 99.5 fL — ABNORMAL HIGH (ref 81.0–99.0)
MPV: 10 fL (ref 9.4–12.4)
Monocytes %: 10.3 %
Monocytes Absolute: 0.8 10*3/uL (ref 0.4–1.3)
Neutrophils Absolute: 4.4 10*3/uL (ref 1.8–7.7)
Platelets: 196 10*3/uL (ref 130–400)
RBC: 3.98 10*6/uL — ABNORMAL LOW (ref 4.20–5.40)
RDW-CV: 16.1 % — ABNORMAL HIGH (ref 11.5–14.5)
RDW-SD: 58.7 fL — ABNORMAL HIGH (ref 35.0–45.0)
Seg Neutrophils: 56.7 %
WBC: 7.8 10*3/uL (ref 4.8–10.8)
nRBC: 0 /100{WBCs}

## 2023-04-02 LAB — EKG 12-LEAD
Atrial Rate: 48 {beats}/min
Atrial Rate: 56 {beats}/min
Atrial Rate: 57 {beats}/min
P Axis: 106 degrees
P Axis: 63 degrees
P Axis: 80 degrees
P-R Interval: 208 ms
P-R Interval: 212 ms
P-R Interval: 206 ms
Q-T Interval: 536 ms
Q-T Interval: 556 ms
Q-T Interval: 504 ms
QRS Duration: 142 ms
QRS Duration: 144 ms
QRS Duration: 140 ms
QTc Calculation (Bazett): 496 ms
QTc Calculation (Bazett): 517 ms
QTc Calculation (Bazett): 490 ms
R Axis: -45 degrees
R Axis: 2 degrees
R Axis: 19 degrees
T Axis: -10 degrees
T Axis: 65 degrees
T Axis: 26 degrees
Ventricular Rate: 48 {beats}/min
Ventricular Rate: 56 {beats}/min
Ventricular Rate: 57 {beats}/min

## 2023-04-02 LAB — BASIC METABOLIC PANEL
BUN: 36 mg/dL — ABNORMAL HIGH (ref 8–23)
CO2: 28 meq/L (ref 22–29)
Calcium: 8.8 mg/dL (ref 8.8–10.2)
Chloride: 105 meq/L (ref 98–111)
Creatinine: 1 mg/dL — ABNORMAL HIGH (ref 0.5–0.9)
Glucose: 94 mg/dL (ref 74–109)
Potassium: 4.8 meq/L (ref 3.5–5.2)
Sodium: 142 meq/L (ref 135–145)

## 2023-04-02 LAB — MAGNESIUM: Magnesium: 2.1 mg/dL (ref 1.6–2.6)

## 2023-04-02 LAB — ANION GAP: Anion Gap: 9 meq/L (ref 8.0–16.0)

## 2023-04-02 LAB — PROTIME-INR: INR: 2.78 — ABNORMAL HIGH (ref 0.85–1.13)

## 2023-04-02 LAB — GLOMERULAR FILTRATION RATE, ESTIMATED: Est, Glom Filt Rate: 59 mL/min/{1.73_m2} — AB (ref >60)

## 2023-04-02 LAB — LACTIC ACID: Lactic Acid: 1 mmol/L (ref 0.5–2.0)

## 2023-04-02 MED ORDER — FUROSEMIDE 20 MG PO TABS
20 | Freq: Every day | ORAL | Status: DC
Start: 2023-04-02 — End: 2023-04-08
  Administered 2023-04-03 – 2023-04-07 (×5): 20 mg via ORAL

## 2023-04-02 MED ORDER — ALBUTEROL SULFATE (5 MG/ML) 0.5% IN NEBU
Freq: Four times a day (QID) | RESPIRATORY_TRACT | Status: DC | PRN
Start: 2023-04-02 — End: 2023-04-08

## 2023-04-02 MED FILL — FUROSEMIDE 20 MG PO TABS: 20 MG | ORAL | Qty: 1

## 2023-04-02 MED FILL — OXYCODONE-ACETAMINOPHEN 5-325 MG PO TABS: 5-325 MG | ORAL | Qty: 1

## 2023-04-02 MED FILL — SYNTHROID 125 MCG PO TABS: 125 MCG | ORAL | Qty: 1

## 2023-04-02 MED FILL — PROCHLORPERAZINE EDISYLATE 10 MG/2ML IJ SOLN: 102 MG/2ML | INTRAMUSCULAR | Qty: 2

## 2023-04-02 NOTE — Procedures (Signed)
 PROCEDURE NOTE  Date: 04/02/2023   Name: Michelle Bright  Date of Birth: 02/15/1950    Procedures    EKG completed

## 2023-04-02 NOTE — Other (Signed)
 RT Inhaler-Nebulizer Bronchodilator Protocol Note    There is a bronchodilator order in the chart from a provider indicating to follow the RT Bronchodilator Protocol and there is an "Initiate RT Inhaler-Nebulizer Bronchodilator Protocol" order as well (see protocol at bottom of note).    CXR Findings:  XR CHEST PORTABLE  Result Date: 04/01/2023  Impression: 1. Stable cardiomegaly with interstitial thickening. This document has been electronically signed by: Tenna Child, MD on 04/01/2023 07:06 AM      The findings from the last RT Protocol Assessment were as follows:   History Pulmonary Disease: Smoker 15 pack years or more  Respiratory Pattern: Regular pattern and RR 12-20 bpm  Breath Sounds: Clear breath sounds  Cough: Strong, spontaneous, non-productive  Indication for Bronchodilator Therapy: None  Bronchodilator Assessment Score: 1    Aerosolized bronchodilator medication orders have been revised according to the RT Inhaler-Nebulizer Bronchodilator Protocol below.    Respiratory Therapist to perform RT Therapy Protocol Assessment initially then follow the protocol.  Repeat RT Therapy Protocol Assessment PRN for score 0-3 or on second treatment, BID, and PRN for scores above 3.    No Indications - adjust the frequency to every 6 hours PRN wheezing or bronchospasm, if no treatments needed after 48 hours then discontinue using Per Protocol order mode.     If indication present, adjust the RT bronchodilator orders based on the Bronchodilator Assessment Score as indicated below.  Use Inhaler orders unless patient has one or more of the following: on home nebulizer, not able to hold breath for 10 seconds, is not alert and oriented, cannot activate and use MDI correctly, or respiratory rate 25 breaths per minute or more, then use the equivalent nebulizer order(s) with same Frequency and PRN reasons based on the score.  If a patient is on this medication at home then do not decrease Frequency below that used at  home.    0-3 - enter or revise RT bronchodilator order(s) to equivalent RT Bronchodilator order with Frequency of every 4 hours PRN for wheezing or increased work of breathing using Per Protocol order mode.        4-6 - enter or revise RT Bronchodilator order(s) to two equivalent RT bronchodilator orders with one order with BID Frequency and one order with Frequency of every 4 hours PRN wheezing or increased work of breathing using Per Protocol order mode.        7-10 - enter or revise RT Bronchodilator order(s) to two equivalent RT bronchodilator orders with one order with TID Frequency and one order with Frequency of every 4 hours PRN wheezing or increased work of breathing using Per Protocol order mode.       11-13 - enter or revise RT Bronchodilator order(s) to one equivalent RT bronchodilator order with QID Frequency and an Albuterol order with Frequency of every 4 hours PRN wheezing or increased work of breathing using Per Protocol order mode.      Greater than 13 - enter or revise RT Bronchodilator order(s) to one equivalent RT bronchodilator order with every 4 hours Frequency and an Albuterol order with Frequency of every 2 hours PRN wheezing or increased work of breathing using Per Protocol order mode.     RT to enter RT Home Evaluation for COPD & MDI Assessment order using Per Protocol order mode.    Electronically signed by Liz Beach, RCP on 04/02/2023 at 11:55 AM

## 2023-04-02 NOTE — Procedures (Signed)
 PROCEDURE NOTE  Date: 04/02/2023   Name: Michelle Bright  Date of Birth: 10/02/1950    Procedures  12 lead EKG completed. Results handed to [employer].

## 2023-04-02 NOTE — Progress Notes (Signed)
 Cardiology Progress Note      Patient:  Michelle Bright  Date of Birth: Dec 21, 1950  MRN: [id number]   Acct: [id number]  Admit Date:  03/31/2023  Primary Cardiologist:  Geradine Girt  Note per Dr Gabrielle Dare:  "    Subjective (Events in last 24 hours):   Pt awake, alert. Tired and sob today. Discussed about her HR, still very low as low as 30s with rest and into the low 40s while awake.     Objective:   BP (!) 157/35   Pulse 55   Temp 98.3 F (36.8 C) (Oral)   Resp 24   Wt 48.6 kg (107 lb 2.3 oz)   SpO2 92%   BMI 21.64 kg/m        TELEMETRY: sinus brady     Physical Exam:  General Appearance: alert and oriented to person, place and time, in no acute distress  Cardiovascular: brady rate, regular rhythm, normal S1 and S2, no murmurs, rubs, clicks, or gallops, distal pulses intact, no carotid bruits, no JVD  Pulmonary/Chest: clear to auscultation bilaterally- no wheezes, rales or rhonchi, normal air movement, no respiratory distress  Abdomen: soft, non-tender, non-distended, normal bowel sounds, no masses   Extremities: no cyanosis, clubbing or edema, pulse   Skin: warm and dry, no rash or erythema  Neurological: alert, oriented, normal speech, no focal findings or movement disorder noted    Medications:    sodium chloride flush  5-40 mL IntraVENous 2 times per day    [Held by provider] furosemide  20 mg Oral Daily    levothyroxine  125 mcg Oral Daily    [Held by provider] empagliflozin  10 mg Oral Daily      EPINEPHrine Stopped (04/01/23 1516)    sodium chloride 15 mL/hr at 04/01/23 0712     albuterol, 2.5 mg, Q6H PRN  oxyCODONE-acetaminophen, 1 tablet, Q6H PRN  sodium chloride flush, 5-40 mL, PRN  sodium chloride, , PRN  potassium chloride, 20 mEq, PRN   Or  potassium chloride, 10 mEq, PRN  magnesium sulfate, 2,000 mg, PRN  polyethylene glycol, 17 g, Daily PRN  acetaminophen, 650 mg, Q6H PRN   Or  acetaminophen, 650 mg, Q6H PRN  calcium gluconate, 2,000 mg, PRN  prochlorperazine, 10 mg, Q6H PRN        Diagnostics:  EKG:      Echo:   Interpretation Summary  Show Result Comparison     Left Ventricle: Normal left ventricular systolic function with a visually estimated EF of 55 - 60%. Left ventricle size is normal. Normal wall thickness. Normal wall motion. Abnormal diastolic function.    Aortic Valve: Mildly thickened cusps. Moderately calcified cusps. Mild regurgitation. 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    Mitral Valve: Mildly calcified leaflets. Mild regurgitation.    Tricuspid Valve: Mild regurgitation.    Left Atrium: Left atrium is moderately dilated.    Image quality is adequate.        Comparison Study Information    Prior Study    There is a prior study available for comparison. Prior study date: 03/23/2022.   Limited echocardiogram to assess left ventricular systolic function.  Compared to previous study the LVEF has improved.  Left Ventricle: Moderately reduced left ventricular systolic function with a visually estimated EF of 35 - 40%. (Calculated 36.7%) Left ventricle size is normal. Normal wall thickness. Moderate global hypokinesis present.   Normal right ventricular size and systolic function.  No pericardial effusion.  Echo Findings    Left Ventricle Normal left ventricular systolic function with a visually estimated EF of 55 - 60%. Left ventricle size is normal. Normal wall thickness. Normal wall motion. Abnormal diastolic function.   Left Atrium Left atrium is moderately dilated.   Right Ventricle Right ventricle size is normal. Normal systolic function.   Right Atrium Right atrium size is normal.   Aortic Valve Mildly thickened cusps. Moderately calcified cusps. Mild regurgitation. 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   Mitral Valve Mildly calcified leaflets. Mild regurgitation. No stenosis noted.   Tricuspid Valve Valve structure is normal. Mild regurgitation. No stenosis noted.   Pulmonic Valve The pulmonic valve visualization is suboptimal but appears to be functioning normally.  Physiologically normal regurgitation. No stenosis noted.   Aorta Normal sized aortic root and ascending aorta.   IVC/Hepatic Veins IVC diameter is less than or equal to 21 mm and decreases greater than 50% during inspiration; therefore the estimated right atrial pressure is normal (~3 mmHg). IVC size is normal.   Pericardium No pericardial effusion.     Stress:     Left Heart Cath:     Lab Data:    Cardiac Enzymes:  No results for input(s): "CKTOTAL", "CKMB", "CKMBINDEX", "TROPONINI" in the last 72 hours.    CBC:   Lab Results   Component Value Date/Time    WBC 7.8 04/02/2023 08:05 AM    RBC 3.98 04/02/2023 08:05 AM    RBC 17 01/21/2020 09:08 AM    HGB 11.7 04/02/2023 08:05 AM    HCT 39.6 04/02/2023 08:05 AM    PLT 196 04/02/2023 08:05 AM       CMP:    Lab Results   Component Value Date/Time    NA 142 04/02/2023 08:05 AM    K 4.8 04/02/2023 08:05 AM    K 5.2 12/24/2022 03:27 AM    CL 105 04/02/2023 08:05 AM    CO2 28 04/02/2023 08:05 AM    BUN 36 04/02/2023 08:05 AM    CREATININE 1.0 04/02/2023 08:05 AM    LABGLOM 59 04/02/2023 08:05 AM    LABGLOM 86 04/13/2022 12:16 PM    GLUCOSE 94 04/02/2023 08:05 AM    CALCIUM 8.8 04/02/2023 08:05 AM       Hepatic Function Panel:    Lab Results   Component Value Date/Time    ALKPHOS 125 04/01/2023 08:17 AM    ALT 15 04/01/2023 08:17 AM    AST 33 04/01/2023 08:17 AM    BILITOT 0.3 04/01/2023 08:17 AM    BILITOT Negative 01/21/2020 09:08 AM    BILIDIR 0.2 04/01/2023 08:17 AM       Magnesium:    Lab Results   Component Value Date/Time    MG 2.1 04/02/2023 08:05 AM       PT/INR:    Lab Results   Component Value Date/Time    INR 2.78 04/02/2023 08:05 AM       HgBA1c:    Lab Results   Component Value Date/Time    LABA1C 5.6 12/28/2022 06:02 AM       FLP:    Lab Results   Component Value Date/Time    TRIG 107 04/11/2015 05:15 AM    HDL 64 04/11/2015 05:15 AM       TSH:    Lab Results   Component Value Date/Time    TSH 14.70 04/01/2023 03:27 AM         Assessment:  PAF  Severe  sinus  brady, ? Tachy-brady syndrome?   Hx of LHC with patent coronaries 2023  Hx of NICMP, recovered, EF 55-60% per echo 04/01/2023  CKD3  HTN  Anemia    Plan:  No AVN blockers.     Resume lasix today.     Plan EP consult for possible pacemaker if needed. Has remained with bradycardia despite being off meds multiple days now.     Electronically signed by Regis Bill, PA-C on 04/02/2023 at 12:32 PM

## 2023-04-02 NOTE — Progress Notes (Addendum)
 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  Symptomatic bradycardia.  Improved : Likely due to medications including beta-blocker/amiodarone from paroxysmal A-fib.  No signs of acute infection noted.  Although TSH is high, patient has hypothyroidism and on Synthroid so TSH level unreliable.  T4 free within normal limits.  Cortisol levels within normal limits.  Pro-Cal/ESR/CRP and UA.  -Weaned off from epi drip, heart rate remains above 45.  -Cardiology following.  May need EP/cardiology eval  -Discussed with the cardiology team, since high risk for decompensation and episodes of significant/severe bradycardia, will continue ICU level of care for now.  If.  Patient continues to experiencing severe bradycardia episodes sustained at less than 30/min or symptoms returns back, epinephrine drip will be drug of choice to restart.  --AV blocking meds including beta-blocker Lopressor, and amiodarone was held  -Telemetry pulse ox  -Daily weights INO's.  Fall/aspiration precautions.  -Daily BMP mag.  CBC.  Will keep potassium around 4.0, mag around 2.0.  CHF HFrEF: Nonischemic cardiomyopathy, moderate MR.  Last echo dated 03/09/2022.  EF  35%.   -GDMT as able.  However due to symptomatic bradycardia, beta-blocker is held.  -Awaiting echo  -Awaiting cardiology recommendations.  -Daily weights INO's  -Daily BMP CBC.  Paroxysmal AF: Verifying home meds.  Metoprolol and amiodarone were held due to symptomatic bradycardia.  Cardiology following.  May need electrophysiology/cardiology eval. per report, patient is to be on warfarin at home.  Today's INR is 2.78 which is within target level for A-fib 2-3.0.  Awaiting cardiology recommendations regarding anticoagulant EEG.  If patient  eventually needs eval for pacemaker or EP, would be a better start heparin drip then later to switch to oral anticoagulation.  QTc prolongation: Noted.  Will avoid QTc prolonging medications.  Mild AKI on CKD stage III: Resolving.  Creatinine came down to 1.1 from 1.3.  Baseline looks 1.0.  Will continue monitoring.  Daily BMP.  Primary hypertension: Home meds are being verified.  Beta-blocker-metoprolol as held due to bradycardia.  Hypothyroidism: Synthroid resumed.  Mild macrocytic anemia: Anemia of chronic disease.  Checking with B12/folic acid levels.  Will continue monitoring.  No signs of acute bleeding noted.  Daily CBC  History of kyphosis with sacroplasty/kyphoplasty.  Noted  Deconditioning/debility.  Due to multiple chronic qualities.  PT OT.  Patient may benefit from social work including home outside or rehab.  Suspected chronic lung disease.  Chest x-ray positive for chronic interstitial thickening/fibrotic changes.  For now as needed albuterol.  Patient will benefit from PFTs at after discharge.  Moderate MR and TR.  Noted on last echo.  Positive for systolic murmur.  Monitoring.  Contributing to CHF.  INITIAL H AND P AND ICU COURSE:  Initial HPI Per EMR. 73 year old female past medical history nonischemic cardiomyopathy, hypertension, hypothyroidism, paroxysmal atrial fibrillation presenting from outside hospital for symptomatic bradycardia.  Has been having fatigue and palpitations for approximately 1 week.  Found to have a significantly low heart rate with concern for complete heart block outside facility.  Was started on dobutamine and transferred to [facility] for further evaluation.  On arrival to [facility], patient was switched from dobutamine to dopamine.  Marland Kitchen  Answering questions appropriately.  Cardiology consulted  for further evaluation.     04/01/2023.  Patient seen at the bedside.  She is awake, alert oriented X.3.  Throughout the night, dobutamine weaned off and started dopamine, however due to high  sensitivity, patient's systolic blood pressure went up around 170-80s.  Then dopamine weaned off and started epinephrine low-dose.  Cardiology consulted.  Awaiting recommendations.    04/02/2023.  Patient seen at the bedside.  No significant events overnight reported.  Per patient, symptoms significantly improved, patient is off the epinephrine drip.  Cardiology following, cardiology okay to transfer to stepdown once epinephrine drip is off and heart rate above 40 .    Past Medical History: Hypertension.  Family History: Hypertension.  Social History: Denies illicit drugs.    ROS   General.  Complains of fatigue, heart palpitations.  Denies fever chills.  Cardiovascular.  Complains of fatigue, heart palpitations.  Denies chest pain.  Pulmonary.  Denies productive cough, fever or chills.  Gastroc enterology.  Denies constipation diarrhea abdominal pain  Genitourinary.  Denies dysuria  Psychiatric.  Denies suicidal thoughts.  Neurology.  Neurology.  Denies recent vision/hearing changes.    Scheduled Meds:   sodium chloride flush  5-40 mL IntraVENous 2 times per day    [Held by provider] furosemide  20 mg Oral Daily    levothyroxine  125 mcg Oral Daily    [Held by provider] empagliflozin  10 mg Oral Daily     Continuous Infusions:   EPINEPHrine Stopped (04/01/23 1516)    sodium chloride 15 mL/hr at 04/01/23 0981       PHYSICAL EXAMINATION:  T: 98.8.  P: 50. RR: 16. B/P: 118/42.  FiO2: Room air. O2 Sat: 90%.  I/O: Pending  Body mass index is 21.64 kg/m.   GCS:   15  On room air  General:   Ill looking.  Somewhat malnourished  HEENT:  normocephalic and atraumatic.  No scleral icterus. PERR  Neck: supple.  No Thyromegaly.  Lungs: Diminished breath sounds bilaterally.  No wheezings.  Cardiac: Mild bradycardia.  No JVD.  Systolic murmur  Abdomen: soft.  Nontender.  Extremities:  No clubbing, cyanosis, or edema x 4.    Vasculature: capillary refill < 3 seconds. Palpable dorsalis pedis pulses.  Skin:  warm and dry.  Psych:   Alert and oriented x3.  Affect appropriate  Lymph:  No supraclavicular adenopathy.  Neurologic:  No focal deficit. No seizures.    Data: (All radiographs, tracings, PFTs, and imaging are personally viewed and interpreted unless otherwise noted).   Sodium 140, potassium 4.8, creatinine 1.1, BUN 37, GFR 53  proBNP 3337.0  WBC 12.0, RBC 3.9, hemoglobin 11, MCV 101, platelet count 237  Telemetry shows mild bradycardia, heart rate around 50-52.   Chest x-ray.  Cardiomegaly.  Underlying chronic fibrotic lung changes.  EKG.  LBBB.  Wide QRS.        Seen with multidisciplinary ICU team .  Meets Continued ICU Level Care Criteria:    [x]  Yes   []  No - Transfer Planned to listed location: Stepdown.  []  HOSPITALIST CONTACTED- Dr.     Case and plan discussed with Dr. Jenetta Downer MD Pulm/CC.  And  cardiology team.        Electronically signed by Carl Best, DO  Internal Medicine

## 2023-04-03 LAB — APTT
aPTT: 31.6 s (ref 22.0–38.0)
aPTT: 31.5 s (ref 22.0–38.0)

## 2023-04-03 LAB — CBC
Hematocrit: 44 % (ref 37.0–47.0)
Hematocrit: 41.7 % (ref 37.0–47.0)
Hemoglobin: 12.7 g/dL (ref 12.0–16.0)
Hemoglobin: 12.7 g/dL (ref 12.0–16.0)
MCH: 29.6 pg (ref 26.0–33.0)
MCH: 29.7 pg (ref 26.0–33.0)
MCHC: 28.9 g/dL — ABNORMAL LOW (ref 32.2–35.5)
MCHC: 30.5 g/dL — ABNORMAL LOW (ref 32.2–35.5)
MCV: 102.6 fL — ABNORMAL HIGH (ref 81.0–99.0)
MCV: 97.7 fL (ref 81.0–99.0)
MPV: 10.7 fL (ref 9.4–12.4)
MPV: 10.7 fL (ref 9.4–12.4)
Platelets: 199 10*3/uL (ref 130–400)
Platelets: 208 10*3/uL (ref 130–400)
RBC: 4.29 10*6/uL (ref 4.20–5.40)
RBC: 4.27 10*6/uL (ref 4.20–5.40)
RDW-CV: 15.9 % — ABNORMAL HIGH (ref 11.5–14.5)
RDW-CV: 16.1 % — ABNORMAL HIGH (ref 11.5–14.5)
RDW-SD: 60.9 fL — ABNORMAL HIGH (ref 35.0–45.0)
RDW-SD: 58.2 fL — ABNORMAL HIGH (ref 35.0–45.0)
WBC: 10.8 10*3/uL (ref 4.8–10.8)
WBC: 11 10*3/uL — ABNORMAL HIGH (ref 4.8–10.8)

## 2023-04-03 LAB — CALCIUM, IONIZED: Calcium, Ionized: 1.01 mmol/L — ABNORMAL LOW (ref 1.12–1.32)

## 2023-04-03 LAB — BASIC METABOLIC PANEL
BUN: 38 mg/dL — ABNORMAL HIGH (ref 8–23)
CO2: 29 meq/L (ref 22–29)
Calcium: 8.3 mg/dL — ABNORMAL LOW (ref 8.8–10.2)
Chloride: 104 meq/L (ref 98–111)
Creatinine: 1 mg/dL — ABNORMAL HIGH (ref 0.5–0.9)
Glucose: 97 mg/dL (ref 74–109)
Potassium: 4.9 meq/L (ref 3.5–5.2)
Sodium: 140 meq/L (ref 135–145)

## 2023-04-03 LAB — ANION GAP: Anion Gap: 7 meq/L — ABNORMAL LOW (ref 8.0–16.0)

## 2023-04-03 LAB — PROTIME-INR
INR: 1.66 — ABNORMAL HIGH (ref 0.85–1.13)
INR: 1.61 — ABNORMAL HIGH (ref 0.85–1.13)

## 2023-04-03 LAB — ANTI-XA, HEPARIN
Heparin Unfractionated: 0.05 U/mL — ABNORMAL LOW (ref 0.30–0.70)
Heparin Unfractionated: 0.06 U/mL — ABNORMAL LOW (ref 0.30–0.70)

## 2023-04-03 LAB — GLOMERULAR FILTRATION RATE, ESTIMATED: Est, Glom Filt Rate: 59 mL/min/{1.73_m2} — AB (ref >60)

## 2023-04-03 MED ORDER — AMLODIPINE BESYLATE 5 MG PO TABS
5 | Freq: Every day | ORAL | Status: DC
Start: 2023-04-03 — End: 2023-04-04
  Administered 2023-04-03 – 2023-04-04 (×2): 5 mg via ORAL

## 2023-04-03 MED ORDER — LIDOCAINE 4 % EX PTCH
4 | Freq: Every day | CUTANEOUS | Status: DC
Start: 2023-04-03 — End: 2023-04-08
  Administered 2023-04-03 – 2023-04-08 (×6): 1 via TRANSDERMAL

## 2023-04-03 MED ORDER — HYDRALAZINE HCL 20 MG/ML IJ SOLN
20 | Freq: Four times a day (QID) | INTRAMUSCULAR | Status: DC | PRN
Start: 2023-04-03 — End: 2023-04-08

## 2023-04-03 MED ORDER — HEPARIN SOD (PORCINE) IN D5W 100 UNIT/ML IV SOLN
100 | INTRAVENOUS | Status: DC
Start: 2023-04-03 — End: 2023-04-03

## 2023-04-03 MED ORDER — HEPARIN SODIUM (PORCINE) 1000 UNIT/ML IJ SOLN
1000 | INTRAMUSCULAR | Status: DC | PRN
Start: 2023-04-03 — End: 2023-04-03

## 2023-04-03 MED ORDER — HYDRALAZINE HCL 20 MG/ML IJ SOLN
20 | Freq: Once | INTRAMUSCULAR | Status: AC
Start: 2023-04-03 — End: 2023-04-03
  Administered 2023-04-03: 14:00:00 5 mg via INTRAVENOUS

## 2023-04-03 MED ORDER — HEPARIN SODIUM (PORCINE) 1000 UNIT/ML IJ SOLN
1000 | Freq: Once | INTRAMUSCULAR | Status: DC
Start: 2023-04-03 — End: 2023-04-03

## 2023-04-03 MED FILL — FUROSEMIDE 20 MG PO TABS: 20 MG | ORAL | Qty: 1 | Fill #0

## 2023-04-03 MED FILL — HYDRALAZINE HCL 20 MG/ML IJ SOLN: 20 MG/ML | INTRAMUSCULAR | Qty: 1 | Fill #0

## 2023-04-03 MED FILL — AMLODIPINE BESYLATE 5 MG PO TABS: 5 MG | ORAL | Qty: 1 | Fill #0

## 2023-04-03 MED FILL — LIDOCARE ARM/NECK/LEG 4 % EX PTCH: 4 % | CUTANEOUS | Qty: 1 | Fill #0

## 2023-04-03 MED FILL — OXYCODONE-ACETAMINOPHEN 5-325 MG PO TABS: 5-325 MG | ORAL | Qty: 1 | Fill #0

## 2023-04-03 MED FILL — SYNTHROID 125 MCG PO TABS: 125 MCG | ORAL | Qty: 1 | Fill #0

## 2023-04-03 NOTE — Progress Notes (Addendum)
 Comprehensive Nutrition Assessment    Type and Reason for Visit:  Initial, Positive nutrition screen, Wound    Nutrition Recommendations/Plan:    Suggest SLP swallow eval to insure swallowing safety. Discussed with Susa Raring.  Will downgrade diet from regular to Dysphagia Soft & Bite Sized  No Bread ( fluid restriction) per pt request.  Send Mighty Shake TID.   Consider MVI as appropriate.       Malnutrition Assessment:  Malnutrition Status:  At risk for malnutrition (04/03/23 1520)    Context:  Acute Illness     Findings of the 6 clinical characteristics of malnutrition:  Energy Intake:   (po 76-100% at lunch hoever pt mentions intake has been poor less than 75% a few days PTA)  Weight Loss:  Unable to assess (HF pt ( pt weighs 10# more than she did 10/13/22))     Body Fat Loss:  No body fat loss     Muscle Mass Loss:  Mild muscle mass loss Temples (temporalis)  Fluid Accumulation:  Mild Extremities  Grip Strength:  Not Performed    Nutrition Assessment:     Pt. nutritionally compromised AEB  swallowing difficulty per pt.  At risk for further nutrition compromise r/t admit with Bradycardia, Paroxysmal Atrial Fib, Hypocalcemia, HF, CKD III, HTN, increased needs for wound healing and underlying medical condition (Atrial Fib, CVA, CAD, HTN, Osteoporosis, Thyroid Disease).       Nutrition Related Findings:    Pt. Report/Treatments/Miscellaneous: Pt seen, mentions appetite has improved however mentions had poor appetite?intake for a few days  PTA. Pt mentions she has swallowing difficulty, mentions she cannot swallow bread &  reports she would like dysphagia soft & bite sized diet. She doesn't remember having a SLP swallow eval in the past. Per diet hx, pt tends to consume small meals. Diet appears to lack fruits & vegetables.   Pt mentions she tries to drink some Ensure at home but prefers trying Mighty Shake due to smaller as well as pt on a fluid restriction.   GI Status: BM x 2 (3/9)  Pertinent Labs: (3/10) BUN  38, Creatinine 1, Ca 8.3, (3/8) Alk Phos125  Pertinent Meds: Synthroid, Lasix     Wound Type: Stage II (ankle distal; left posterior)       Current Nutrition Intake & Therapies:    Average Meal Intake: 76-100% (1 meal, missing po)  Average Supplements Intake: 0% (lunch per tray observation)  ADULT DIET; Dysphagia - Soft and Bite Sized; 2000 ml  ADULT ORAL NUTRITION SUPPLEMENT; Breakfast, Lunch, Dinner; Standard 4 oz Oral Supplement    Anthropometric Measures:  Height: 149.9 cm (4' 11.02")  Ideal Body Weight (IBW): 95 lbs (43 kg)    Admission Body Weight: 48 kg (105 lb 13.1 oz) ((3/7) trace RLE & LLE edema bedscale)  Current Body Weight: 50.2 kg (110 lb 10.7 oz) ((3/10) trace RLE & LLE edema bedscale), 116.5 % IBW.    Current BMI (kg/m2): 22.3  Usual Body Weight:  (per EMR: (12/30/22) 153# 14oz bedscale, (10/13/22) 100# 11oz, (07/05/22) 91# 8oz, (01/05/22) 90# 13oz actual)                          BMI Categories: Normal Weight (BMI 18.5-24.9)    Estimated Daily Nutrient Needs:  Energy Requirements Based On: Kcal/kg  Weight Used for Energy Requirements:  (48kgm (3/7))  Energy (kcal/day): 1104-1200 (23-25) ICU pt  Weight Used for Protein Requirements:  (48kgm (3/7))  Protein (  g/day): 58+ grams as renal function tolerates for wound healing (1.2+ grams protein/kgm)     Fluid (ml/day):  fluid restriction per provider orders.    Nutrition Diagnosis:    (Swallowing difficulty) related to swallowing difficulty as evidenced by  (per pt report)    Nutrition Interventions:   Food and/or Nutrient Delivery:  (Suggest SLP swallow eval to insure swallowing safety as appropriate. Discussed with RN.)  Nutrition Education/Counseling: Education/Counseling not appropriate  Coordination of Nutrition Care: Continue to monitor while inpatient, Speech Therapy, Swallow Evaluation, Interdisciplinary Rounds       Goals:  Goals: PO intake 75% or greater, by next RD assessment  Type of Goal: New goal       Nutrition Monitoring and  Evaluation:      Food/Nutrient Intake Outcomes: Diet Advancement/Tolerance, Food and Nutrient Intake, Supplement Intake, Vitamin/Mineral Intake  Physical Signs/Symptoms Outcomes: Biochemical Data, Chewing or Swallowing, GI Status, Fluid Status or Edema, Hemodynamic Status, Nutrition Focused Physical Findings, Skin, Weight    Discharge Planning:    Too soon to determine     Fernande Boyden, RD, LD  Contact: [phone]

## 2023-04-03 NOTE — Progress Notes (Signed)
 Patient received sacramental anointing by a priest. If you are in need of chaplain support, please call [phone]. If you are in need of a chaplain after 6:30pm, please call the house supervisor for the on-call chaplain.      Zenia Resides  Spiritual Health Coordinator  [phone]

## 2023-04-03 NOTE — Progress Notes (Signed)
 [facility]  INPATIENT OCCUPATIONAL THERAPY  [facility] ICU 4D  EVALUATION      Discharge Recommendations: Continue to assess pending progress (would benefit from rehab stay before returning home)  Equipment Recommendations:   will continue to assess pending progress, discharge location    Time In: 1129  Time Out: 1148  Timed Code Treatment Minutes: 8 Minutes  Minutes: 19          Date: 04/03/2023  Patient Name: Michelle Bright,   Gender: female      MRN: [id number]  DOB: 23-Jul-1950  (72 y.o.)  Referring Practitioner: William Hamburger, Hoshimjon J, DO  Diagnosis: Bradycardia  Additional Pertinent Hx: Per H&P: 73 year old female past medical history nonischemic cardiomyopathy, hypertension, hypothyroidism, paroxysmal atrial fibrillation presenting from outside hospital for symptomatic bradycardia.  Has been having fatigue and palpitations for approximately 1 week.  Found to have a significantly low heart rate with concern for complete heart block outside facility.  Was started on dobutamine and transferred to [facility] for further evaluation.  On arrival to [facility], patient was switched from dobutamine to dopamine.    Restrictions/Precautions:  Restrictions/Precautions: Fall Risk    Subjective  Chart Reviewed: Yes, Orders, Progress Notes, History and Physical  Patient assessed for rehabilitation services?: Yes  Family / Caregiver Present: No    Subjective: Pt seated in bed upon arrival, agreeable to OT session with encouragement. Pt self limiting throughout session, significant fear of falling.  **Rn reports pt has not had any bradycardia, has orders to transfer to stepdown unit.    Pain: Pt c/o pain in R knee and L shoulder initially, then as session progressed c/o back pain.     Vitals: Blood Pressure: 167/45  Oxygen: 91% on RA  Heart Rate: 64 bpm  All vitals remained stable throughout    Social/Functional History:  Lives With: Spouse, Daughter  Type of Home: House  Home Layout: One level  Home Access: Stairs to enter  without rails  Entrance Stairs - Number of Steps: 3  Home Equipment: Designer, industrial/product, Wheelchair - Manual   Bathroom Shower/Tub: Medical sales representative: Handicap height  Bathroom Equipment: Grab bars in Neurosurgeon Help From: Home health [facility] RN prior)  Prior Level of Assist for ADLs: Needs assistance (pt reported normally tries to complete all ADLs on own, although frequently requires assist with toileting and showering tasks)  Prior Level of Assist for Homemaking: Needs assistance  Prior Level of Assist for Transfers: Needs assistance (occasional assist)  Prior Level of Assist for Ambulation: Independent household ambulator, with or without device (always has assist/supervision with walking)    Active Driver: No  Patient's Driver Info: Husband and daughter     Additional Comments: Question accuracy of pt's reported PLOF. Pt reported primarily uses RW for all mobility, although later in session reported uses w/c primarily. Pt reported husband or daughter are home all the time with her    VISION:Corrected    HEARING:  WFL    COGNITION: Decreased Recall, Decreased Insight, Impaired Memory, Decreased Problem Solving, and Decreased Safety Awareness    RANGE OF MOTION:  Bilateral Upper Extremity:  Impaired - decreased at shoulders; distally WFL    STRENGTH:  Bilateral Upper Extremity:  Impaired - significantly deconditioned    ADL:   No ADL's completed this session.Marland Kitchen    IADL:   Not Tested    BALANCE:  Sitting Balance:  Moderate Assistance. X1 while seated at EOB for ~5 minutes, then  pt requesting to return to supine due to increased fatigue and back pain. Despite further encouragement, pt continued to request to return to supine.  **Pt constantly holding OTR's hand while seated at EOB due to fear of falling    BED MOBILITY:  Supine to Sit: Maximum Assistance, X 1, with head of bed raised, with verbal cues , with increased time for completion    Sit to Supine: Maximum Assistance, X 1, with head of bed  raised, with verbal cues , with increased time for completion    Scooting: Maximum Assistance, with verbal cues , with increased time for completion ; advancing hips forward to EOB    TRANSFERS:  Will progress/complete as able    FUNCTIONAL MOBILITY:  Will progress/complete as able    Activity Tolerance:  Patient tolerance of  treatment: Poor treatment tolerance, Limited by pain, Limited by fatigue, Reduced activity pace, and Need for increased rest breaks       AM-PAC Inpatient Daily Activity Raw Score: 11     Modified Rankin:  Premorbid Functional Status: Not Applicable  Current Functional Status:  Not Applicable    Assessment:  Assessment: Pt presents requiring increased assistance for ADLs, transfers, and functional mobility compared to PLOF. Pt will continue to benefit from OT services to improve independence with these tasks, in addition to improving overall strength/endurance to facilitate return to PLOF. Without OT services, pt is at increased risk for falls/deconditioning, hospital readmission, and potential increase in caregiver burden.     Performance deficits / Impairments: Decreased functional mobility , Decreased ADL status, Decreased strength, Decreased cognition, Decreased endurance, Decreased balance  Prognosis: Fair  REQUIRES OT FOLLOW-UP: Yes  Decision Making: High Complexity    Treatment Initiated: Treatment and education initiated within context of evaluation.  Evaluation time included review of current medical information, gathering information related to past medical, social and functional history, completion of standardized testing, formal and informal observation of tasks, assessment of data and development of plan of care and goals.  Treatment time included skilled education and facilitation of tasks to increase safety and independence with ADL's for improved functional independence and quality of life.    Patient Education:          Patient Education  Education Given To: Patient  Education  Provided: Role of Therapy;Plan of Care (importance of increasing activity/particiption in therapy)  Education Method: Demonstration;Verbal  Barriers to Learning: Cognition  Education Outcome: Verbalized understanding;Continued education needed    Plan:  Times Per Week: 5x  Current Treatment Recommendations: Strengthening, ROM, Balance training, Functional mobility training, Endurance training, Safety education & training, Patient/Caregiver education & training, Equipment evaluation, education, & procurement, Self-Care / ADL.  See long-term goal time frame for expected duration of plan of care.  If no long-term goals established, a short length of stay is anticipated.    Goals:     Short Term Goals  Time Frame for Short Term Goals: by discharge  Short Term Goal 1: Pt will progress with completion of transfers/functional mobilityas able.  Short Term Goal 2: Pt will complete BADL tasks with minimal assistance to increase independence with self care tasks.  Short Term Goal 3: Pt will tolerate dynamic sitting at EOB X10 minutes with consistent SBA in prep for ADL completion.  Long Term Goals  Time Frame for Long Term Goals : not set due to ELOS    AM-PAC Inpatient Daily Activity Raw Score: 11  AM-PAC Inpatient ADL T-Scale Score : 29.04    Following  session, patient left in safe position with all fall risk precautions in place.

## 2023-04-03 NOTE — Plan of Care (Addendum)
 Internal medicine resident plan of care:    Patient is 73 year old female with history of nonischemic cardiomyopathy, hypertension, hypothyroidism, paroxysmal atrial fibrillation, CKD 3.  She complains of palpitations of approximately 1 week duration prior to admission.  She is transferred to [facility] from [facility], and is admitted to ICU.  At [facility] [place], had EKG showing third-degree heart block, rate 37.  She was complaining of dizziness, fatigue.  Was started on dobutamine on [facility] arrival, switched to epinephrine gtt.  As of 3/10, inotropic infusions off.  Patient was evaluated with heart rate stable in 60s.  On interview, she complains of knee pain; ordered lidocaine patch.  She denies chest pain, palpitations.  She endorses some mild shortness of breath.  She is hypertensive, SBP 190s, with regular rhythm.  She has hydralazine 10 mg IV prn q6h ordered, and had amlodipine 5 mg started 3/10 by cardiology.  On 3/8, cardiology consulted.  They recommended continuing to keep AV nodal and beta blockers held.  Cardiology also recommended electrophysiology consultation, and are considering pacer.  INR found to be subtherapeutic 3/10, telemetry for atrial fibrillation CV4 ordered.    Addendum: Confirmed with cardiology: no heparin, or OAC for now due to plan for PPM. 3/10 6:27pm        Electronically signed by Vashti Hey, DO IM PGY-2 on 04/03/2023.

## 2023-04-03 NOTE — Other (Signed)
 1745 informed husband rocky by phone that [place] transferred to [phone]. Gave him phone number to 4K unit.

## 2023-04-03 NOTE — Progress Notes (Signed)
 Cardiology Progress Note      Patient:  Michelle Bright  Date of Birth: Jul 15, 1950  MRN: [id number]   Acct: [id number]  Admit Date:  03/31/2023  Primary Cardiologist: Geradine Girt   Seen by dr. Gabrielle Dare    Per prior cardiology consult note-  REASON FOR CONSULTATION:  Symptomatic bradycardia.     HISTORY OF PRESENT ILLNESS:  This is a pleasant 73 year old lady, who apparently follows with Dr. Geradine Girt, who has a history of known atrial fibrillation, who came in with symptoms of fatigue, tiredness, palpitation, and no obvious syncope.  Was found to have significant bradycardia.  Was admitted for a diagnosis of symptomatic bradycardia.  She initially presented to outlying hospital and was subsequently transferred to [facility].  The patient has been maintained on amiodarone and metoprolol as an outpatient which has been on hold.  Denies any active chest pain.  Does have some baseline shortness of breath.      Subjective (Events in last 24 hours):   Pt in bed   Tele SR hr 60's     BP elevated   Pt co headache at present     Per family pt taking toprol 12.5  amio 200 day    Pt states no chest pains recently -- she does correspond dizziness recently     Per husband pt has been having spells of staring and not responding  Lately       Pt agreeable to PPM     Objective:   BP (!) 145/50   Pulse 64   Temp 98.2 F (36.8 C) (Oral)   Resp 22   Wt 50.2 kg (110 lb 10.7 oz)   SpO2 95%   BMI 22.35 kg/m        TELEMETRY: SR no ectopy HR 60's    Physical Exam:  General Appearance: alert and oriented to person, place- , in no acute distress  Cardiovascular: normal rate, regular rhythm, normal S1 and S2, no murmurs, rubs, clicks, or gallops, distal pulses intact  Pulmonary/Chest: clear to auscultation bilaterally- no wheezes, rales or rhonchi, normal air movement, no respiratory distress  Abdomen: soft, non-tender, non-distended, normal bowel sounds, no masses Extremities: no cyanosis, clubbing or edema, pulses present     Musculoskeletal: normal range of motion, no joint swelling, deformity or tenderness  Neurological: alert, oriented to self and family and place- , normal speech, no focal findings or movement disorder noted    Medications:    furosemide  20 mg Oral Daily    sodium chloride flush  5-40 mL IntraVENous 2 times per day    levothyroxine  125 mcg Oral Daily    [Held by provider] empagliflozin  10 mg Oral Daily      sodium chloride 15 mL/hr at 04/01/23 0102     hydrALAZINE, 10 mg, Q6H PRN  albuterol, 2.5 mg, Q6H PRN  oxyCODONE-acetaminophen, 1 tablet, Q6H PRN  sodium chloride flush, 5-40 mL, PRN  sodium chloride, , PRN  potassium chloride, 20 mEq, PRN   Or  potassium chloride, 10 mEq, PRN  magnesium sulfate, 2,000 mg, PRN  polyethylene glycol, 17 g, Daily PRN  acetaminophen, 650 mg, Q6H PRN   Or  acetaminophen, 650 mg, Q6H PRN  calcium gluconate, 2,000 mg, PRN  prochlorperazine, 10 mg, Q6H PRN        Diagnostics:    Echo: 04/01/23  Left Ventricle Normal left ventricular systolic function with a visually estimated EF of 55 - 60%. Left ventricle size is normal.  Normal wall thickness. Normal wall motion. Abnormal diastolic function.   Left Atrium Left atrium is moderately dilated.   Right Ventricle Right ventricle size is normal. Normal systolic function.   Right Atrium Right atrium size is normal.   Aortic Valve Mildly thickened cusps. Moderately calcified cusps. Mild regurgitation. 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   Mitral Valve Mildly calcified leaflets. Mild regurgitation. No stenosis noted.   Tricuspid Valve Valve structure is normal. Mild regurgitation. No stenosis noted.   Pulmonic Valve The pulmonic valve visualization is suboptimal but appears to be functioning normally. Physiologically normal regurgitation. No stenosis noted.   Aorta Normal sized aortic root and ascending aorta.   IVC/Hepatic Veins IVC diameter is less than or equal to 21 mm and decreases greater than 50% during inspiration; therefore  the estimated right atrial pressure is normal (~3 mmHg). IVC size is normal.   Pericardium No pericardial effusion.           Left Heart Cath: 12/22/21  Patent coronaries, abnormal LV function   EF 35-40%  No complications     Diagnostic  Dominance: Right  No diagnostic findings have been documented.  Intervention    No interventions have been documented.  Left Heart    Left Ventricle There is moderate left ventricular systolic dysfunction. The estimated EF = 35 - 40%.     Coronary Diagram    Diagnostic  Dominance: Right    Lab Data:    Cardiac Enzymes:  No results for input(s): "CKTOTAL", "CKMB", "CKMBINDEX", "TROPONINI" in the last 72 hours.    CBC:   Lab Results   Component Value Date/Time    WBC 10.8 04/03/2023 11:33 AM    RBC 4.29 04/03/2023 11:33 AM    RBC 17 01/21/2020 09:08 AM    HGB 12.7 04/03/2023 11:33 AM    HCT 44.0 04/03/2023 11:33 AM    PLT 199 04/03/2023 11:33 AM       CMP:    Lab Results   Component Value Date/Time    NA 140 04/03/2023 03:17 AM    K 4.9 04/03/2023 03:17 AM    K 5.2 12/24/2022 03:27 AM    CL 104 04/03/2023 03:17 AM    CO2 29 04/03/2023 03:17 AM    BUN 38 04/03/2023 03:17 AM    CREATININE 1.0 04/03/2023 03:17 AM    LABGLOM 59 04/03/2023 03:17 AM    LABGLOM 86 04/13/2022 12:16 PM    GLUCOSE 97 04/03/2023 03:17 AM    CALCIUM 8.3 04/03/2023 03:17 AM       Hepatic Function Panel:    Lab Results   Component Value Date/Time    ALKPHOS 125 04/01/2023 08:17 AM    ALT 15 04/01/2023 08:17 AM    AST 33 04/01/2023 08:17 AM    BILITOT 0.3 04/01/2023 08:17 AM    BILITOT Negative 01/21/2020 09:08 AM    BILIDIR 0.2 04/01/2023 08:17 AM       Magnesium:    Lab Results   Component Value Date/Time    MG 2.1 04/02/2023 08:05 AM       PT/INR:    Lab Results   Component Value Date/Time    INR 2.78 04/02/2023 08:05 AM       HgBA1c:    Lab Results   Component Value Date/Time    LABA1C 5.6 12/28/2022 06:02 AM       FLP:    Lab Results   Component Value Date/Time    TRIG 107 04/11/2015  05:15 AM    HDL 64  04/11/2015 05:15 AM       TSH:    Lab Results   Component Value Date/Time    TSH 14.70 04/01/2023 03:27 AM         Assessment:    Pt staring off while walking - why she came to hospital   Hx CVA  ? CVA workup - no new deficits per family   Or related to bradycardia - EKG noted junctional with HR 38 at OSH     PAFB  / AFL -- at present SR HR 60's  - failed flecainide in past   was on amiodarone started per [facility] 09/2022 for recurrent AFB - remains on amio 200 po day / toprol 12.5 day  CHADS2VASC 6 on OAC ? 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 Tachy induced CDMP  Normal cath 11/2021      Ckd stage 3   Hx thyroidectomy       Plan:  EP to see for possible PPM need- no IV heparin as in SR at  present  - pt on amio / toprol / OAC   Mainlined SR with HR 60's   HTN - start norvasc at present          Electronically signed by Michae Kava, APRN - CNP on 04/03/2023 at 11:59 AM

## 2023-04-03 NOTE — Plan of Care (Signed)
 Problem: Chronic Conditions and Co-morbidities  Goal: Patient's chronic conditions and co-morbidity symptoms are monitored and maintained or improved  Outcome: Progressing  Flowsheets (Taken 04/02/2023 1935)  Care Plan - Patient's Chronic Conditions and Co-Morbidity Symptoms are Monitored and Maintained or Improved: Monitor and assess patient's chronic conditions and comorbid symptoms for stability, deterioration, or improvement     Problem: Safety - Adult  Goal: Free from fall injury  Outcome: Progressing     Problem: Discharge Planning  Goal: Discharge to home or other facility with appropriate resources  Outcome: Progressing  Flowsheets (Taken 04/02/2023 1935)  Discharge to home or other facility with appropriate resources: Identify barriers to discharge with patient and caregiver     Problem: Pain  Goal: Verbalizes/displays adequate comfort level or baseline comfort level  Outcome: Progressing  Flowsheets (Taken 04/02/2023 1900)  Verbalizes/displays adequate comfort level or baseline comfort level: Encourage patient to monitor pain and request assistance

## 2023-04-03 NOTE — Plan of Care (Signed)
 Patient stable to transition out of ICU.  Going to 4K 9.  Hospitalist, Dr. Earle Gell made aware via [employer] regarding the transfer.    Electronically Signed by  Verlee Rossetti DO, MBA  PGY-2 Internal Medicine Resident  [facility]  On 04/03/2023 at 12:37 PM    This report has been created using voice recognition software. It may contain minor errors which are inherent in voice recognition technology

## 2023-04-03 NOTE — Progress Notes (Signed)
 CRITICAL CARE PROGRESS NOTE      Patient:  Michelle Bright    Unit/Bed:4D-09/009-A  Date of Birth: July 30, 1950  MRN: [id number]   PCP: Elton Sin, MD  Date of Admission: 03/31/2023  Chief Complaint:-Fatigue, palpitations    Assessment and Plan:    Symptomatic bradycardia: 1 week history of fatigue and palpitations. Reported as CHB at outside facility, placed on dobumatine and transferred to [facility]. On amiodarone/Toprol for pAF.  EKG here showing sinus bradycardia with LBBB.  Was on dopamine on admission, switched to Epi gtt. Now off drips  Hold amiodarone and metoprolol, allow for medication washout  Cardiology consulted, appreciate recommendations  EP consult  Paroxysmal atrial fibrillation: CHA2DS2-VASc 6.  Rhythm controlled with amiodarone.  Rate controlled with Toprol.  Anticoagulated with warfarin.  Has follow-up with Dr. Corrinne Eagle, electrophysiologist in the past.  Hold amiodarone and Toprol in setting of symptomatic bradycardia described above  Hold warfarin  Heparin gtt has not been started as INR yesterday was 2.78  Recheck INR today, start heparin gtt if appropriate  Prolonged QTc: Likely from chronic left bundle branch block. Continue telemetry.  Avoiding QTc prolonging agents as possible.  Hypocalcemia, mild: check ical, replace if necessary  HFrEF, nonischemic cardiomyopathy, not in exacerbation: EF 35 to 40%.  LHC 11/2021 with patent coronaries.  GDMT: Toprol, Entresto, and [place]. Also on furosemide 20mg  daily. Not acutely volume overloaded.  Not complaining manage shortness of breath.  Monitor I's/O's and daily weights.  Holding Toprol in setting of bradycardia.  Restarted Lasix today 3/10.  CKD III: Cr 1.3 on admission. Cr baseline 1.0-1.2. Likely with some renal hypoperfusion in setting of bradycardia and hypotension.  Held Lasix and Entresto initially while clinical status improved.  Lasix restarted today 3/10. Daily BMP.  Hypertension: On Toprol and Entresto.  Holding medications at this time.   Restart when appropriate.  Giving PRN hydralazine as necessary for uncontrolled HTN  Subclinical hypothyroidism: S/p total thyroidectomy.  Continue home levothyroxine.  TSH elevated, T4 WNL    INITIAL H AND P AND ICU COURSE:  73 year old female past medical history nonischemic cardiomyopathy, hypertension, hypothyroidism, paroxysmal atrial fibrillation presenting from outside hospital for symptomatic bradycardia.  Has been having fatigue and palpitations for approximately 1 week.  Found to have a significantly low heart rate with concern for complete heart block outside facility.  Was started on dobutamine and transferred to [facility] for further evaluation.  On arrival to [facility], patient was switched from dobutamine to dopamine.  Hemodynamically stable.  Answering questions appropriately.  Cardiology consulted for further evaluation.     3/10: No acute events overnight.  Patient off drips.  Still having some mild bradycardia but hemodynamically stable.  Stable for transfer to the floor.  Cardiology following, plan to consult EP for evaluation for pacemaker.    Past Medical History: Nonischemic cardiomyopathy, hypertension, hypothyroidism, paroxysmal atrial fibrillation.  Family History: N/A.  Social History: No tobacco use, no alcohol use, no drug use.    ROS   Complaining of some mild right knee pain.  Otherwise full review of systems negative.    Scheduled Meds:   furosemide  20 mg Oral Daily    sodium chloride flush  5-40 mL IntraVENous 2 times per day    levothyroxine  125 mcg Oral Daily    [Held by provider] empagliflozin  10 mg Oral Daily     Continuous Infusions:   sodium chloride 15 mL/hr at 04/01/23 2130       PHYSICAL EXAMINATION:  T:  98.2.  P: 48. RR: 15. B/P: 160/34. O2 Sat: 94% on 1LNC.  I/O: 3/8 = 295/250, 2/9 = none documented, 3/10 = 220/450  Body mass index is 22.35 kg/m.   GCS:   15  General:   Ill-appearing, elderly Caucasian female  HEENT:  normocephalic and atraumatic.  No scleral icterus.  PERR  Neck: supple.  No Thyromegaly.  Lungs: clear to auscultation.  No retractions  Cardiac: Cardia, regular rhythm.  No JVD.  Abdomen: soft.  Nontender.  Extremities:  No clubbing, cyanosis, or edema x 4.    Vasculature: capillary refill < 3 seconds. Palpable dorsalis pedis pulses.  Skin:  warm and dry.  Psych:  Alert and oriented x3.  Affect appropriate  Lymph:  No supraclavicular adenopathy.  Neurologic:  No focal deficit. No seizures.    Data: (All radiographs, tracings, PFTs, and imaging are personally viewed and interpreted unless otherwise noted).   BMP 3/10: Sodium 140, potassium 4.9, chloride 104, bicarb 29, BUN 38, creatinine 1.0  INR  CBC 3/9: WBC 7.8, hemoglobin 11.7, Mehta crit 39.6, MCV 99.5, platelets 196  CXR 3/8: cardiomegaly, no consolidations or effusions appreciated.  TSH/T4 3/8: 7.35/1.10  Telemetry shows sinus bradycardia      Seen with multidisciplinary ICU team.  Meets Continued ICU Level Care Criteria:    []  Yes   [x]  No - Transfer Planned to listed location: transfer order placed, waiting for bed assignment  []  HOSPITALIST CONTACTED- DR     Case and plan discussed with Dr. Richardson Dopp.    Electronically Signed by  Verlee Rossetti DO, MBA  PGY-2 Internal Medicine Resident  [facility]  On 04/03/2023 at 11:11 AM    This report has been created using voice recognition software. It may contain minor errors which are inherent in voice recognition technology.  Patient seen by me including key components of medical care.  Case discussed with resident physician.  No fever.  No angina.  No vomiting.  Ok to transition to medical service.  Italicized bold font, if present,  represents changes to the note made by me.  Time was discontiguous. Time does not include procedure. Time does include my direct assessment of the patient and coordination of care.  Time represents more than 50% of the time involved with patient care by the CC team.  Electronically signed by Kristine Garbe.  Richardson Dopp MD.
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
## 2023-04-04 ENCOUNTER — Inpatient Hospital Stay: Admit: 2023-04-05 | Payer: Medicare (Managed Care) | Primary: Family Medicine

## 2023-04-04 LAB — CALCIUM, IONIZED: Calcium, Ionized: 1.1 mmol/L — ABNORMAL LOW (ref 1.12–1.32)

## 2023-04-04 LAB — BASIC METABOLIC PANEL
BUN: 34 mg/dL — ABNORMAL HIGH (ref 8–23)
Calcium: 8.8 mg/dL (ref 8.8–10.2)
Chloride: 97 meq/L — ABNORMAL LOW (ref 98–111)
Creatinine: 0.8 mg/dL (ref 0.5–0.9)
Glucose: 100 mg/dL (ref 74–109)
Potassium: 4.2 meq/L (ref 3.5–5.2)

## 2023-04-04 LAB — APTT: aPTT: 28.2 s (ref 22.0–38.0)

## 2023-04-04 LAB — CBC
Hematocrit: 36.7 % — ABNORMAL LOW (ref 37.0–47.0)
Hemoglobin: 11.4 g/dL — ABNORMAL LOW (ref 12.0–16.0)
MCH: 29.9 pg (ref 26.0–33.0)
MCHC: 31.1 g/dL — ABNORMAL LOW (ref 32.2–35.5)
MCV: 96.3 fL (ref 81.0–99.0)
MPV: 10.8 fL (ref 9.4–12.4)
Platelets: 181 10*3/uL (ref 130–400)
RBC: 3.81 10*6/uL — ABNORMAL LOW (ref 4.20–5.40)
RDW-CV: 16 % — ABNORMAL HIGH (ref 11.5–14.5)
RDW-SD: 56.7 fL — ABNORMAL HIGH (ref 35.0–45.0)
WBC: 8.3 10*3/uL (ref 4.8–10.8)

## 2023-04-04 LAB — GLOMERULAR FILTRATION RATE, ESTIMATED: Est, Glom Filt Rate: 77 mL/min/{1.73_m2} (ref >60)

## 2023-04-04 LAB — ANION GAP: Anion Gap: 9 meq/L (ref 8.0–16.0)

## 2023-04-04 MED ORDER — LIDOCAINE HCL 2 % IJ SOLN
2 | INTRAMUSCULAR | Status: AC
Start: 2023-04-04 — End: ?

## 2023-04-04 MED ORDER — ACETAMINOPHEN 325 MG PO TABS
325 | ORAL | Status: DC | PRN
Start: 2023-04-04 — End: 2023-04-08
  Administered 2023-04-05 – 2023-04-07 (×5): 650 mg via ORAL

## 2023-04-04 MED ORDER — CEFAZOLIN SODIUM 1 G IJ SOLR
1 | Freq: Once | INTRAMUSCULAR | Status: DC
Start: 2023-04-04 — End: 2023-04-04

## 2023-04-04 MED ORDER — FENTANYL CITRATE (PF) 100 MCG/2ML IJ SOLN
100 | INTRAMUSCULAR | Status: AC
Start: 2023-04-04 — End: ?

## 2023-04-04 MED ORDER — STERILE WATER FOR INJECTION (MIXTURES ONLY)
2 | Status: AC
Start: 2023-04-04 — End: 2023-04-04
  Administered 2023-04-04: 18:00:00 2000 mg via INTRAVENOUS

## 2023-04-04 MED ORDER — BUMETANIDE 0.25 MG/ML IJ SOLN
0.25 | Freq: Once | INTRAMUSCULAR | Status: AC
Start: 2023-04-04 — End: 2023-04-04
  Administered 2023-04-04: 15:00:00 1 mg via INTRAVENOUS

## 2023-04-04 MED ORDER — AMLODIPINE BESYLATE 5 MG PO TABS
5 | Freq: Two times a day (BID) | ORAL | Status: DC
Start: 2023-04-04 — End: 2023-04-08
  Administered 2023-04-05 – 2023-04-08 (×8): 5 mg via ORAL

## 2023-04-04 MED ORDER — PROPOFOL 200 MG/20ML IV EMUL
200 | INTRAVENOUS | Status: AC
Start: 2023-04-04 — End: ?

## 2023-04-04 MED ORDER — NORMAL SALINE FLUSH 0.9 % IV SOLN
0.9 | INTRAVENOUS | Status: DC | PRN
Start: 2023-04-04 — End: 2023-04-06

## 2023-04-04 MED ORDER — PROPOFOL 200 MG/20ML IV EMUL
200 | Freq: Once | INTRAVENOUS | Status: DC | PRN
Start: 2023-04-04 — End: 2023-04-04
  Administered 2023-04-04: 18:00:00 10 via INTRAVENOUS
  Administered 2023-04-04: 18:00:00 30 via INTRAVENOUS

## 2023-04-04 MED ORDER — CEFAZOLIN SODIUM 1 G IJ SOLR
1 | Freq: Once | INTRAMUSCULAR | Status: DC
Start: 2023-04-04 — End: 2023-04-06

## 2023-04-04 MED ORDER — LIDOCAINE HCL 2 % IJ SOLN (MIXTURES ONLY)
2 | INTRAMUSCULAR | Status: DC | PRN
Start: 2023-04-04 — End: 2023-04-04
  Administered 2023-04-04: 18:00:00 20 via INTRADERMAL

## 2023-04-04 MED ORDER — NORMAL SALINE FLUSH 0.9 % IV SOLN
0.9 | Freq: Two times a day (BID) | INTRAVENOUS | Status: DC
Start: 2023-04-04 — End: 2023-04-06
  Administered 2023-04-04 – 2023-04-05 (×3): 10 mL via INTRAVENOUS

## 2023-04-04 MED ORDER — SODIUM CHLORIDE 0.9 % IV SOLN
0.9 | INTRAVENOUS | Status: DC | PRN
Start: 2023-04-04 — End: 2023-04-06

## 2023-04-04 MED ORDER — FENTANYL CITRATE (PF) 100 MCG/2ML IJ SOLN
100 | Freq: Once | INTRAMUSCULAR | Status: DC | PRN
Start: 2023-04-04 — End: 2023-04-04
  Administered 2023-04-04 (×2): 50 via INTRAVENOUS

## 2023-04-04 MED ORDER — METOPROLOL SUCCINATE ER 25 MG PO TB24
25 | Freq: Every day | ORAL | Status: DC
Start: 2023-04-04 — End: 2023-04-08
  Administered 2023-04-05 – 2023-04-08 (×4): 25 mg via ORAL

## 2023-04-04 MED ORDER — AMIODARONE HCL 200 MG PO TABS
200 | Freq: Every day | ORAL | Status: DC
Start: 2023-04-04 — End: 2023-04-08
  Administered 2023-04-05 – 2023-04-08 (×4): 200 mg via ORAL

## 2023-04-04 MED ORDER — IOPAMIDOL 76 % IV SOLN
76 | INTRAVENOUS | Status: DC | PRN
Start: 2023-04-04 — End: 2023-04-04
  Administered 2023-04-04: 19:00:00 10 via INTRACARDIAC

## 2023-04-04 MED FILL — XYLOCAINE 2 % IJ SOLN: 2 % | INTRAMUSCULAR | Qty: 20 | Fill #0

## 2023-04-04 MED FILL — FENTANYL CITRATE (PF) 100 MCG/2ML IJ SOLN: 100 MCG/2ML | INTRAMUSCULAR | Qty: 2 | Fill #0

## 2023-04-04 MED FILL — LIDOCARE ARM/NECK/LEG 4 % EX PTCH: 4 % | CUTANEOUS | Qty: 1 | Fill #0

## 2023-04-04 MED FILL — BUMETANIDE 0.25 MG/ML IJ SOLN: 0.25 MG/ML | INTRAMUSCULAR | Qty: 4 | Fill #0

## 2023-04-04 MED FILL — AMLODIPINE BESYLATE 5 MG PO TABS: 5 MG | ORAL | Qty: 1 | Fill #0

## 2023-04-04 MED FILL — FUROSEMIDE 20 MG PO TABS: 20 MG | ORAL | Qty: 1 | Fill #0

## 2023-04-04 MED FILL — DIPRIVAN 200 MG/20ML IV EMUL: 200 MG/20ML | INTRAVENOUS | Qty: 20 | Fill #0

## 2023-04-04 MED FILL — ACETAMINOPHEN 325 MG PO TABS: 325 MG | ORAL | Qty: 2 | Fill #0

## 2023-04-04 MED FILL — CEFAZOLIN SODIUM 2 G IV SOLR: 2 g | INTRAVENOUS | Qty: 2000 | Fill #0

## 2023-04-04 MED FILL — SODIUM CHLORIDE 0.9 % IR SOLN: 0.9 % | Qty: 250 | Fill #0

## 2023-04-04 MED FILL — SYNTHROID 125 MCG PO TABS: 125 MCG | ORAL | Qty: 1 | Fill #0

## 2023-04-04 NOTE — Progress Notes (Signed)
 Hospitalist Progress Note  Internal Medicine Resident      Patient: Michelle Bright 73 y.o. female      Unit/Bed: 4K-04/004-A    Admit Date: 03/31/2023      ASSESSMENT AND PLAN  Active Problems  Symptomatic bradycardia: 1 week history of fatigue and palpitations. Reported as CHB at outside facility, placed on dobumatine and transferred to [facility]. On amiodarone/Toprol for pAF. EKG here showing sinus bradycardia with LBBB. Switched to epi gtt; off inotropic drips now. Cardiology consulted; trial diuretic in setting of SOB.  EP consulted; failure pharmacological rate control bradycardia; planning for pacemaker placement.   Pacemaker placement plan for 3/11  Keep patient n.p.o.  Currently holding Toprol 25 Mg p.o. daily and amiodarone 200 Mg p.o. daily; per cardiology will restart home meds after PPM placement   Monitor telemetry  Trend vitals  Paroxysmal atrial fibrillation: CHA2DS2-VASc 6. Rhythm controlled with amiodarone. Rate controlled with Toprol. Anticoagulated with warfarin. Has follow-up with Dr. Corrinne Eagle, electrophysiologist in the past. INR 1.61.  Per cardiology; recommend holding home meds Toprol 25 Mg p.o. daily and amiodarone 200 Mg p.o. daily in setting of PPM placement; will restart after placement  Per cardiology; no heparin or DOAC for now due to planned PPM placement  Prolonged QTc: Likely from chronic left bundle branch block. Continue telemetry. Avoiding QTc prolonging agents as possible.   Hypocalcemia, mild: check ical, replace if necessary   HFrEF, nonischemic cardiomyopathy, not in exacerbation: EF 35 to 40%.  LHC 11/2021 with patent coronaries.  GDMT: Toprol, Entresto, and [place]. Also on furosemide 20mg  daily. Not acutely volume overloaded. Not complaining manage shortness of breath. Monitor I's/O's and daily weights. Cardiology consulted; recommended holding home medications in setting of PPM placement.  CKD III: Cr 1.3 on admission. Cr baseline 1.0-1.2. Likely with some renal hypoperfusion in  setting of bradycardia and hypotension. Held Lasix and Entresto initially while clinical status improved. Lasix restarted today 3/10. Daily BMP.   Hypertension: /310 she was hypertensive; SBP in the 190s. Per cardiology had hydralazine as needed and amlodipine. Toprol and Entresto. Holding Toprol at this time. Restart when appropriate.  Giving PRN hydralazine as necessary for uncontrolled HTN   Subclinical hypothyroidism: S/p total thyroidectomy. Continue home levothyroxine. TSH elevated, T4 WNL       LDA: [] CVC / [] PICC / [] Midline / [] Foley / [] Drains / [] Mediport / [x] None  Antibiotics: Ancef  Steroids: N/A  Labs (still needed?): [] Yes / [x] No  IVF (still needed?): [] Yes / [x] No    Level of care: [x] Step Down / [] Med-Surg  Bed Status: [x] Inpatient / [] Observation  Telemetry: [x] Yes / [] No  PT/OT: [x] Yes / [] No    DVT Prophylaxis: []  Lovenox / []  Heparin / []  SCDs / []  Already on Systemic Anticoagulation / [x]  None     Expected discharge date:  N/A  Disposition: PPM placement planned for 3/11 at 8 PM  Code status: Full Code     ===================================================================    Chief Complaint: Fatigue, palpitations   Subjective (past 24 hours): Patient seen and evaluated at bedside. A&Ox4. Pulse 56, blood pressure 168/72; saturating at 95% on room air. Per nursing no acute events overnight, patient had a junctional rhythm, heart rate went down to 30s but came back up.  Patient reports doing well. Reports no chest pain or SOB.  Reports having a little headache and feeling like her heart beats fast sometimes.  Reports sleeping well overnight. Patient currently n.p.o. Reports normal urination and bowel movements. Denies fever, nausea/vomiting, chest pain,  SOB, constipation/diarrhea, abnormal urinary symptoms, headache, dizziness, fatigue, numbness/tingling.    HPI / Hospital Course:  73 year old female past medical history nonischemic cardiomyopathy, hypertension, hypothyroidism, paroxysmal  atrial fibrillation presenting from outside hospital for symptomatic bradycardia.  Has been having fatigue and palpitations for approximately 1 week.  Found to have a significantly low heart rate with concern for complete heart block outside facility.  Was started on dobutamine and transferred to [facility] for further evaluation.  On arrival to [facility], patient was switched from dobutamine to dopamine.  Hemodynamically stable.  Answering questions appropriately.  Cardiology consulted for further evaluation.     Medications:    Infusion Medications    sodium chloride 15 mL/hr at 04/01/23 0712    Scheduled Medications    amLODIPine  5 mg Oral BID    [START ON 04/05/2023] amiodarone  200 mg Oral Daily    [START ON 04/05/2023] metoprolol succinate  25 mg Oral Daily    ceFAZolin  2,000 mg IntraVENous On Call to OR    ceFAZolin (ANCEF) 1,000 mg in sod chloride IRR soln 0.9 % 250 mL irrigation   Irrigation Once    lidocaine  1 patch TransDERmal Daily    furosemide  20 mg Oral Daily    sodium chloride flush  5-40 mL IntraVENous 2 times per day    levothyroxine  125 mcg Oral Daily    [Held by provider] empagliflozin  10 mg Oral Daily    PRN Meds: hydrALAZINE, albuterol, oxyCODONE-acetaminophen, sodium chloride flush, sodium chloride, potassium chloride **OR** potassium chloride, magnesium sulfate, polyethylene glycol, acetaminophen **OR** acetaminophen, calcium gluconate, prochlorperazine    Exam:  BP (!) 168/72   Pulse 56   Temp 98.1 F (36.7 C) (Oral)   Resp 16   Ht 1.499 m (4' 11.02")   Wt 50.2 kg (110 lb 10.7 oz)   SpO2 95%   BMI 22.34 kg/m   General: No distress, appears stated age.  Eyes:  PERRL. Conjunctivae/corneas clear.  HENT: Head normal appearing. Nares normal. Oral mucosa moist.  Hearing intact.   Neck: Supple, with full range of motion. Trachea midline.  No gross JVD appreciated.  Respiratory:  Normal effort. Clear to auscultation, without rales or wheezes or rhonchi.  Cardiovascular: Normal rate, regular rhythm  with normal S1/S2 without murmurs.    No lower extremity edema.   Abdomen: Soft, non-tender, non-distended with normal bowel sounds.  Musculoskeletal: No joint swelling or tenderness. Normal tone. No abnormal movements.   Skin: Warm and dry. No rashes or lesions.  Neurologic:  No focal sensory/motor deficits in the upper or lower extremities. Cranial nerves:  grossly non-focal 2-12.     Psychiatric: Alert and oriented, normal insight and thought content.   Capillary Refill: Brisk,< 3 seconds.  Peripheral Pulses: +2 palpable, equal bilaterally.       Labs/Radiology: See chart or assessment above.     Electronically signed by Maryjo Rochester, DO on 04/04/2023 at 12:08 PM  Case was discussed with Attending, Dr. Zola Button.

## 2023-04-04 NOTE — Care Coordination-Inpatient (Signed)
 04/04/23, 10:31 AM EDT  DISCHARGE PLANNING EVALUATION    SW spoke with Katie at [facility] and she reported that they discharged patient and they are not current. SW made new referral. Will need new HH orders.

## 2023-04-04 NOTE — Progress Notes (Signed)
 [facility]  [facility] ICU STEPDOWN TELEMETRY 4K  Occupational Therapy  Daily Note    Discharge Recommendations: Not safe to return home at this time, Patient would benefit from continued OT at discharge, and Inpatient Therapy Stay  Equipment Recommendations:   will continue to assess pending progress, discharge location      Time In: 1014  Time Out: 1039  Timed Code Treatment Minutes: 25 Minutes  Minutes: 25          Date: 04/04/2023  Patient Name: Michelle Bright,   Gender: female      Room: 4K-04/004-A  MRN: [id number]  DOB: [birth year]  (73 y.o.)  Referring Practitioner: William Hamburger, Hoshimjon J, DO  Diagnosis: Bradycardia  Additional Pertinent Hx: Per H&P: 73 year old female past medical history nonischemic cardiomyopathy, hypertension, hypothyroidism, paroxysmal atrial fibrillation presenting from outside hospital for symptomatic bradycardia.  Has been having fatigue and palpitations for approximately 1 week.  Found to have a significantly low heart rate with concern for complete heart block outside facility.  Was started on dobutamine and transferred to [name] for further evaluation.  On arrival to [facility], patient was switched from dobutamine to dopamine.    Restrictions/Precautions:  Restrictions/Precautions: Fall Risk     Social/Functional History:  Lives With: Spouse, Daughter  Type of Home: House  Home Layout: One level  Home Access: Stairs to enter without rails  Entrance Stairs - Number of Steps: 3  Home Equipment: Designer, industrial/product, Wheelchair - Manual   Bathroom Shower/Tub: Medical sales representative: Handicap height  Bathroom Equipment: Grab bars in Neurosurgeon Help From: Home health [facility] RN prior)  Prior Level of Assist for ADLs: Needs assistance (pt reported normally tries to complete all ADLs on own, although frequently requires assist with toileting and showering tasks)  Prior Level of Assist for Homemaking: Needs assistance  Prior Level of Assist for Transfers: Needs assistance  (occasional assist)  Prior Level of Assist for Ambulation: Independent household ambulator, with or without device (always has assist/supervision with walking)    Active Driver: No  Patient's Driver Info: Husband and daughter     Additional Comments: Question accuracy of pt's reported PLOF. Pt reported primarily uses RW for all mobility, although later in session reported uses w/c primarily. Pt reported husband or daughter are home all the time with her    SUBJECTIVE: RN ok'ed session. Pt lying supine with HOB elevated upon arrival. Pt pleasant and agreeable to OT session.     PAIN: c/o L bottom pain from laying in bed    Vitals: Vitals not assessed per clinical judgement, see nursing flowsheet    COGNITION: Slow Processing and Decreased Problem Solving    ADL:   Bathing: Moderate Assistance.  Sponge bath completed on EOB and Pt able to wash UB and tops of legs. Pt requires A to wash back and below knee. Pt tolerated > 10 min sitting with SBA and mod vc for midline. Pt demo'ed R lateral lean with fatigue   Upper Extremity Dressing: Minimal Assistance.  To doff/don  gown ties  Footwear Management: Dependent.  To doff/don B socks  Toileting: Dependent.  Pt aware when she has to urinate. Pt utilizes external catheter while lying in bed .    IADL:   Not Tested    BED MOBILITY:  Rolling to Left: Moderate Assistance, X 1, with head of bed flat, with rail, with increased time for completion    Rolling to Right: Moderate Assistance, X  1, with head of bed raised, with rail, with increased time for completion    Supine to Sit: Maximum Assistance, X 1, with head of bed raised, with rail, with increased time for completion    Sit to Supine: Moderate Assistance, X 1, with head of bed flat, with rail, with increased time for completion      TRANSFERS:  Transfers Not Tested at this time secondary to: Pt refuses to attempt this am.     Functional Outcome Measures:   AM-PAC Inpatient Daily Activity Raw Score: 12     Modified  Rankin:  Current Functional Status:  Not Applicable    ASSESSMENT:     Activity Tolerance:  Patient tolerance of  treatment: Good treatment tolerance      Plan: Times Per Week: 5x  Current Treatment Recommendations: Strengthening, ROM, Balance training, Functional mobility training, Endurance training, Safety education & training, Patient/Caregiver education & training, Equipment evaluation, education, Sports administrator, Self-Care / ADL    Education:  Learners: Patient  Role of OT, Plan of Care, ADL's, Reviewed Prior Education, Home Safety, and Importance of Increasing Activity    Goals  Short Term Goals  Time Frame for Short Term Goals: by discharge  Short Term Goal 1: Pt will progress with completion of transfers/functional mobilityas able.  Short Term Goal 2: Pt will complete BADL tasks with minimal assistance to increase independence with self care tasks.  Short Term Goal 3: Pt will tolerate dynamic sitting at EOB X10 minutes with consistent SBA in prep for ADL completion.  Long Term Goals  Time Frame for Long Term Goals : not set due to ELOS    Following session, patient left in safe position with all fall risk precautions in place.

## 2023-04-04 NOTE — Progress Notes (Signed)
 Cardiology Progress Note      Patient:  Michelle Bright  Date of Birth: 1950-08-17  MRN: [id number]   Acct: [id number]  Admit Date:  03/31/2023  Primary Cardiologist: Geradine Girt   Seen by dr. Gabrielle Dare    Per prior cardiology consult note-  REASON FOR CONSULTATION:  Symptomatic bradycardia.     HISTORY OF PRESENT ILLNESS:  This is a pleasant 73 year old lady, who apparently follows with Dr. Geradine Girt, who has a history of known atrial fibrillation, who came in with symptoms of fatigue, tiredness, palpitation, and no obvious syncope.  Was found to have significant bradycardia.  Was admitted for a diagnosis of symptomatic bradycardia.  She initially presented to outlying hospital and was subsequently transferred to [facility].  The patient has been maintained on amiodarone and metoprolol as an outpatient which has been on hold.  Denies any active chest pain.  Does have some baseline shortness of breath.      Subjective (Events in last 24 hours):   Pt in bed   Tele SR hr 60's     BP elevated   Pt co headache at present     Per family pt taking toprol 12.5  amio 200 day    Pt states no chest pains recently -- she does correspond dizziness recently     Per husband pt has been having spells of staring and not responding  Lately       Pt agreeable to [facility]       04/04/23  Pt in bed     Tele review - SB overnight with intermittent junctional hr 38 lowest - mostly 40   With awake up to 59   BP stable   Pt appears to be SOB with rest - she denies this   She denies chest pains     Discussed with Dr. Don Broach - PPM - she will be added on today     Discussed with pt - she understands ppm / restrictions and why she needs PPM       Objective:   BP (!) 168/71   Pulse 55   Temp 97.6 F (36.4 C) (Oral)   Resp 16   Ht 1.499 m (4' 11.02")   Wt 50.2 kg (110 lb 10.7 oz)   SpO2 93%   BMI 22.34 kg/m        TELEMETRY: SR HR 59 - with sleep HR 40 - intermittent junctional with lowest HR overnight 38    Physical Exam:  General Appearance:  alert and oriented to person, place- , in no acute distress  Cardiovascular: normal rate, regular rhythm, normal S1 and S2, no murmurs, rubs, clicks, or gallops, distal pulses intact  Pulmonary/Chest: diminished to auscultation bilaterally- no wheezes, rales or rhonchi, normal air movement, no respiratory distress  Abdomen: soft, non-tender, non-distended, normal bowel sounds, no masses Extremities: no cyanosis, clubbing or edema, pulses present    Musculoskeletal: normal range of motion, no joint swelling, deformity or tenderness  Neurological: alert, oriented to self and family and place- , normal speech, no focal findings or movement disorder noted    Medications:    amLODIPine  5 mg Oral Daily    lidocaine  1 patch TransDERmal Daily    furosemide  20 mg Oral Daily    sodium chloride flush  5-40 mL IntraVENous 2 times per day    levothyroxine  125 mcg Oral Daily    [Held by provider] empagliflozin  10 mg Oral Daily  sodium chloride 15 mL/hr at 04/01/23 5643     hydrALAZINE, 10 mg, Q6H PRN  albuterol, 2.5 mg, Q6H PRN  oxyCODONE-acetaminophen, 1 tablet, Q6H PRN  sodium chloride flush, 5-40 mL, PRN  sodium chloride, , PRN  potassium chloride, 20 mEq, PRN   Or  potassium chloride, 10 mEq, PRN  magnesium sulfate, 2,000 mg, PRN  polyethylene glycol, 17 g, Daily PRN  acetaminophen, 650 mg, Q6H PRN   Or  acetaminophen, 650 mg, Q6H PRN  calcium gluconate, 2,000 mg, PRN  prochlorperazine, 10 mg, Q6H PRN        Diagnostics:    Echo: 04/01/23  Left Ventricle Normal left ventricular systolic function with a visually estimated EF of 55 - 60%. Left ventricle size is normal. Normal wall thickness. Normal wall motion. Abnormal diastolic function.   Left Atrium Left atrium is moderately dilated.   Right Ventricle Right ventricle size is normal. Normal systolic function.   Right Atrium Right atrium size is normal.   Aortic Valve Mildly thickened cusps. Moderately calcified cusps. Mild regurgitation. Mild to moderate stenosis of  the aortic valve.   Mitral Valve Mildly calcified leaflets. Mild regurgitation. No stenosis noted.   Tricuspid Valve Valve structure is normal. Mild regurgitation. No stenosis noted.   Pulmonic Valve The pulmonic valve visualization is suboptimal but appears to be functioning normally. Physiologically normal regurgitation. No stenosis noted.   Aorta Normal sized aortic root and ascending aorta.   IVC/Hepatic Veins IVC diameter is less than or equal to 21 mm and decreases greater than 50% during inspiration; therefore the estimated right atrial pressure is normal (~3 mmHg). IVC size is normal.   Pericardium No pericardial effusion.           Left Heart Cath: 12/22/21  Patent coronaries, abnormal LV function   EF 35-40%  No complications     Diagnostic  Dominance: Right  No diagnostic findings have been documented.  Intervention    No interventions have been documented.  Left Heart    Left Ventricle There is moderate left ventricular systolic dysfunction. The estimated EF = 35 - 40%.     Coronary Diagram    Diagnostic  Dominance: Right    Lab Data:    Cardiac Enzymes:  No results for input(s): "CKTOTAL", "CKMB", "CKMBINDEX", "TROPONINI" in the last 72 hours.    CBC:   Lab Results   Component Value Date/Time    WBC 8.3 04/04/2023 05:53 AM    RBC 3.81 04/04/2023 05:53 AM    RBC 17 01/21/2020 09:08 AM    HGB 11.4 04/04/2023 05:53 AM    HCT 36.7 04/04/2023 05:53 AM    PLT 181 04/04/2023 05:53 AM       CMP:    Lab Results   Component Value Date/Time    NA 136 04/04/2023 05:53 AM    K 4.2 04/04/2023 05:53 AM    K 5.2 12/24/2022 03:27 AM    CL 97 04/04/2023 05:53 AM    CO2 30 04/04/2023 05:53 AM    BUN 34 04/04/2023 05:53 AM    CREATININE 0.8 04/04/2023 05:53 AM    LABGLOM 77 04/04/2023 05:53 AM    LABGLOM 86 04/13/2022 12:16 PM    GLUCOSE 100 04/04/2023 05:53 AM    CALCIUM 8.8 04/04/2023 05:53 AM       Hepatic Function Panel:    Lab Results   Component Value Date/Time    ALKPHOS 125 04/01/2023 08:17 AM    ALT 15 04/01/2023  08:17  AM    AST 33 04/01/2023 08:17 AM    BILITOT 0.3 04/01/2023 08:17 AM    BILITOT Negative 01/21/2020 09:08 AM    BILIDIR 0.2 04/01/2023 08:17 AM       Magnesium:    Lab Results   Component Value Date/Time    MG 2.1 04/02/2023 08:05 AM       PT/INR:    Lab Results   Component Value Date/Time    INR 1.61 04/03/2023 04:01 PM       HgBA1c:    Lab Results   Component Value Date/Time    LABA1C 5.6 12/28/2022 06:02 AM       FLP:    Lab Results   Component Value Date/Time    TRIG 107 04/11/2015 05:15 AM    HDL 64 04/11/2015 05:15 AM       TSH:    Lab Results   Component Value Date/Time    TSH 14.70 04/01/2023 03:27 AM         Assessment:    Pt staring off while walking - why she came to hospital   Hx CVA  ? CVA workup - no new deficits per family   Or related to bradycardia - EKG noted junctional with HR 38 at OSH       Fluid overload from bradycardia   - trial diuretic      PAFB  / AFL -- at present SR HR 60's  - failed flecainide in past   was on amiodarone started per [facility] 09/2022 for recurrent AFB - remains on amio 200 po day / toprol 12.5 day  CHADS2VASC 6 on OAC ? 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 Tachy induced CDMP  Normal cath 11/2021      Ckd stage 3   Hx thyroidectomy       Plan:  Plan for PPM today   Will restart home meds after ppm placed   Trial diuretic - pt appears sob   bumex 1 mg iv x1  Follow          Electronically signed by Michae Kava, APRN - CNP on 04/04/2023 at 9:02 AM

## 2023-04-04 NOTE — Anesthesia Post-Procedure Evaluation (Signed)
 Department of Anesthesiology  Postprocedure Note    Patient: Michelle Bright  MRN: [id number]  Birthdate: [birth year]  Date of evaluation: 04/04/2023    Procedure Summary       Date: 04/04/23 Room / Location: [facility] CATH LAB 4 / [facility] CARDIAC CATH LAB    Anesthesia Start: 1342 Anesthesia Stop: 1506    Procedure: Insert PPM dual Diagnosis:       Abnormal ECG      (Abnormal ECG [R94.31])    Providers: Andres Labrum, MD Responsible Provider: Eloy End, DO    Anesthesia Type: MAC ASA Status: 3            Anesthesia Type: No value filed.    Aldrete Phase I: Aldrete Score: 10    Aldrete Phase II:      Anesthesia Post Evaluation    Patient location during evaluation: bedside  Patient participation: complete - patient participated  Level of consciousness: awake and alert  Airway patency: patent  Nausea & Vomiting: no nausea and no vomiting  Cardiovascular status: hemodynamically stable and blood pressure returned to baseline  Respiratory status: acceptable, spontaneous ventilation, nonlabored ventilation and room air  Hydration status: stable  Pain management: adequate        No notable events documented.

## 2023-04-04 NOTE — Consults (Signed)
 [facility]                 [address] [place], [place] [address]                          CARDIAC ELECTROPHYSIOLOGY    CONSULTATION      PATIENT NAME: Michelle Michelle Bright, Michelle Michelle Bright              DOB: Mar 09, 1950  MED REC NO: [id number]                       ROOM: 4D-09  ACCOUNT NO: [id number]                       ADMIT DATE: 03/31/2023       CONSULT DATE: 04/01/2023    REFERRING PHYSICIAN:  ICU Team      Assessment and Plan:  Sx bradycardia with VR in the 40s.with junctional rhythm and SB. She was on amiodarone and metoprolol for AF management and cardiomyopathy. 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 Given failure of pharmacologic rate control due to bradycardia, we discussed role of pacemaker. Reviewed ratioanale, risks, benefits, alternatives. Will plan for it this admission.     2. 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 Had prior ablation for flutter. Has been on amiodarone per Dr Geradine Girt. Currently in SR. Continue OAC.    3. LBBB    REASON FOR CONSULTATION:  Symptomatic bradycardia.    HISTORY OF PRESENT ILLNESS:  This is Michelle Bright pleasant 73 year old lady, who apparently follows with Dr. Geradine Girt, who has Michelle Bright history of known atrial fibrillation, who came in with symptoms of fatigue, tiredness, palpitation, and no obvious syncope.  Was found to have significant bradycardia.  Was admitted for Michelle Bright diagnosis of symptomatic bradycardia.  She initially presented to outlying hospital and was subsequently transferred to [facility].  The patient has been maintained on amiodarone and metoprolol as an outpatient which has been on hold.  Denies any active chest pain.  Does have some baseline shortness of breath.    REVIEW OF SYSTEMS:  1. No obvious fever or chills.  2. No hematuria or dysuria.  3. No abdominal pain, nausea, vomiting, or diarrhea.  4. No obvious active psych problems or suicidal ideation.  5. No skin rashes.  6. No obvious  dizziness, lightheadedness, or loss of consciousness.  7. No recent trauma.  8. No bleeding problem.  9. No HEENT related problem.    PAST MEDICAL HISTORY:    1. Cardiomyopathy.  2. Atrial fibrillation.  3. No known history of coronary artery disease.  The patient had Michelle Bright cardiac cath in November 2023, which showed no significant disease.    ALLERGIES:  XARELTO.      CURRENT MEDICATIONS:  Jardiance 10 Michelle Bright day, Lasix 20 Michelle Bright day, levothyroxine 125 Michelle Bright day, metoprolol 25 Michelle Bright day.    SOCIAL HISTORY:  History of smoking.  No alcohol or drug abuse.    FAMILY HISTORY:  Significant for coronary artery disease.    PHYSICAL EXAMINATION:  VITAL SIGNS:  Blood pressure of 130/70, heart rate of 50.   GENERAL APPEARANCE:  Pleasant lady, in no obvious acute distress.  EYES AND EARS:  No discharge.  NECK:  No JVD.  No bruits.  No masses.  LUNGS:  Decreased air entry.  No crackles.  No wheezes.  HEART:  Normal  S1, S2.  Systolic murmur grade 2/6.   ABDOMEN:  Soft, nontender.  Positive bowel sounds.  No organomegaly.  EXTREMITIES:  No significant edema.  NEUROLOGICAL:  Grossly intact.  Awake, alert.  No focal deficits.  PSYCH:  No evidence of active psychosis.  SKIN:  No rashes.    LABORATORY DATA:  Showed sodium 140, potassium 4.8, BUN 37, creatinine 1.1.  White count 12, hemoglobin 11.6, hematocrit 39.8, platelets 237.  EKG showed sinus bradycardia with nonspecific changes.

## 2023-04-04 NOTE — Op Note (Signed)
 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 Dual chamber PPM implantation with left bundle pacing.     ATTENDING OPERATOR: [place] Surges K. Don Broach, MD    ESTIMATED BLOOD LOSS: 10 mL.     COMPLICATIONS:   None    ANESTHESIA:   Intravenous sedation and local anesthetic    INDICATION FOR PROCEDURE:  Briefly, the patient is a 73 y.o. year old female with a history of atrial fibrillation on antiarrhythmic drug therapy who presents with symptomatic bradycardia.     PROCEDURE AND FINDINGS:   The patient was brought to the electrophysiology laboratory in a fasting state. Written informed consent was obtained. Intravenous prophylactic antibiotics were administered prior to the procedure.      After the patient and site of implantation were prepped and draped in the usual sterile fashion and after adequate anesthesia was given, the skin was infiltrated with mixture of 2% lidocaine and 0.25%. The skin was incised with a #10 scalpel. Blunt and electrosurgical dissection was carried out to the level of the prepectoral fascia with careful attention paid to hemostasis. A pocket to house the pulse generator was formed between the prepectoral fascia and subcutaneous fat with a combination of blunt and electrosurgical dissection. Once adequate hemostasis was confirmed within the pocket, venous access was obtained.      RIGHT VENTRICULAR LEFT BUNDLE BRANCH PACING LEAD:  Using the modified Seldinger technique with a 5 [place] micropuncture needle, a 7 French peel away sheath was placed into the venous system via over the wire technique. A 315 Medtronic his sheath advanced over a Terumo glidewire to the tricuspid valve. Wire and dilator were removed and sheath was flushed.  A Medtronic 3830 lead was advanced to the tip of  the sheath. Unipolar mapping was performed. A His bundle signal was identified and recorded.  The sheath was advanced about 2cm towards the RV apex.  Pacing performed and we identified a location with a W configuration in lead V1 and discordant aVL (positive) and aVR with R in lead II > lead III.  Gentle counterclockwise torque was applied to the sheath and the lead was then turned clockwise 5-6 rotations.   In LAO fluoroscopy the lead advanced deep into the septum.  Paced morphology demonstrated a RBBB morphology in lead. The paced QRSd was . The peak LV activation time as recorded rom stim to peak of the R wave in V5/V6 was 72.     RIGHT ATRIAL LEAD:  Using the modified Seldinger technique with a 5 [place] micropuncture needle, a 7 French peel away sheath was placed into the venous system via over the wire technique.The right atrial lead was placed via this sheath into the right atrial appendage  location. Adequate sensing, pacing impedance and pacing threshold parameters were obtained. The lead was attached via active fixation. There was no evidence of diaphragmatic stimulation at 10 V output. The peel away sheath was removed and the lead collar was advanced to the pectoral muscle and sutured with a pair of 0-ethibond sutures and the suture sleeve. Tug testing of this lead confirmed stability of the lead and the length of the lead's slack was assessed as optimal with fluoroscopy.     The pulse generator was then brought to  the field. The lead tips for all leads were cleaned and dried thoroughly. The leads were attached to the appropriate ports on the pulse generator. Tug testing was performed on all connections. The pocket to house the pulse generator was copiously irrigated with antibiotic containing normal saline and subsequently observed. The device and leads were placed within the pocket such that the coiled redundant leads were posterior to the pulse generator.     The pocket was closed with three  continuous layers of 2,0 3-0 and 4-0 vicryl sutures. Steri strips were applied to the wound. The wound was covered with a sterile dressing.    DEVICE DATA:   See Epic    SUMMARY:    Successful implantation of Medtronic dual chamber permanent pacemaker with left bundle pacing lead.      RECOMMENDATION:    1. Chest x-ray, electrocardiogram prior to discharge   2. Wound check in 7-10 days, device check in 3 months.  3. Patient to be instructed not to touch the wound or get wound wet for the period of one week. Patient instructed not to flex, extend, or abduct the shoulder joint ipsilateral to implantation more than 90 degrees for a period of 6 weeks. Movement of the shoulder below this threshold is encouraged. Patient also instructed not to drive x 1 week.  4. For any questions or concerns, please call the  Cardiac Electrophysiology service.     Wound care instructions for patient:  Keep the dressing intact and dry for 72 hours, you can shower but cover the gauze to prevent it from getting wet and falling off prematurely.  After 72 hours you can remove the clear dressing and the gauze underneath.  There are tape strips under the gauze, these should be left alone until they fall off in approximately 4 weeks in order to minimize scar formation.  After the dressing is removed at 72 hours, you can take a shower and allow the water to run over the wound, but do not rub or manipulate the wound.  You should not immerse the wound in a bath tub or pool or beach for 6 weeks until it is completely healed.  If the wound develops painful swelling, bleeding, or arm pain/swelling, come into the emergency room or see your cardiologist for evaluation.  Some bruising is normal but should not be swollen or with worsening pain.    Anell Barr, Attending Physician, was present for the duration of the procedure and performed all aspects of this procedure as the primary operator.

## 2023-04-04 NOTE — Progress Notes (Signed)
 [facility]  INPATIENT PHYSICAL THERAPY  EVALUATION  [facility] ICU STEPDOWN TELEMETRY 4K - 4K-04/004-A    Discharge Recommendations: Continue to assess pending progress, Subacute/Skilled Nursing Facility, 24 hour supervision or assist, Patient would benefit from continued therapy after discharge  Equipment Recommendations:    cont to assess needs            Time In: 1125  Time Out: 1140  Timed Code Treatment Minutes: 8 Minutes  Minutes: 15          Date: 04/04/2023  Patient Name: Michelle Bright,  Gender:  female        MRN: [id number]  DOB: Jun 15, 1950  (73 y.o.)      Referring Practitioner: Dr. Shawnie Dapper  Diagnosis: Bradycardia  Additional Pertinent Hx: Per H&P: 73 year old female past medical history nonischemic cardiomyopathy, hypertension, hypothyroidism, paroxysmal atrial fibrillation presenting from outside hospital for symptomatic bradycardia.  Has been having fatigue and palpitations for approximately 1 week.  Found to have a significantly low heart rate with concern for complete heart block outside facility.  Was started on dobutamine and transferred to [facility] for further evaluation.  On arrival to [facility], patient was switched from dobutamine to dopamine.     Restrictions/Precautions:  Restrictions/Precautions: Fall Risk            Required Braces or Orthoses?: No      Subjective:  Chart Reviewed: Yes  Patient assessed for rehabilitation services?: Yes  Subjective: cooperative to sit EOB, with encouragement agreeable to trial transfer. RN present to help adjust bedding and administer meds during session.    General:     Vision: Impaired  Vision Exceptions: Wears glasses at all times  Hearing: Within functional limits       Pain: no c/o pain at rest, c/o chronic LUE pain with mobility    Vitals: Vitals not assessed per clinical judgement, see nursing flowsheet    Social/Functional History:    Lives With: Spouse, Daughter  Type of Home: House  Home Layout: One level  Home Access: Stairs to enter without  rails  Entrance Stairs - Number of Steps: 3  Home Equipment: Designer, industrial/product, Wheelchair - Manual     Bathroom Shower/Tub: Medical sales representative: Handicap height  Bathroom Equipment: Grab bars in Neurosurgeon Help From: Home health [facility] RN prior)  Prior Level of Assist for ADLs: Needs assistance (pt reported normally tries to complete all ADLs on own, although frequently requires assist with toileting and showering tasks)  Prior Level of Assist for Homemaking: Needs assistance  Prior Level of Assist for Transfers: Needs assistance (occasional assist)  Prior Level of Assist for Ambulation: Independent household ambulator, with or without device (always has assist/supervision with walking)    Active Driver: No     Additional Comments: Question accuracy of pt's reported PLOF. Pt reported primarily uses RW for all mobility, although later in session reported uses w/c primarily. Pt reported husband or daughter are home all the time with her    OBJECTIVE:  Range of Motion:  Bilateral Lower Extremity: [facility] except bilateral ankle DF to neutral with stretching.    Strength:  Bilateral Lower Extremity: Impaired - >/=3-/5, generalized weakness    Balance:  Static Sitting Balance:  Moderate Assistance, initially with lean to right and post but progressed to CGA to SBA primarily with 1-2 UE support at EOB  Static Standing Balance: Maximum Assistance, to modAx1 with BUE HHA, post lean, fwd head posture,  NBOS, pivot to [facility] with maxAx1.    Bed Mobility:  Rolling to Left: Maximum Assistance, X 1   Rolling to Right: Moderate Assistance, X 1   Supine to Sit: Maximum Assistance, X 1  Sit to Supine: Maximum Assistance, X 1   Scooting: Maximum Assistance, X 1, fwd in sitting to EOB  HOB up 30 degrees, and use BR  Transfers:  Sit to Stand: Maximum Assistance, X 1  Stand to WUJ:WJXBJYN Assistance, X 1  BUE HHA, NBOS, fwd head posture, cues for wt shift fwd  Ambulation:  Not Tested    Stairs:  Not  Tested    Exercise:  None    Functional Outcome Measures:  AMPAC (6 CLICK) BASIC MOBILITY     AM-PAC Inpatient Mobility without Stair Climbing Raw Score : 9  AM-PAC Inpatient without Stair Climbing T-Scale Score : 32.44       Modified Rankin:  Premorbid Functional Status: Not Applicable  Current Functional Status:  Not Applicable    ASSESSMENT:  Activity Tolerance:  Patient tolerance of treatment:Fair.    Treatment Initiated: Treatment and education initiated within context of evaluation.  Evaluation time included review of current medical information, gathering information related to past medical, social and functional history, completion of standardized testing, formal and informal observation of tasks, assessment of data and development of plan of care and goals.  Treatment time included skilled education and facilitation of tasks to increase safety and independence with functional mobility for improved independence and quality of life.    Assessment:  Body Structures, Functions, Activity Limitations Requiring Skilled Therapeutic Intervention: Decreased functional mobility , Decreased strength, Decreased endurance, Decreased balance  Assessment: Michelle Bright is a 73 y.o. female that presents with bradycardia. Pt demonstrates a decrease in baseline by way of bed mobility, transfers and ambulation secondary to decreased activity tolerance, strength, fatigue, and balance deficits. Pt will benefit from skilled PT services throughout admission and beyond hospital discharge for improvements in functional mobility and in order to decrease fall risk and return pt to PLOF.     Therapy Prognosis: Good    Requires PT Follow-Up: Yes    Patient Education:      .    Patient Education  Education Given To: Patient  Education Provided: Role of Therapy, Plan of Care  Education Method: Verbal  Education Outcome: Verbalized understanding, Continued education needed       Plan:  Current Treatment Recommendations: Strengthening,  Balance training, Endurance training, Functional mobility training, Transfer training, Home exercise program, Equipment evaluation, education, & procurement, Patient/Caregiver education & training, Therapeutic activities, Safety education & training  General Plan:  (3-5X GM)    Goals:  Patient Goals : not stated  Short Term Goals  Time Frame for Short Term Goals: by discharge  Short Term Goal 1: bed mobility with minA to get in/out of bed  Short Term Goal 2: tolerate >15 min sitting balance activity with </=S to demonstrate improved strength for progression with functional mobility  Short Term Goal 3: sit to/from std with minAx1 to get in/out of chairs  Short Term Goal 4: std pivot transfer with minAx1 to get bed to/from chair safely  Short Term Goal 5: PT to assess amb when appropriate  Long Term Goals  Time Frame for Long Term Goals : no LTGs set secondary to short ELOS    Following session, patient left in safe position with all fall risk precautions in place.

## 2023-04-04 NOTE — Care Coordination-Inpatient (Signed)
 04/04/23 7:04 AM    Pt transferred to 4K4. Handoff report given to [place], [place] CM

## 2023-04-04 NOTE — Anesthesia Pre-Procedure Evaluation (Signed)
 Department of Anesthesiology  Preprocedure Note       Name:  Michelle Bright   Age:  73 y.o.  DOB:  [birth year]                                          MRN:  [id number]         Date:  04/04/2023      Surgeon: Moishe Spice):  Andres Labrum, MD    Procedure: Procedure(s):  Insert PPM dual    Medications prior to admission:   Prior to Admission medications    Medication Sig Start Date End Date Taking? Authorizing Provider   apixaban (ELIQUIS) 5 MG TABS tablet Take 1 tablet by mouth 2 times daily    [provider]   HYDROcodone-acetaminophen (NORCO) 5-325 MG per tablet Take 1 tablet by mouth every 6 hours as needed for Pain. Max Daily Amount: 4 tablets    [provider]   hydrOXYzine HCl (ATARAX) 10 MG tablet Take 1 tablet by mouth 3 times daily as needed for Anxiety    [provider]   lactobacillus (CULTURELLE) capsule Take 1 capsule by mouth daily (with breakfast) 12/31/22   Peitz, Dorathy Daft, PA-C   lidocaine 4 % external patch Place 2 patches onto the skin daily 12/31/22   Peitz, Dorathy Daft, PA-C   sacubitril-valsartan (ENTRESTO) 49-51 MG per tablet Take 1 tablet by mouth 2 times daily Hold until repeat BMP and PCP follow up  Patient not taking: Reported on 01/11/2023 12/30/22   Peitz, Dorathy Daft, PA-C   FARXIGA 10 MG tablet TAKE ONE TABLET BY MOUTH EVERY MORNING 12/14/22   Crecencio Mc, APRN - CNP   amiodarone (CORDARONE) 200 MG tablet Take 1 tablet by mouth daily    [provider]   spironolactone (ALDACTONE) 25 MG tablet Take 1 tablet by mouth daily 03/30/22   Crecencio Mc, APRN - CNP   furosemide (LASIX) 20 MG tablet Take 1 tablet by mouth daily 03/30/22   Crecencio Mc, APRN - CNP   Coenzyme Q10 (COQ10) 200 MG CAPS Take by mouth Daily  Patient not taking: Reported on 01/11/2023    [provider]   metoprolol succinate (TOPROL XL) 25 MG extended release tablet Take 0.5 tablets by mouth daily 01/28/22   Crecencio Mc, APRN - CNP   Multiple Vitamins-Minerals (THERAPEUTIC  MULTIVITAMIN-MINERALS) tablet Take 1 tablet by mouth daily    [provider]   Cholecalciferol (VITAMIN D3) 50 MCG (2000 UT) CAPS Take by mouth    [provider]   D-Mannose 500 MG CAPS Take by mouth 2 times daily   Patient not taking: Reported on 01/11/2023    [provider]   Ascorbic Acid (VITAMIN C) 500 MG CHEW Take 500 mg by mouth daily    [provider]   nitroGLYCERIN (NITROSTAT) 0.4 MG SL tablet up to max of 3 total doses. If no relief after 1 dose, call 911. 04/11/15   Marcell Anger, MD   acetaminophen (TYLENOL) 500 MG tablet Take 1 tablet by mouth every 6 hours as needed for Pain  Patient not taking: Reported on 01/11/2023    [provider]   warfarin (COUMADIN) 3 MG tablet Take 1 tablet by mouth daily    [provider]   levothyroxine (SYNTHROID) 125 MCG tablet Take 1 tablet by mouth Daily    [provider]  Current medications:    Current Facility-Administered Medications   Medication Dose Route Frequency Provider Last Rate Last Admin   . amLODIPine (NORVASC) tablet 5 mg  5 mg Oral BID Michae Kava, APRN - CNP       . [Held by provider] amiodarone (CORDARONE) tablet 200 mg  200 mg Oral Daily Harshfield, Maryruth Eve, APRN - CNP       . [Held by provider] metoprolol succinate (TOPROL XL) extended release tablet 25 mg  25 mg Oral Daily Harshfield, Maryruth Eve, APRN - CNP       . ceFAZolin (ANCEF) 2,000 mg in sterile water 20 mL IV syringe  2,000 mg IntraVENous On Call to OR Andres Labrum, MD       . ceFAZolin (ANCEF) 1,000 mg in sod chloride IRR soln 0.9 % 250 mL irrigation   Irrigation Once Andres Labrum, MD       . hydrALAZINE (APRESOLINE) injection 10 mg  10 mg IntraVENous Q6H PRN Hinegardner, William, DO       . lidocaine 4 % external patch 1 patch  1 patch TransDERmal Daily Karl Luke M, DO   1 patch at 04/04/23 [phone]   . albuterol (PROVENTIL) nebulizer solution 2.5 mg  2.5 mg Nebulization Q6H PRN Begmatov, Hoshimjon J, DO        . furosemide (LASIX) tablet 20 mg  20 mg Oral Daily Regis Bill, PA-C   20 mg at 04/04/23 6295   . oxyCODONE-acetaminophen (PERCOCET) 5-325 MG per tablet 1 tablet  1 tablet Oral Q6H PRN Begmatov, Hoshimjon J, DO   1 tablet at 04/03/23 1421   . sodium chloride flush 0.9 % injection 5-40 mL  5-40 mL IntraVENous 2 times per day Hinegardner, William, DO   10 mL at 04/04/23 0846   . sodium chloride flush 0.9 % injection 5-40 mL  5-40 mL IntraVENous PRN Hinegardner, William, DO   10 mL at 04/03/23 1009   . 0.9 % sodium chloride infusion   IntraVENous PRN Hinegardner, William, DO 15 mL/hr at 04/01/23 2841 Rate Verify at 04/01/23 3244   . potassium chloride 20 mEq/50 mL IVPB (Central Line)  20 mEq IntraVENous PRN Hinegardner, William, DO        Or   . potassium chloride 10 mEq/100 mL IVPB (Peripheral Line)  10 mEq IntraVENous PRN Hinegardner, William, DO       . magnesium sulfate 2000 mg in 50 mL IVPB premix  2,000 mg IntraVENous PRN Hinegardner, William, DO       . polyethylene glycol (GLYCOLAX) packet 17 g  17 g Oral Daily PRN Hinegardner, William, DO       . acetaminophen (TYLENOL) tablet 650 mg  650 mg Oral Q6H PRN Hinegardner, William, DO   650 mg at 04/03/23 2339    Or   . acetaminophen (TYLENOL) suppository 650 mg  650 mg Rectal Q6H PRN Hinegardner, William, DO       . levothyroxine (SYNTHROID) tablet 125 mcg  125 mcg Oral Daily Hinegardner, William, DO   125 mcg at 04/04/23 0102   . [Held by provider] empagliflozin (JARDIANCE) tablet 10 mg  10 mg Oral Daily Hinegardner, William, DO       . calcium gluconate 2,000 mg in sodium chloride 100 mL  2,000 mg IntraVENous PRN Hinegardner, William, DO       . prochlorperazine (COMPAZINE) injection 10 mg  10 mg IntraVENous Q6H PRN Hinegardner, William, DO   10 mg at 04/02/23 1331  Allergies:    Allergies   Allergen Reactions   . Xarelto [Rivaroxaban] Dizziness or Vertigo       Problem List:    Patient Active Problem List   Diagnosis Code   . Paroxysmal atrial  fibrillation (HCC) I48.0   . HFrEF (heart failure with reduced ejection fraction) (HCC) I50.20   . AKI (acute kidney injury) N17.9   . Sacral fracture, closed (HCC) S32.10XA   . Hyperkalemia E87.5   . Compression fracture of T3 vertebra (HCC) S22.030A   . Acute cystitis with hematuria N30.01   . Supratherapeutic INR R79.1   . Chronic HFrEF (heart failure with reduced ejection fraction) (HCC) I50.22   . Bradycardia R00.1   . Physical deconditioning R53.81   . Ischemic cardiomyopathy I25.5   . Moderate mitral regurgitation I34.0   . Moderate tricuspid regurgitation I07.1   . Heart failure with reduced ejection fraction (HFrEF, <= 40%) (HCC) I50.20   . Symptomatic bradycardia R00.1   . Family history of CVA Z82.3   . Uncontrolled hypertension I10       Past Medical History:        Diagnosis Date   . Arthritis    . Atrial fibrillation (HCC)    . CAD (coronary artery disease)     Afib    . Cerebral artery occlusion with cerebral infarction (HCC)     frequency of urine   . Hypertension    . Osteoporosis    . Prolapse of female bladder, acquired    . Thyroid disease     Removed        Past Surgical History:        Procedure Laterality Date   . BLADDER SURGERY  03/11/2021    pessary   . CARDIAC PROCEDURE Left 12/22/2021    Left heart cath / coronary angiography performed by Archie Balboa, MD at [facility] CARDIAC CATH LAB   . FEMUR SURGERY  12/2014    left femur   . FRACTURE SURGERY     . SPINE SURGERY N/A 12/26/2022    KYPHOPLASTY S1-2 + T5 performed by Reyne Dumas, MD at [facility] OR   . STIMULATOR SURGERY N/A 02/11/2020    STAGE 1 INTERSTIM performed by Helane Rima, MD at [facility] OR   . STIMULATOR SURGERY N/A 02/25/2020    STAGE 2 STIMULATOR INSERTION performed by Helane Rima, MD at [facility] OR   . THYROID SURGERY  2005   . TRANSESOPHAGEAL ECHOCARDIOGRAM N/A 01/05/2022    TRANSESOPHAGEAL ECHOCARDIOGRAM performed by Archie Balboa, MD at [facility] ENDOSCOPY       Social History:    Social History     Tobacco Use   . Smoking status: Former      Current packs/day: 0.00     Types: Cigarettes     Quit date: 03/09/1992     Years since quitting: 31.0   . Smokeless tobacco: Never   Substance Use Topics   . Alcohol use: Not Currently                                Counseling given: Not Answered      Vital Signs (Current):   Vitals:    04/04/23 0800 04/04/23 0841 04/04/23 1118 04/04/23 1130   BP: (!) 168/71 (!) 168/71 (!) 168/72 (!) 168/72   Pulse: 55   56   Resp: 16   16   Temp: 97.6 F (36.4 C)  98.1 F (36.7 C)   TempSrc: Oral   Oral   SpO2: 93%   95%   Weight:       Height:                                                  BP Readings from Last 3 Encounters:   04/04/23 (!) 168/72   01/11/23 111/69   12/30/22 130/62       NPO Status:                                                                                 BMI:   Wt Readings from Last 3 Encounters:   04/03/23 50.2 kg (110 lb 10.7 oz)   12/30/22 69.8 kg (153 lb 14.1 oz)   10/13/22 45.7 kg (100 lb 11.2 oz)     Body mass index is 22.34 kg/m.    CBC:   Lab Results   Component Value Date/Time    WBC 8.3 04/04/2023 05:53 AM    RBC 3.81 04/04/2023 05:53 AM    RBC 17 01/21/2020 09:08 AM    HGB 11.4 04/04/2023 05:53 AM    HCT 36.7 04/04/2023 05:53 AM    MCV 96.3 04/04/2023 05:53 AM    RDW 15.2 04/11/2015 05:15 AM    PLT 181 04/04/2023 05:53 AM       CMP:   Lab Results   Component Value Date/Time    NA 136 04/04/2023 05:53 AM    K 4.2 04/04/2023 05:53 AM    K 5.2 12/24/2022 03:27 AM    CL 97 04/04/2023 05:53 AM    CO2 30 04/04/2023 05:53 AM    BUN 34 04/04/2023 05:53 AM    CREATININE 0.8 04/04/2023 05:53 AM    LABGLOM 77 04/04/2023 05:53 AM    LABGLOM 86 04/13/2022 12:16 PM    GLUCOSE 100 04/04/2023 05:53 AM    CALCIUM 8.8 04/04/2023 05:53 AM    BILITOT 0.3 04/01/2023 08:17 AM    BILITOT Negative 01/21/2020 09:08 AM    ALKPHOS 125 04/01/2023 08:17 AM    AST 33 04/01/2023 08:17 AM    ALT 15 04/01/2023 08:17 AM       POC Tests: No results for input(s): "POCGLU", "POCNA", "POCK", "POCCL", "POCBUN", "POCHEMO",  "POCHCT" in the last 72 hours.    Coags:   Lab Results   Component Value Date/Time    INR 1.61 04/03/2023 04:01 PM    APTT 28.2 04/04/2023 05:53 AM       HCG (If Applicable): No results found for: "PREGTESTUR", "PREGSERUM", "HCG", "HCGQUANT"     ABGs: No results found for: "PHART", "PO2ART", "PCO2ART", "HCO3ART", "BEART", "O2SATART"     Type & Screen (If Applicable):  Lab Results   Component Value Date    LABANTI POS 12/22/2021       Drug/Infectious Status (If Applicable):  No results found for: "HIV", "HEPCAB"    COVID-19 Screening (If Applicable): No results found for: "COVID19"        Anesthesia Evaluation  Patient  summary reviewed   no history of anesthetic complications:   Airway: Mallampati: II          Dental:    (+) poor dentition      Pulmonary:       (-) COPD and asthma                           Cardiovascular:    (+) hypertension:, CAD:    (-) past MI                Neuro/Psych:   (+) CVA (left sided weakness):   (-) seizures           GI/Hepatic/Renal:        (-) GERD, liver disease and no renal disease       Endo/Other:    (+) hypothyroidism::..                 Abdominal:             Vascular:     - DVT.      Other Findings:         Anesthesia Plan      MAC     ASA 3       Induction: intravenous.      Anesthetic plan and risks discussed with patient.      Plan discussed with CRNA.                Eloy End, DO   04/04/2023

## 2023-04-04 NOTE — Progress Notes (Signed)
 2130 Report taken from nurse Rn Dessie Coma.    Head to Toe Assessment    [facility] Student Nurse  Head to Toe Assessment Performed at 855  Skin  General skin appearance / Description: warm, dry, pink  Incisions / Wounds: wound on coccyx, blanchable. Clean, dry, intact    Neuro/Head  LOC: A+O x 4  Speech: clear and appropriate  Eyes PERRLA 3 mm  Symmetry of face / tongue: warm and dry  JVD of neck: no    Chest  Heart sounds / Apical Pulse: regular rhythm, strong  Dyspnea: with exertion  Lung sounds: clear, unlabored  Cough: no    Abdomen/ Pelvis  Bowel sounds: active  x 4  Passing flatus: no  Palpation / Inspection: soft, tender  Last BM: 03/09  Stool assessment: n/a  Any GI issues: no   Urinary issues: no  Continence: Yes, external cath. Output  Urine color / turbidity: clear, yellow/straw color    Extremities  Edema: no  Altered Sensation: sensation present  Upper extremity push / pulls: moderate, equal  Left Arm Radial Pulse: +2 moderate   Left Upper extremity Capillary Refill: <3 secs  Right Arm Radial Pulse: +2 moderate   Right Upper extremity Capillary Refill: <3 secs  Lower extremity pedal push / pulls: moderate, equal  Left pedal pulse: +2   Left posterior tibialis pulse: +3   Left lower extremity capillary refill: <3 secs  Right pedal pulse: +2   Right posterior tibialis pulse: +3   Right lower extremity capillary refill: <3 secs  Drift of extremities: not present  Pain: no    Other issues: no    1120 Reassessment done no changes  1400 Reported off to primary RN, Jen G.    1400 [address]. Shawnie Dapper  [facility] / SN

## 2023-04-04 NOTE — Care Coordination-Inpatient (Signed)
 04/04/23, 7:27 AM EDT    DISCHARGE BARRIERS        Patient transferred to 4K 4. Report given to unit SW, Cassie G, regarding discharge plan for this patient.

## 2023-04-05 LAB — BASIC METABOLIC PANEL
BUN: 29 mg/dL — ABNORMAL HIGH (ref 8–23)
CO2: 30 meq/L — ABNORMAL HIGH (ref 22–29)
Calcium: 8.6 mg/dL — ABNORMAL LOW (ref 8.8–10.2)
Chloride: 94 meq/L — ABNORMAL LOW (ref 98–111)
Creatinine: 0.8 mg/dL (ref 0.5–0.9)
Glucose: 91 mg/dL (ref 74–109)
Potassium: 4 meq/L (ref 3.5–5.2)
Sodium: 138 meq/L (ref 135–145)

## 2023-04-05 LAB — CBC
Hematocrit: 37.6 % (ref 37.0–47.0)
Hemoglobin: 11.6 g/dL — ABNORMAL LOW (ref 12.0–16.0)
MCH: 29.4 pg (ref 26.0–33.0)
MCHC: 30.9 g/dL — ABNORMAL LOW (ref 32.2–35.5)
MCV: 95.4 fL (ref 81.0–99.0)
MPV: 10.7 fL (ref 9.4–12.4)
Platelets: 183 10*3/uL (ref 130–400)
RBC: 3.94 10*6/uL — ABNORMAL LOW (ref 4.20–5.40)
RDW-CV: 15.8 % — ABNORMAL HIGH (ref 11.5–14.5)
RDW-SD: 56 fL — ABNORMAL HIGH (ref 35.0–45.0)
WBC: 9.4 10*3/uL (ref 4.8–10.8)

## 2023-04-05 LAB — PROTIME-INR: INR: 1.12 (ref 0.85–1.13)

## 2023-04-05 LAB — ANION GAP: Anion Gap: 14 meq/L (ref 8.0–16.0)

## 2023-04-05 LAB — GLOMERULAR FILTRATION RATE, ESTIMATED: Est, Glom Filt Rate: 77 mL/min/{1.73_m2} (ref >60)

## 2023-04-05 LAB — CULTURE, BLOOD 1

## 2023-04-05 LAB — CULTURE, BLOOD 2

## 2023-04-05 MED ORDER — FUROSEMIDE 10 MG/ML IJ SOLN
10 | Freq: Once | INTRAMUSCULAR | Status: AC
Start: 2023-04-05 — End: 2023-04-05
  Administered 2023-04-05: 20:00:00 40 mg via INTRAVENOUS

## 2023-04-05 MED ORDER — SPIRONOLACTONE 25 MG PO TABS
25 | Freq: Every day | ORAL | Status: DC
Start: 2023-04-05 — End: 2023-04-08
  Administered 2023-04-05 – 2023-04-08 (×4): 25 mg via ORAL

## 2023-04-05 MED ORDER — WARFARIN DAILY DOSING BY PROVIDER (PLACEHOLDER)
Status: DC
Start: 2023-04-05 — End: 2023-04-06

## 2023-04-05 MED ORDER — WARFARIN SODIUM 1 MG PO TABS
1 | Freq: Once | ORAL | Status: AC
Start: 2023-04-05 — End: 2023-04-05
  Administered 2023-04-05: 0.5 mg via ORAL

## 2023-04-05 MED ORDER — WARFARIN SODIUM 1 MG PO TABS
1 | Freq: Once | ORAL | Status: AC
Start: 2023-04-05 — End: 2023-04-05
  Administered 2023-04-05: 1 mg via ORAL

## 2023-04-05 MED ORDER — APIXABAN 5 MG PO TABS
5 | Freq: Two times a day (BID) | ORAL | Status: DC
Start: 2023-04-05 — End: 2023-04-05

## 2023-04-05 MED ORDER — VENLAFAXINE HCL ER 37.5 MG PO CP24
37.5 | Freq: Every day | ORAL | Status: DC
Start: 2023-04-05 — End: 2023-04-08
  Administered 2023-04-05 – 2023-04-08 (×4): 37.5 mg via ORAL

## 2023-04-05 MED FILL — COUMADIN 1 MG PO TABS: 1 MG | ORAL | Qty: 1 | Fill #0

## 2023-04-05 MED FILL — ACETAMINOPHEN 325 MG PO TABS: 325 MG | ORAL | Qty: 2 | Fill #0

## 2023-04-05 MED FILL — AMLODIPINE BESYLATE 5 MG PO TABS: 5 MG | ORAL | Qty: 1 | Fill #0

## 2023-04-05 MED FILL — SYNTHROID 125 MCG PO TABS: 125 MCG | ORAL | Qty: 1 | Fill #0

## 2023-04-05 MED FILL — FUROSEMIDE 10 MG/ML IJ SOLN: 10 MG/ML | INTRAMUSCULAR | Qty: 4 | Fill #0

## 2023-04-05 MED FILL — SPIRONOLACTONE 25 MG PO TABS: 25 MG | ORAL | Qty: 1 | Fill #0

## 2023-04-05 MED FILL — METOPROLOL SUCCINATE ER 25 MG PO TB24: 25 MG | ORAL | Qty: 1 | Fill #0

## 2023-04-05 MED FILL — VENLAFAXINE HCL ER 37.5 MG PO CP24: 37.5 MG | ORAL | Qty: 1 | Fill #0

## 2023-04-05 MED FILL — LIDOCARE ARM/NECK/LEG 4 % EX PTCH: 4 % | CUTANEOUS | Qty: 1 | Fill #0

## 2023-04-05 MED FILL — AMIODARONE HCL 200 MG PO TABS: 200 MG | ORAL | Qty: 1 | Fill #0

## 2023-04-05 MED FILL — FUROSEMIDE 20 MG PO TABS: 20 MG | ORAL | Qty: 1 | Fill #0

## 2023-04-05 NOTE — Plan of Care (Signed)
 Problem: Discharge Planning  Goal: Discharge to home or other facility with appropriate resources  Outcome: Progressing  Flowsheets (Taken 04/05/2023 [phone])  Discharge to home or other facility with appropriate resources:   Identify barriers to discharge with patient and caregiver   Identify discharge learning needs (meds, wound care, etc)     Problem: Safety - Adult  Goal: Free from fall injury  Outcome: Progressing  Flowsheets (Taken 04/05/2023 0626)  Free From Fall Injury: Instruct family/caregiver on patient safety     Problem: Pain  Goal: Verbalizes/displays adequate comfort level or baseline comfort level  Outcome: Progressing  Flowsheets (Taken 04/05/2023 0626)  Verbalizes/displays adequate comfort level or baseline comfort level:   Encourage patient to monitor pain and request assistance   Administer analgesics based on type and severity of pain and evaluate response   Consider cultural and social influences on pain and pain management   Assess pain using appropriate pain scale   Implement non-pharmacological measures as appropriate and evaluate response     Problem: Cardiovascular - Adult  Goal: Maintains optimal cardiac output and hemodynamic stability  Outcome: Progressing  Flowsheets (Taken 04/05/2023 0626)  Maintains optimal cardiac output and hemodynamic stability:   Monitor blood pressure and heart rate   Assess for signs of decreased cardiac output   Monitor urine output and notify Licensed Independent Practitioner for values outside of normal range   Administer vasoactive medications as ordered     Problem: Cardiovascular - Adult  Goal: Absence of cardiac dysrhythmias or at baseline  Outcome: Progressing  Flowsheets (Taken 04/05/2023 0626)  Absence of cardiac dysrhythmias or at baseline:   Monitor cardiac rate and rhythm   Administer antiarrhythmia medication and electrolyte replacement as ordered   Assess for signs of decreased cardiac output

## 2023-04-05 NOTE — Progress Notes (Signed)
 [facility]  INPATIENT PHYSICAL THERAPY  DAILY NOTE  [facility] ICU STEPDOWN TELEMETRY 4K - 4K-04/004-A      Discharge Recommendations: Subacute/Skilled Nursing Facility, 24 hour assistance or supervision, and Patient would benefit from continued PT at discharge  Equipment Recommendations:    cont to assess needs          Time In: 1103  Time Out: 1127  Timed Code Treatment Minutes: 24 Minutes  Minutes: 24          Date: 04/05/2023  Patient Name: Michelle Bright,  Gender:  female        MRN: [id number]  DOB: 08/20/1950  (73 y.o.)     Referring Practitioner: Dr. Shawnie Dapper  Diagnosis: Bradycardia  Additional Pertinent Hx: Per H&P: 73 year old female past medical history nonischemic cardiomyopathy, hypertension, hypothyroidism, paroxysmal atrial fibrillation presenting from outside hospital for symptomatic bradycardia.  Has been having fatigue and palpitations for approximately 1 week.  Found to have a significantly low heart rate with concern for complete heart block outside facility.  Was started on dobutamine and transferred to [facility] for further evaluation.  On arrival to [facility], patient was switched from dobutamine to dopamine.     Prior Level of Function:  Lives With: Spouse, Daughter  Type of Home: House  Home Layout: One level  Home Access: Stairs to enter without rails  Entrance Stairs - Number of Steps: 3  Home Equipment: Designer, industrial/product, Wheelchair - Manual   Bathroom Shower/Tub: Medical sales representative: Handicap height  Bathroom Equipment: Grab bars in Neurosurgeon Help From: Home health [facility] RN prior)  Prior Level of Assist for ADLs: Needs assistance (pt reported normally tries to complete all ADLs on own, although frequently requires assist with toileting and showering tasks)  Prior Level of Assist for Homemaking: Needs assistance  Prior Level of Assist for Transfers: Needs assistance (occasional assist)  Active Driver: No  Additional Comments: Question accuracy of pt's reported PLOF. Pt  reported primarily uses RW for all mobility, although later in session reported uses w/c primarily. Pt reported husband or daughter are home all the time with her  Prior Level of Assist for Ambulation: Independent household ambulator, with or without device (always has assist/supervision with walking)    Restrictions/Precautions:  Restrictions/Precautions: Fall Risk  Position Activity Restriction  Other Position/Activity Restrictions: PPM pacemaker 3/11, sling 24hrs, do not elevate affected arm above shoulder for 6 weeks     SUBJECTIVE: RN approved therapy session. Pt upright in wheelchair upon arrival. Pt agreeable to get back into bed and complete exercises after. A&O 4/4.     PAIN: states some discomfort in the LUE but did not quantify. Denied any other pain.     Vitals: Vitals not assessed per clinical judgement, see nursing flowsheet    OBJECTIVE:  Bed Mobility:  Sit to Supine: Maximum Assistance, with head of bed flat, without rail, with increased time for completion   Scooting: Dependent, with head of bed flat, utilized hercules sheet  -Max A to scoot pt towards middle of bed.     Transfers:  Sit Pivot:Maximum Assistance, with increased time for completion, provided verbal cues for pt to assist with BLEs as much as possible but pt demonstrated increased weakness and inability to bear much weight through legs.     Ambulation:  Not Tested    Stairs:  Not Tested    Balance:  Not Tested    Exercise:  Patient was guided in  1 set(s) 10 reps of active assistive exercises to both lower extremities: Ankle pumps, Glut sets, Heelslides, Hip abduction/adduction, and Straight leg raises.  Exercises were completed for increased independence with functional mobility.  -Pt demonstrated more weakness in LLE than RLE and able to assist more with RLE.   -Demonstrated limited ROM in B knees and hips when completing SLR, heel slides, and abduction/adduction.     Functional Outcome Measures:  AMPAC (6 CLICK) BASIC MOBILITY      AM-PAC Inpatient Mobility without Stair Climbing Raw Score : 9  AM-PAC Inpatient without Stair Climbing T-Scale Score : 32.44     Modified Rankin:  Current Functional Status:  Not Applicable    ASSESSMENT:  Assessment: Patient progressing toward established goals.  Activity Tolerance:  Patient tolerance of  treatment:Fair.  Plan: Current Treatment Recommendations: Strengthening, Balance training, Endurance training, Functional mobility training, Transfer training, Home exercise program, Equipment evaluation, education, & procurement, Patient/Caregiver education & training, Therapeutic activities, Safety education & training  General Plan:  (3-5X GM)    Education:  Learners: Patient  Patient Education: Plan of Care, Bed Mobility, Transfers, Verbal Exercise Instruction    Goals:  Patient Goals : not stated  Short Term Goals  Time Frame for Short Term Goals: by discharge  Short Term Goal 1: bed mobility with minA to get in/out of bed  Short Term Goal 2: tolerate >15 min sitting balance activity with </=S to demonstrate improved strength for progression with functional mobility  Short Term Goal 3: sit to/from std with minAx1 to get in/out of chairs  Short Term Goal 4: std pivot transfer with minAx1 to get bed to/from chair safely  Short Term Goal 5: PT to assess amb when appropriate  Long Term Goals  Time Frame for Long Term Goals : no LTGs set secondary to short ELOS    Following session, patient left in safe position with all fall risk precautions in place.     Tammi Wandalee Ferdinand , licensed Therapist was present and directly responsible for all treatments provided by, Nestor Lewandowsky, SPTA

## 2023-04-05 NOTE — Progress Notes (Addendum)
 Cardiology Progress Note      Patient:  Michelle Bright  Date of Birth: December 07, 1950  MRN: [id number]   Acct: [id number]  Admit Date:  03/31/2023  Primary Cardiologist: Geradine Girt   Seen by dr. Gabrielle Dare    Per prior cardiology consult note-  REASON FOR CONSULTATION:  Symptomatic bradycardia.     HISTORY OF PRESENT ILLNESS:  This is a pleasant 73 year old lady, who apparently follows with Dr. Geradine Girt, who has a history of known atrial fibrillation, who came in with symptoms of fatigue, tiredness, palpitation, and no obvious syncope.  Was found to have significant bradycardia.  Was admitted for a diagnosis of symptomatic bradycardia.  She initially presented to outlying hospital and was subsequently transferred to [facility].  The patient has been maintained on amiodarone and metoprolol as an outpatient which has been on hold.  Denies any active chest pain.  Does have some baseline shortness of breath.      Subjective (Events in last 24 hours):   Pt in bed   Tele SR hr 60's     BP elevated   Pt co headache at present     Per family pt taking toprol 12.5  amio 200 day    Pt states no chest pains recently -- she does correspond dizziness recently     Per husband pt has been having spells of staring and not responding  Lately       Pt agreeable to [facility]       04/04/23  Pt in bed     Tele review - SB overnight with intermittent junctional hr 38 lowest - mostly 40   With awake up to 59   BP stable   Pt appears to be SOB with rest - she denies this   She denies chest pains     Discussed with Dr. Don Broach - PPM - she will be added on today     Discussed with pt - she understands ppm / restrictions and why she needs PPM     04/05/23  Pt in bed   family at bedside - husband and daughter     She states she feeling well - slight pain to LU chest   Dressing over PPM - dry and intact - neurovascular check WNL     Pt diuresed around 2L yesterday with IV diuresis - she states she is feeling better today     Tele paced       Objective:   BP  126/60   Pulse 60   Temp 98.5 F (36.9 C) (Axillary)   Resp 16   Ht 1.499 m (4' 11.02")   Wt 52.2 kg (115 lb 1.3 oz)   SpO2 94%   BMI 23.23 kg/m        TELEMETRY: SR HR 59 - with sleep HR 40 - intermittent junctional with lowest HR overnight 38    Physical Exam:  General Appearance: alert and oriented to person, place- , in no acute distress  Cardiovascular: normal rate, regular rhythm, normal S1 and S2, no murmurs, rubs, clicks, or gallops, distal pulses intact  Pulmonary/Chest: diminished to auscultation bilaterally- no wheezes, rales or rhonchi, normal air movement, no respiratory distress  Abdomen: soft, non-tender, non-distended, normal bowel sounds, no masses Extremities: no cyanosis, clubbing or edema, pulses present    Musculoskeletal: normal range of motion, no joint swelling, deformity or tenderness  Neurological: alert, oriented to self and family and place- , normal speech, no focal findings or movement  disorder noted    Medications:    venlafaxine  37.5 mg Oral Daily with breakfast    amLODIPine  5 mg Oral BID    amiodarone  200 mg Oral Daily    metoprolol succinate  25 mg Oral Daily    ceFAZolin (ANCEF) 1,000 mg in sod chloride IRR soln 0.9 % 250 mL irrigation   Irrigation Once    sodium chloride flush  5-40 mL IntraVENous 2 times per day    lidocaine  1 patch TransDERmal Daily    furosemide  20 mg Oral Daily    sodium chloride flush  5-40 mL IntraVENous 2 times per day    levothyroxine  125 mcg Oral Daily    [Held by provider] empagliflozin  10 mg Oral Daily      sodium chloride      sodium chloride 15 mL/hr at 04/04/23 1342     sodium chloride flush, 5-40 mL, PRN  sodium chloride, , PRN  acetaminophen, 650 mg, Q4H PRN  hydrALAZINE, 10 mg, Q6H PRN  albuterol, 2.5 mg, Q6H PRN  oxyCODONE-acetaminophen, 1 tablet, Q6H PRN  sodium chloride flush, 5-40 mL, PRN  sodium chloride, , PRN  potassium chloride, 20 mEq, PRN   Or  potassium chloride, 10 mEq, PRN  magnesium sulfate, 2,000 mg,  PRN  polyethylene glycol, 17 g, Daily PRN  acetaminophen, 650 mg, Q6H PRN  calcium gluconate, 2,000 mg, PRN  prochlorperazine, 10 mg, Q6H PRN        Diagnostics:    Echo: 04/01/23  Left Ventricle Normal left ventricular systolic function with a visually estimated EF of 55 - 60%. Left ventricle size is normal. Normal wall thickness. Normal wall motion. Abnormal diastolic function.   Left Atrium Left atrium is moderately dilated.   Right Ventricle Right ventricle size is normal. Normal systolic function.   Right Atrium Right atrium size is normal.   Aortic Valve Mildly thickened cusps. Moderately calcified cusps. Mild regurgitation. 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   Mitral Valve Mildly calcified leaflets. Mild regurgitation. No stenosis noted.   Tricuspid Valve Valve structure is normal. Mild regurgitation. No stenosis noted.   Pulmonic Valve The pulmonic valve visualization is suboptimal but appears to be functioning normally. Physiologically normal regurgitation. No stenosis noted.   Aorta Normal sized aortic root and ascending aorta.   IVC/Hepatic Veins IVC diameter is less than or equal to 21 mm and decreases greater than 50% during inspiration; therefore the estimated right atrial pressure is normal (~3 mmHg). IVC size is normal.   Pericardium No pericardial effusion.           Left Heart Cath: 12/22/21  Patent coronaries, abnormal LV function   EF 35-40%  No complications     Diagnostic  Dominance: Right  No diagnostic findings have been documented.  Intervention    No interventions have been documented.  Left Heart    Left Ventricle There is moderate left ventricular systolic dysfunction. The estimated EF = 35 - 40%.     Coronary Diagram    Diagnostic  Dominance: Right    Lab Data:    Cardiac Enzymes:  No results for input(s): "CKTOTAL", "CKMB", "CKMBINDEX", "TROPONINI" in the last 72 hours.    CBC:   Lab Results   Component Value Date/Time    WBC 9.4 04/05/2023 03:25 AM    RBC 3.94 04/05/2023  03:25 AM    RBC 17 01/21/2020 09:08 AM    HGB 11.6 04/05/2023 03:25 AM  HCT 37.6 04/05/2023 03:25 AM    PLT 183 04/05/2023 03:25 AM       CMP:    Lab Results   Component Value Date/Time    NA 138 04/05/2023 03:25 AM    K 4.0 04/05/2023 03:25 AM    K 5.2 12/24/2022 03:27 AM    CL 94 04/05/2023 03:25 AM    CO2 30 04/05/2023 03:25 AM    BUN 29 04/05/2023 03:25 AM    CREATININE 0.8 04/05/2023 03:25 AM    LABGLOM 77 04/05/2023 03:25 AM    LABGLOM 86 04/13/2022 12:16 PM    GLUCOSE 91 04/05/2023 03:25 AM    CALCIUM 8.6 04/05/2023 03:25 AM       Hepatic Function Panel:    Lab Results   Component Value Date/Time    ALKPHOS 125 04/01/2023 08:17 AM    ALT 15 04/01/2023 08:17 AM    AST 33 04/01/2023 08:17 AM    BILITOT 0.3 04/01/2023 08:17 AM    BILITOT Negative 01/21/2020 09:08 AM    BILIDIR 0.2 04/01/2023 08:17 AM       Magnesium:    Lab Results   Component Value Date/Time    MG 2.1 04/02/2023 08:05 AM       PT/INR:    Lab Results   Component Value Date/Time    INR 1.61 04/03/2023 04:01 PM       HgBA1c:    Lab Results   Component Value Date/Time    LABA1C 5.6 12/28/2022 06:02 AM       FLP:    Lab Results   Component Value Date/Time    TRIG 107 04/11/2015 05:15 AM    HDL 64 04/11/2015 05:15 AM       TSH:    Lab Results   Component Value Date/Time    TSH 14.70 04/01/2023 03:27 AM         Assessment:    Pt staring off while walking - why she came to hospital   Hx CVA  ? 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 Tachy induced CDMP  Normal cath 11/2021      Ckd stage 3   Hx thyroidectomy       Plan:  Lasix 40 mg IV today   Restart aldactone   Restart coumadin   Follow          Electronically signed by Michae Kava, APRN - CNP on 04/05/2023 at 1:45 PM

## 2023-04-05 NOTE — Plan of Care (Signed)
 Problem: Chronic Conditions and Co-morbidities  Goal: Patient's chronic conditions and co-morbidity symptoms are monitored and maintained or improved  Outcome: Progressing  Flowsheets (Taken 04/05/2023 0800)  Care Plan - Patient's Chronic Conditions and Co-Morbidity Symptoms are Monitored and Maintained or Improved:   Monitor and assess patient's chronic conditions and comorbid symptoms for stability, deterioration, or improvement   Collaborate with multidisciplinary team to address chronic and comorbid conditions and prevent exacerbation or deterioration   Update acute care plan with appropriate goals if chronic or comorbid symptoms are exacerbated and prevent overall improvement and discharge     Problem: Discharge Planning  Goal: Discharge to home or other facility with appropriate resources  04/05/2023 1107 by Marlyn Corporal, RN  Outcome: Progressing  Flowsheets (Taken 04/05/2023 0800)  Discharge to home or other facility with appropriate resources:   Identify barriers to discharge with patient and caregiver   Arrange for needed discharge resources and transportation as appropriate   Identify discharge learning needs (meds, wound care, etc)     Problem: Safety - Adult  Goal: Free from fall injury  04/05/2023 1107 by Marlyn Corporal, RN  Outcome: Progressing     Problem: Pain  Goal: Verbalizes/displays adequate comfort level or baseline comfort level  04/05/2023 1107 by Marlyn Corporal, RN  Outcome: Progressing     Problem: Nutrition Deficit:  Goal: Optimize nutritional status  Outcome: Progressing     Problem: Cardiovascular - Adult  Goal: Maintains optimal cardiac output and hemodynamic stability  04/05/2023 1107 by Marlyn Corporal, RN  Outcome: Progressing  Flowsheets (Taken 04/05/2023 0800)  Maintains optimal cardiac output and hemodynamic stability:   Monitor blood pressure and heart rate   Monitor urine output and notify Licensed Independent Practitioner for values outside of normal range    Assess for signs of decreased cardiac output     Problem: Cardiovascular - Adult  Goal: Absence of cardiac dysrhythmias or at baseline  04/05/2023 1107 by Marlyn Corporal, RN  Outcome: Progressing  Flowsheets (Taken 04/05/2023 0800)  Absence of cardiac dysrhythmias or at baseline:   Monitor cardiac rate and rhythm   Assess for signs of decreased cardiac output     Problem: Neurosensory - Adult  Goal: Achieves stable or improved neurological status  Outcome: Progressing  Flowsheets (Taken 04/05/2023 0800)  Achieves stable or improved neurological status:   Assess for and report changes in neurological status   Initiate measures to prevent increased intracranial pressure   Maintain blood pressure and fluid volume within ordered parameters to optimize cerebral perfusion and minimize risk of hemorrhage   Monitor temperature, glucose, and sodium. Initiate appropriate interventions as ordered     Problem: Neurosensory - Adult  Goal: Achieves maximal functionality and self care  Outcome: Progressing  Flowsheets (Taken 04/05/2023 0800)  Achieves maximal functionality and self care: Monitor swallowing and airway patency with patient fatigue and changes in neurological status     Problem: Respiratory - Adult  Goal: Achieves optimal ventilation and oxygenation  Outcome: Progressing  Flowsheets (Taken 04/05/2023 0800)  Achieves optimal ventilation and oxygenation:   Assess for changes in respiratory status   Assess for changes in mentation and behavior   Position to facilitate oxygenation and minimize respiratory effort   Oxygen supplementation based on oxygen saturation or arterial blood gases   Encourage broncho-pulmonary hygiene including cough, deep breathe, incentive spirometry   Initiate smoking cessation protocol as indicated   Assess the need for suctioning and aspirate as needed   Assess and  instruct to report shortness of breath or any respiratory difficulty   Respiratory therapy support as indicated     Problem:  Skin/Tissue Integrity - Adult  Goal: Skin integrity remains intact  Outcome: Progressing  Flowsheets (Taken 04/05/2023 0800)  Skin Integrity Remains Intact:   Monitor for areas of redness and/or skin breakdown   Assess vascular access sites hourly     Problem: Skin/Tissue Integrity - Adult  Goal: Incisions, wounds, or drain sites healing without S/S of infection  Outcome: Progressing  Flowsheets (Taken 04/05/2023 0800)  Incisions, Wounds, or Drain Sites Healing Without Sign and Symptoms of Infection:   ADMISSION and DAILY: Assess and document risk factors for pressure ulcer development   TWICE DAILY: Assess and document skin integrity   TWICE DAILY: Assess and document dressing/incision, wound bed, drain sites and surrounding tissue     Problem: Skin/Tissue Integrity - Adult  Goal: Oral mucous membranes remain intact  Outcome: Progressing  Flowsheets (Taken 04/05/2023 0800)  Oral Mucous Membranes Remain Intact: Assess oral mucosa and hygiene practices     Problem: Musculoskeletal - Adult  Goal: Return mobility to safest level of function  Outcome: Progressing  Flowsheets (Taken 04/05/2023 0800)  Return Mobility to Safest Level of Function:   Assess patient stability and activity tolerance for standing, transferring and ambulating with or without assistive devices   Assist with transfers and ambulation using safe patient handling equipment as needed   Ensure adequate protection for wounds/incisions during mobilization   Obtain physical therapy/occupational therapy consults as needed   Apply continuous passive motion per provider or physical therapy orders to increase flexion toward goal   Instruct patient/family in ordered activity level     Problem: Musculoskeletal - Adult  Goal: Maintain proper alignment of affected body part  Outcome: Progressing  Flowsheets (Taken 04/05/2023 0800)  Maintain proper alignment of affected body part: Support and protect limb and body alignment per provider's orders     Problem:  Musculoskeletal - Adult  Goal: Return ADL status to a safe level of function  Outcome: Progressing  Flowsheets (Taken 04/05/2023 0800)  Return ADL Status to a Safe Level of Function:   Administer medication as ordered   Assess activities of daily living deficits and provide assistive devices as needed   Obtain physical therapy/occupational therapy consults as needed   Assist and instruct patient to increase activity and self care as tolerated     Problem: Genitourinary - Adult  Goal: Absence of urinary retention  Outcome: Progressing  Flowsheets (Taken 04/05/2023 0800)  Absence of urinary retention:   Assess patient's ability to void and empty bladder   Monitor intake/output and perform bladder scan as needed     Care plan reviewed with patient and family.  Patient and family verbalize understanding of the plan of care and contribute to goal setting.

## 2023-04-05 NOTE — Care Coordination-Inpatient (Addendum)
 04/05/23, 9:05 AM EDT    DISCHARGE PLANNING EVALUATION    Call from OT, reports she was working with pt this morning, reports pt now agreeable to SNF.     SW met with pt this morning. [facility]-acute [facility]) provider list was provided to patient. Patient was informed of their freedom to choose [facility] provider. Discussed and offered to show the patient the relevant [facility] Providers quality and resource use measures on Medicare Compare web site via computer based on patient's goals of care and treatment preferences. Questions regarding selection process were answered.  Patient reports preference for [facility], the [employer].     Call to [facility], they asked to have referral faxed to [phone]. SW faxed referral.    3:13 PM EDT  Call from nursing, reports pt now wanting to go to [facility]. SW met with pt, husband and daughter at bedside, pt confirms would like to go to [facility] [place] with [facility], referral made, they will review and get back with SW.

## 2023-04-05 NOTE — Progress Notes (Signed)
 2130 Report taken from nurse Mady G.    Head to Toe Assessment    [facility] Student Nurse  Head to Toe Assessment Performed at 0800  Skin  General skin appearance / Description: Pink, dry, and intact  Incisions / Wounds: Pacemaker placed on 3/11    Neuro/Head  LOC: A+O x 4.   Speech: Clear and responds to commands but slight lethargy with responses  Eyes PERRLA 3 mm  Symmetry of face / tongue: Symmetrical  JVD of neck: NA    Chest  Heart sounds / Apical Pulse: Strong and bounding. Pacemaker also   Dyspnea: NA  Lung sounds: Clear bilaterally  Cough: NA    Abdomen/ Pelvis  Bowel sounds: Active in all 4 quadrants  Passing flatus: Yes  Palpation / Inspection: No abdominal distention, no abdominal pain, abdomen non tender  Last BM: 3/11  Stool assessment: NA  Any GI issues: NA  Urinary issues: Bladder spasms   Continence: Yes  Urine color / turbidity: Yellow, clear    Extremities  Edema: NA  Altered Sensation: Sensation present  Upper extremity push / pulls: Right arm moderate strength, Left arm decreased strength due to sling and pacemaker implantation  Left Arm Radial Pulse: Bounding and strong   Left Upper extremity Capillary Refill: <3 seconds  Right Arm Radial Pulse: Bounding and strong   Right Upper extremity Capillary Refill: <3 seconds  Lower extremity pedal push / pulls: Bilaterally moderate strength push and pull  Left pedal pulse: Bounding and strong   Left posterior tibialis pulse: Bounding and strong   Left lower extremity capillary refill: <3 seconds  Right pedal pulse: Bounding and strong   Right posterior tibialis pulse: Bounding and strong    Right lower extremity capillary refill: < 3 seconds  Drift of extremities: NA  Pain: 0/10 through body but left arm is sore from pacemaker implantation. Patient states she does not want any pain meds.     Other issues: Patient going to skilled facility for rehab, social work looking for placement     1226 reassessment done and no changes to note  1400  Reported off to primary RN, Mady G.    [facility] / SN

## 2023-04-05 NOTE — Progress Notes (Signed)
 Hospitalist Progress Note  Internal Medicine Resident      Patient: Michelle Bright 73 y.o. female      Unit/Bed: 4K-04/004-A    Admit Date: 03/31/2023      ASSESSMENT AND PLAN  Active Problems  Symptomatic bradycardia (resolved): 1 week history of fatigue and palpitations. Reported as CHB at outside facility, placed on dobumatine and transferred to [facility]. On amiodarone/Toprol for pAF. EKG here showing sinus bradycardia with LBBB. Switched to epi gtt; off inotropic drips now. EP consulted; taken for PPM placement on 3/11. Per operative note; s/p successful dual-chamber PPM implantation. Heart rate currently 60s. Patient reports mild pain in left side of her chest. Per EP patient needs chest x-ray and electrocardiogram prior to discharge. Wound check in 7 to 10 days; device check in 3 months.   Chest x-ray and EKG prior to discharge  Currently on held Toprol 25 Mg p.o. daily and amiodarone 200 Mg p.o. daily  Monitor telemetry  Trend vitals  Per EP device check in 3 months  Paroxysmal atrial fibrillation: CHA2DS2-VASc 6. Rhythm controlled with amiodarone. Rate controlled with Toprol. Anticoagulated with warfarin. Has follow-up with Dr. Corrinne Eagle, electrophysiologist in the past. INR 1.61. Cardiology consulted; Coumadin restarted. Home atrial fibrillation medications on hold s/p PPM placement on 3/11.   Continue amiodarone 200 Mg p.o. daily  Continue Toprol 25 Mg p.o. daily  Continue Coumadin 1 Mg and 0.5 Mg p.o. daily for anticoagulation  Monitor telemetry  Prolonged QTc: Likely from chronic left bundle branch block. Continue telemetry. Avoiding QTc prolonging agents as possible.   Hypocalcemia, mild: check ical, replace if necessary   HFrEF, nonischemic cardiomyopathy, not in exacerbation: EF 35 to 40%.  LHC 11/2021 with patent coronaries.  GDMT: Toprol, Entresto, and [place]. Also on furosemide 20mg  daily. Not acutely volume overloaded. Not complaining manage shortness of breath. Monitor I's/O's and daily weights.  Cardiology consulted; home medications on held s/p PPM placement on 3/11.   Continue Toprol 25 Mg p.o. daily  Continue Aldactone 25 Mg p.o. daily  Continue Lasix 20 Mg p.o. daily  Continue holding Jardiance 10 Mg p.o. daily  CKD III: Cr 1.3 on admission. Cr baseline 1.0-1.2. Likely with some renal hypoperfusion in setting of bradycardia and hypotension. Held Lasix and Entresto initially while clinical status improved. Lasix restarted 3/10. Daily BMP.   Hypertension: 3/10 she was hypertensive; SBP in the 190s. Per cardiology had hydralazine as needed and amlodipine. Toprol and Entresto. Home medications restarted.  Continue amlodipine 5 Mg p.o. twice daily  Continue metoprolol 25 Mg p.o. daily  Continue Aldactone 25 Mg p.o. daily  Continue hydralazine 10 Mg IV every 6 hours as needed; SBP greater than 180/110  Subclinical hypothyroidism: S/p total thyroidectomy. Continue home levothyroxine. TSH elevated, T4 WNL       LDA: [] CVC / [] PICC / [] Midline / [] Foley / [] Drains / [] Mediport / [x] None  Antibiotics: Ancef  Steroids: N/A  Labs (still needed?): [] Yes / [x] No  IVF (still needed?): [] Yes / [x] No    Level of care: [x] Step Down / [] Med-Surg  Bed Status: [x] Inpatient / [] Observation  Telemetry: [x] Yes / [] No  PT/OT: [x] Yes / [] No    DVT Prophylaxis: []  Lovenox / []  Heparin / []  SCDs / [x]  Already on Systemic Anticoagulation / []  None     Expected discharge date:  N/A  Disposition: Pre-CERT for SNF  Code status: Full Code     ===================================================================    Chief Complaint: Fatigue, palpitations   Subjective (past 24 hours): Patient seen  and evaluated at bedside. A&Ox4. Pulse 60; saturating at 93% on room air. Per nursing no acute events overnight, patient was recommended for SNF. Patient reports doing well. Reports little pain in the left side of her chest; endorses no SOB. Reports sleeping well overnight. Patient reports eating and drinking normally reports normal urination and  bowel movements. Denies fever, nausea/vomiting, chest pain, SOB, constipation/diarrhea, abnormal urinary symptoms, headache, dizziness, fatigue, numbness/tingling.    HPI / Hospital Course:  73 year old female past medical history nonischemic cardiomyopathy, hypertension, hypothyroidism, paroxysmal atrial fibrillation presenting from outside hospital for symptomatic bradycardia.  Has been having fatigue and palpitations for approximately 1 week.  Found to have a significantly low heart rate with concern for complete heart block outside facility.  Was started on dobutamine and transferred to [facility] for further evaluation.  On arrival to [facility], patient was switched from dobutamine to dopamine.  Hemodynamically stable.  Answering questions appropriately.  Cardiology consulted for further evaluation.     Medications:    Infusion Medications    sodium chloride      sodium chloride 15 mL/hr at 04/04/23 1342    Scheduled Medications    venlafaxine  37.5 mg Oral Daily with breakfast    spironolactone  25 mg Oral Daily    amLODIPine  5 mg Oral BID    amiodarone  200 mg Oral Daily    metoprolol succinate  25 mg Oral Daily    ceFAZolin (ANCEF) 1,000 mg in sod chloride IRR soln 0.9 % 250 mL irrigation   Irrigation Once    sodium chloride flush  5-40 mL IntraVENous 2 times per day    lidocaine  1 patch TransDERmal Daily    furosemide  20 mg Oral Daily    sodium chloride flush  5-40 mL IntraVENous 2 times per day    levothyroxine  125 mcg Oral Daily    [Held by provider] empagliflozin  10 mg Oral Daily    PRN Meds: sodium chloride flush, sodium chloride, acetaminophen, hydrALAZINE, albuterol, oxyCODONE-acetaminophen, sodium chloride flush, sodium chloride, potassium chloride **OR** potassium chloride, magnesium sulfate, polyethylene glycol, [DISCONTINUED] acetaminophen **OR** acetaminophen, calcium gluconate, prochlorperazine    Exam:  BP (!) 143/63   Pulse 60   Temp 98.3 F (36.8 C) (Oral)   Resp 18   Ht 1.499 m (4' 11.02")    Wt 52.2 kg (115 lb 1.3 oz)   SpO2 93%   BMI 23.23 kg/m   General: No distress, appears stated age.  Eyes:  PERRL. Conjunctivae/corneas clear.  HENT: Head normal appearing. Nares normal. Oral mucosa moist.  Hearing intact.   Neck: Supple, with full range of motion. Trachea midline.  No gross JVD appreciated.  Respiratory:  Normal effort. Clear to auscultation, without rales or wheezes or rhonchi.  Cardiovascular: Normal rate, regular rhythm with normal S1/S2 without murmurs.    No lower extremity edema.   Abdomen: Soft, non-tender, non-distended with normal bowel sounds.  Musculoskeletal: No joint swelling or tenderness. Normal tone. No abnormal movements.   Skin: Warm and dry. No rashes or lesions.  Neurologic:  No focal sensory/motor deficits in the upper or lower extremities. Cranial nerves:  grossly non-focal 2-12.     Psychiatric: Alert and oriented, normal insight and thought content.   Capillary Refill: Brisk,< 3 seconds.  Peripheral Pulses: +2 palpable, equal bilaterally.       Labs/Radiology: See chart or assessment above.     Electronically signed by Maryjo Rochester, DO on 04/05/2023 at 4:16 PM  Case  was discussed with Attending, Dr. Zola Button.

## 2023-04-05 NOTE — Progress Notes (Signed)
 Warfarin Pharmacy Consult Note    Michelle Bright is a 73 y.o. female for whom pharmacy has been asked to manage warfarin therapy.     Consulting Provider: Lemar Lofty  Indication: Atrial fibrillation/Atrial flutter  Target INR: 2.0-3.0   Warfarin dose prior to admission: 1 mg all day, except for 0.5 mg on Thursdays  Outpatient warfarin provider: Dr. Arvil Chaco    Recent Labs     04/03/23  1601 04/04/23  0553 04/05/23  0325   HGB 12.7 11.4* 11.6*   PLT 208 181 183     Recent Labs     04/03/23  1133 04/03/23  1601 04/05/23  1634   INR 1.66* 1.61* 1.12     Concurrent anticoagulants/antiplatelets: none  Significant warfarin drug-drug interactions: amiodarone, levothyroxine    Date INR Warfarin Dose   04/05/23 1.12 1.5 mg                                   INR will be monitored routinely until therapeutic INR is achieved.    Pharmacy will continue to follow. Thank you for the consult,   Jerrell Mylar, PharmD, BCPS   04/05/2023  5:43 PM

## 2023-04-05 NOTE — Care Coordination-Inpatient (Signed)
 04/05/23, 2:34 PM EDT    DISCHARGE ON GOING EVALUATION    York Grice day: 5  Location: 4K-04/004-A Reason for admit: Bradycardia [R00.1]     Procedures:   3/11 PPM dual insertion.    Imaging since last note:   3/11 CXR: Status post pacemaker placement. No pneumothorax. Persistent diffuse prominence of the pulmonary interstitium which may represent mild pulmonary edema.      Barriers to Discharge: Hospitalist and cardiology following. IVP Lasix once today. Coumadin and aldactone both restarted by cardiology. Daily labs. Placement with precert as below.    PCP: Elton Sin, MD  Readmission Risk Score: 12.4    Patient Goals/Plan/Treatment Preferences: From home w husband and daughter. Had HH PTA. Plans dc to [facility]. See SW notes for referral updates.

## 2023-04-05 NOTE — Plan of Care (Signed)
 Problem: Chronic Conditions and Co-morbidities  Goal: Patient's chronic conditions and co-morbidity symptoms are monitored and maintained or improved  04/05/2023 2250 by Hubert Azure, RN  Outcome: Progressing  04/05/2023 1107 by Marlyn Corporal, RN  Outcome: Progressing  Flowsheets (Taken 04/05/2023 0800)  Care Plan - Patient's Chronic Conditions and Co-Morbidity Symptoms are Monitored and Maintained or Improved:   Monitor and assess patient's chronic conditions and comorbid symptoms for stability, deterioration, or improvement   Collaborate with multidisciplinary team to address chronic and comorbid conditions and prevent exacerbation or deterioration   Update acute care plan with appropriate goals if chronic or comorbid symptoms are exacerbated and prevent overall improvement and discharge     Problem: Discharge Planning  Goal: Discharge to home or other facility with appropriate resources  04/05/2023 2250 by Hubert Azure, RN  Outcome: Progressing  04/05/2023 1107 by Marlyn Corporal, RN  Outcome: Progressing  Flowsheets (Taken 04/05/2023 0800)  Discharge to home or other facility with appropriate resources:   Identify barriers to discharge with patient and caregiver   Arrange for needed discharge resources and transportation as appropriate   Identify discharge learning needs (meds, wound care, etc)     Problem: Safety - Adult  Goal: Free from fall injury  04/05/2023 2250 by Hubert Azure, RN  Outcome: Progressing  04/05/2023 1107 by Marlyn Corporal, RN  Outcome: Progressing     Problem: Pain  Goal: Verbalizes/displays adequate comfort level or baseline comfort level  04/05/2023 2250 by Hubert Azure, RN  Outcome: Progressing  04/05/2023 1107 by Marlyn Corporal, RN  Outcome: Progressing     Problem: Nutrition Deficit:  Goal: Optimize nutritional status  04/05/2023 2250 by Hubert Azure, RN  Outcome: Progressing  04/05/2023 1107 by Marlyn Corporal, RN  Outcome: Progressing     Problem: Cardiovascular -  Adult  Goal: Maintains optimal cardiac output and hemodynamic stability  04/05/2023 2250 by Hubert Azure, RN  Outcome: Progressing  04/05/2023 1107 by Marlyn Corporal, RN  Outcome: Progressing  Flowsheets (Taken 04/05/2023 0800)  Maintains optimal cardiac output and hemodynamic stability:   Monitor blood pressure and heart rate   Monitor urine output and notify Licensed Independent Practitioner for values outside of normal range   Assess for signs of decreased cardiac output  Goal: Absence of cardiac dysrhythmias or at baseline  04/05/2023 2250 by Hubert Azure, RN  Outcome: Progressing  04/05/2023 1107 by Marlyn Corporal, RN  Outcome: Progressing  Flowsheets (Taken 04/05/2023 0800)  Absence of cardiac dysrhythmias or at baseline:   Monitor cardiac rate and rhythm   Assess for signs of decreased cardiac output     Problem: Neurosensory - Adult  Goal: Achieves stable or improved neurological status  04/05/2023 2250 by Hubert Azure, RN  Outcome: Progressing  04/05/2023 1107 by Marlyn Corporal, RN  Outcome: Progressing  Flowsheets (Taken 04/05/2023 0800)  Achieves stable or improved neurological status:   Assess for and report changes in neurological status   Initiate measures to prevent increased intracranial pressure   Maintain blood pressure and fluid volume within ordered parameters to optimize cerebral perfusion and minimize risk of hemorrhage   Monitor temperature, glucose, and sodium. Initiate appropriate interventions as ordered  Goal: Achieves maximal functionality and self care  04/05/2023 2250 by Hubert Azure, RN  Outcome: Progressing  04/05/2023 1107 by Marlyn Corporal, RN  Outcome: Progressing  Flowsheets (Taken 04/05/2023 0800)  Achieves maximal functionality and self care: Monitor swallowing and airway patency with patient fatigue and  changes in neurological status     Problem: Respiratory - Adult  Goal: Achieves optimal ventilation and oxygenation  04/05/2023 2250 by Hubert Azure, RN  Outcome:  Progressing  04/05/2023 1107 by Marlyn Corporal, RN  Outcome: Progressing  Flowsheets (Taken 04/05/2023 0800)  Achieves optimal ventilation and oxygenation:   Assess for changes in respiratory status   Assess for changes in mentation and behavior   Position to facilitate oxygenation and minimize respiratory effort   Oxygen supplementation based on oxygen saturation or arterial blood gases   Encourage broncho-pulmonary hygiene including cough, deep breathe, incentive spirometry   Initiate smoking cessation protocol as indicated   Assess the need for suctioning and aspirate as needed   Assess and instruct to report shortness of breath or any respiratory difficulty   Respiratory therapy support as indicated     Problem: Skin/Tissue Integrity - Adult  Goal: Skin integrity remains intact  04/05/2023 2250 by Hubert Azure, RN  Outcome: Progressing  04/05/2023 1107 by Marlyn Corporal, RN  Outcome: Progressing  Flowsheets (Taken 04/05/2023 0800)  Skin Integrity Remains Intact:   Monitor for areas of redness and/or skin breakdown   Assess vascular access sites hourly  Goal: Incisions, wounds, or drain sites healing without S/S of infection  04/05/2023 2250 by Hubert Azure, RN  Outcome: Progressing  04/05/2023 1107 by Marlyn Corporal, RN  Outcome: Progressing  Flowsheets (Taken 04/05/2023 0800)  Incisions, Wounds, or Drain Sites Healing Without Sign and Symptoms of Infection:   ADMISSION and DAILY: Assess and document risk factors for pressure ulcer development   TWICE DAILY: Assess and document skin integrity   TWICE DAILY: Assess and document dressing/incision, wound bed, drain sites and surrounding tissue  Goal: Oral mucous membranes remain intact  04/05/2023 2250 by Hubert Azure, RN  Outcome: Progressing  04/05/2023 1107 by Marlyn Corporal, RN  Outcome: Progressing  Flowsheets (Taken 04/05/2023 0800)  Oral Mucous Membranes Remain Intact: Assess oral mucosa and hygiene practices     Problem: Musculoskeletal -  Adult  Goal: Return mobility to safest level of function  04/05/2023 2250 by Hubert Azure, RN  Outcome: Progressing  04/05/2023 1107 by Marlyn Corporal, RN  Outcome: Progressing  Flowsheets (Taken 04/05/2023 0800)  Return Mobility to Safest Level of Function:   Assess patient stability and activity tolerance for standing, transferring and ambulating with or without assistive devices   Assist with transfers and ambulation using safe patient handling equipment as needed   Ensure adequate protection for wounds/incisions during mobilization   Obtain physical therapy/occupational therapy consults as needed   Apply continuous passive motion per provider or physical therapy orders to increase flexion toward goal   Instruct patient/family in ordered activity level  Goal: Maintain proper alignment of affected body part  04/05/2023 2250 by Hubert Azure, RN  Outcome: Progressing  04/05/2023 1107 by Marlyn Corporal, RN  Outcome: Progressing  Flowsheets (Taken 04/05/2023 0800)  Maintain proper alignment of affected body part: Support and protect limb and body alignment per provider's orders  Goal: Return ADL status to a safe level of function  04/05/2023 2250 by Hubert Azure, RN  Outcome: Progressing  04/05/2023 1107 by Marlyn Corporal, RN  Outcome: Progressing  Flowsheets (Taken 04/05/2023 0800)  Return ADL Status to a Safe Level of Function:   Administer medication as ordered   Assess activities of daily living deficits and provide assistive devices as needed   Obtain physical therapy/occupational therapy consults as needed   Assist and instruct  patient to increase activity and self care as tolerated     Problem: Genitourinary - Adult  Goal: Absence of urinary retention  04/05/2023 2250 by Hubert Azure, RN  Outcome: Progressing  04/05/2023 1107 by Marlyn Corporal, RN  Outcome: Progressing  Flowsheets (Taken 04/05/2023 0800)  Absence of urinary retention:   Assess patient's ability to void and empty bladder   Monitor  intake/output and perform bladder scan as needed

## 2023-04-05 NOTE — Progress Notes (Signed)
 [facility]  STRZ ICU STEPDOWN TELEMETRY 4K  Occupational Therapy  Daily Note    Discharge Recommendations: Subacute/skilled nursing facility not safe at this time to return home d/t level of care required  Equipment Recommendations:   will continue to assess pending progress, discharge location    Time In: 0810  Time Out: 0850  Timed Code Treatment Minutes: 40 Minutes  Minutes: 40          Date: 04/05/2023  Patient Name: KHRISTIN KELEHER,   Gender: female      Room: 4K-04/004-A  MRN: [id number]  DOB: [birth year]  (73 y.o.)  Referring Practitioner: William Hamburger, Hoshimjon J, DO  Diagnosis: Bradycardia  Additional Pertinent Hx: Per H&P: 73 year old female past medical history nonischemic cardiomyopathy, hypertension, hypothyroidism, paroxysmal atrial fibrillation presenting from outside hospital for symptomatic bradycardia.  Has been having fatigue and palpitations for approximately 1 week.  Found to have a significantly low heart rate with concern for complete heart block outside facility.  Was started on dobutamine and transferred to [facility] for further evaluation.  On arrival to [facility], patient was switched from dobutamine to dopamine.    Restrictions/Precautions:  Restrictions/Precautions: Fall Risk  Position Activity Restriction  Other Position/Activity Restrictions: PPM pacemaker 3/11, sling 24hrs, do not elevate affected arm above shoulder for 6 weeks     Social/Functional History:  Lives With: Spouse, Daughter  Type of Home: House  Home Layout: One level  Home Access: Stairs to enter without rails  Entrance Stairs - Number of Steps: 3  Home Equipment: Designer, industrial/product, Wheelchair - Manual   Bathroom Shower/Tub: Medical sales representative: Handicap height  Bathroom Equipment: Grab bars in Neurosurgeon Help From: Home health [facility] RN prior)  Prior Level of Assist for ADLs: Needs assistance (pt reported normally tries to complete all ADLs on own, although frequently requires assist with toileting and  showering tasks)  Prior Level of Assist for Homemaking: Needs assistance  Prior Level of Assist for Transfers: Needs assistance (occasional assist)  Prior Level of Assist for Ambulation: Independent household ambulator, with or without device (always has assist/supervision with walking)    Active Driver: No  Patient's Driver Info: Husband and daughter     Additional Comments: Question accuracy of pt's reported PLOF. Pt reported primarily uses RW for all mobility, although later in session reported uses w/c primarily. Pt reported husband or daughter are home all the time with her    SUBJECTIVE: Patient laying in bed upon arrival; agreeable to therapy this date.  Nursing ok'd session as patient is out of 12hr bed rest order, patient to remain in sling for 24hrs following surgery.  Patient pleasant and cooperative, fearful of falling.  Patient insistent on returning home, increased time spent with education regarding current level of care required, LUE restrictions, and further therapy required prior to returning home as patient notes her husband and daughter could not provide the level of assistance she currently needs.    PAIN: no numerical scale provided however notes discomfort in LUE d/t previous fall    Vitals: Vitals not assessed per clinical judgement, see nursing flowsheet    COGNITION: Decreased Insight, Decreased Problem Solving, and Decreased Safety Awareness    ADL:   Feeding: Minimal Assistance.  Opening containers  Grooming: Minimal Assistance.  Patient able to complete brushing hair seated in w/c, requiring assistance for thoroughness of L side of hair d/t limited ROM with L shoulder  Upper Extremity Dressing: Maximum Assistance.  Sling management    BED MOBILITY:  Supine to Sit: Maximum Assistance, X 1, with head of bed raised, with rail, with verbal cues , with increased time for completion      TRANSFERS:  Sit to Stand:  Moderate Assistance, X 1, with increased time for completion, cues for hand  placement, with verbal cues, to/from chair without arms, decreased force production. Patient able to tolerate x ~64min static standing prior to returning to seated position EOB with R hand for support on bedrail  Stand to Sit: Contact Guard Assistance, X 1, with increased time for completion, cues for hand placement, with verbal cues, to/from chair without arms, cues for alignment to surface and for safety with assistive device.    Stand Pivot: Maximal Assistance x 1 EOB to w/c to R side, patient fearful    FUNCTIONAL MOBILITY:  Assistive Device: Wheelchair  Assist Level:  Dependent.   Distance: Within room  For positioning     ADDITIONAL ACTIVITIES:    Functional Outcome Measures:   AM-PAC Inpatient Daily Activity Raw Score: 13     ASSESSMENT:     Activity Tolerance:  Patient tolerance of  treatment: Good treatment tolerance       Plan: Times Per Week: 5x  Current Treatment Recommendations: Strengthening, ROM, Balance training, Functional mobility training, Endurance training, Safety education & training, Patient/Caregiver education & training, Equipment evaluation, education, & procurement, Self-Care / ADL    Education:  Learners: Patient  Role of OT, Plan of Care, ADL's, IADL's, Precautions, Home Safety, Importance of Increasing Activity, and Fall Prevention    Goals  Short Term Goals  Time Frame for Short Term Goals: by discharge  Short Term Goal 1: Pt will progress with completion of transfers/functional mobilityas able.  Short Term Goal 2: Pt will complete BADL tasks with minimal assistance to increase independence with self care tasks.  Short Term Goal 3: Pt will tolerate dynamic sitting at EOB X10 minutes with consistent SBA in prep for ADL completion.  Long Term Goals  Time Frame for Long Term Goals : not set due to ELOS  Collaboration with OTR regarding progressing transfer goal    Following session, patient left in safe position with all fall risk precautions in place.

## 2023-04-06 ENCOUNTER — Inpatient Hospital Stay: Admit: 2023-04-06 | Payer: Medicare (Managed Care) | Primary: Family Medicine

## 2023-04-06 LAB — BASIC METABOLIC PANEL
BUN: 40 mg/dL — ABNORMAL HIGH (ref 8–23)
CO2: 31 meq/L — ABNORMAL HIGH (ref 22–29)
Calcium: 8.9 mg/dL (ref 8.8–10.2)
Chloride: 91 meq/L — ABNORMAL LOW (ref 98–111)
Creatinine: 1 mg/dL — ABNORMAL HIGH (ref 0.5–0.9)
Glucose: 96 mg/dL (ref 74–109)
Potassium: 3.8 meq/L (ref 3.5–5.2)
Sodium: 136 meq/L (ref 135–145)

## 2023-04-06 LAB — CBC
Hematocrit: 35.7 % — ABNORMAL LOW (ref 37.0–47.0)
Hemoglobin: 11.2 g/dL — ABNORMAL LOW (ref 12.0–16.0)
MCH: 29.5 pg (ref 26.0–33.0)
MCHC: 31.4 g/dL — ABNORMAL LOW (ref 32.2–35.5)
MCV: 93.9 fL (ref 81.0–99.0)
MPV: 10.9 fL (ref 9.4–12.4)
Platelets: 195 10*3/uL (ref 130–400)
RBC: 3.8 10*6/uL — ABNORMAL LOW (ref 4.20–5.40)
RDW-CV: 15.8 % — ABNORMAL HIGH (ref 11.5–14.5)
RDW-SD: 54.4 fL — ABNORMAL HIGH (ref 35.0–45.0)
WBC: 10.3 10*3/uL (ref 4.8–10.8)

## 2023-04-06 LAB — PROTIME-INR: INR: 1.04 (ref 0.85–1.13)

## 2023-04-06 LAB — ANION GAP: Anion Gap: 14 meq/L (ref 8.0–16.0)

## 2023-04-06 LAB — GLOMERULAR FILTRATION RATE, ESTIMATED: Est, Glom Filt Rate: 59 mL/min/{1.73_m2} — AB (ref >60)

## 2023-04-06 MED ORDER — WARFARIN DAILY DOSING BY PROVIDER (PLACEHOLDER)
Status: DC
Start: 2023-04-06 — End: 2023-04-08

## 2023-04-06 MED ORDER — WARFARIN SODIUM 1 MG PO TABS
1 | Freq: Once | ORAL | Status: AC
Start: 2023-04-06 — End: 2023-04-06
  Administered 2023-04-06: 22:00:00 1.5 mg via ORAL

## 2023-04-06 MED ORDER — ONDANSETRON HCL 4 MG/2ML IJ SOLN
4 | Freq: Four times a day (QID) | INTRAMUSCULAR | Status: DC | PRN
Start: 2023-04-06 — End: 2023-04-06

## 2023-04-06 MED FILL — AMIODARONE HCL 200 MG PO TABS: 200 MG | ORAL | Qty: 1 | Fill #0

## 2023-04-06 MED FILL — SPIRONOLACTONE 25 MG PO TABS: 25 MG | ORAL | Qty: 1 | Fill #0

## 2023-04-06 MED FILL — ACETAMINOPHEN 325 MG PO TABS: 325 MG | ORAL | Qty: 2 | Fill #0

## 2023-04-06 MED FILL — COUMADIN 1 MG PO TABS: 1 MG | ORAL | Qty: 2 | Fill #0

## 2023-04-06 MED FILL — VENLAFAXINE HCL ER 37.5 MG PO CP24: 37.5 MG | ORAL | Qty: 1 | Fill #0

## 2023-04-06 MED FILL — SYNTHROID 125 MCG PO TABS: 125 MCG | ORAL | Qty: 1 | Fill #0

## 2023-04-06 MED FILL — FUROSEMIDE 20 MG PO TABS: 20 MG | ORAL | Qty: 1 | Fill #0

## 2023-04-06 MED FILL — AMLODIPINE BESYLATE 5 MG PO TABS: 5 MG | ORAL | Qty: 1 | Fill #0

## 2023-04-06 MED FILL — PROCHLORPERAZINE EDISYLATE 10 MG/2ML IJ SOLN: 10 MG/2ML | INTRAMUSCULAR | Qty: 2 | Fill #0

## 2023-04-06 MED FILL — METOPROLOL SUCCINATE ER 25 MG PO TB24: 25 MG | ORAL | Qty: 1 | Fill #0

## 2023-04-06 MED FILL — LIDOCARE ARM/NECK/LEG 4 % EX PTCH: 4 % | CUTANEOUS | Qty: 1 | Fill #0

## 2023-04-06 NOTE — Care Coordination-Inpatient (Signed)
 04/06/23, 3:39 PM EDT    DISCHARGE PLANNING EVALUATION    Spoke with Amil Amen at [facility], they will accept patient.  Precert started today 04/06/2023.

## 2023-04-06 NOTE — Plan of Care (Signed)
 Problem: Chronic Conditions and Co-morbidities  Goal: Patient's chronic conditions and co-morbidity symptoms are monitored and maintained or improved  04/06/2023 0946 by Ulyses Jarred, RN  Outcome: Progressing  Care Plan - Patient's Chronic Conditions and Co-Morbidity Symptoms are Monitored and Maintained or Improved:   Monitor and assess patient's chronic conditions and comorbid symptoms for stability, deterioration, or improvement   Collaborate with multidisciplinary team to address chronic and comorbid conditions and prevent exacerbation or deterioration   Update acute care plan with appropriate goals if chronic or comorbid symptoms are exacerbated and prevent overall improvement and discharge

## 2023-04-06 NOTE — Care Coordination-Inpatient (Signed)
 04/06/23, 11:30 AM EDT    DISCHARGE ON GOING EVALUATION    York Grice day: 6  Location: 8A-03/003-A Reason for admit: Bradycardia [R00.1]     Procedures: 3/11 PPM dual insertion.     Imaging since last note: no new    Barriers to Discharge: Hospitalist, Cardiology, and EP following.   Awaiting finalization of discharge plan.     PCP: Elton Sin, MD  Readmission Risk Score: 13    Patient Goals/Plan/Treatment Preferences: From home with husband and daughter. Was recently discharged from [facility].   See SW notes regarding SNF placement for rehab.

## 2023-04-06 NOTE — Progress Notes (Signed)
 Hospitalist Progress Note  Internal Medicine Resident      Patient: Michelle Bright 73 y.o. female      Unit/Bed: 8A-03/003-A    Admit Date: 03/31/2023      ASSESSMENT AND PLAN  Active Problems  Symptomatic bradycardia s/p PPM placement  3/11 (resolved): 1 week history of fatigue and palpitations. Reported as CHB at outside facility, placed on dobumatine and transferred to [facility]. On amiodarone/Toprol for pAF. EKG here showing sinus bradycardia with LBBB. Switched to epi gtt; off inotropic drips now. EP consulted; taken for PPM placement on 3/11. Per operative note; s/p successful dual-chamber PPM implantation. Heart rate currently 60s. Patient reports mild pain in left side of her chest. Per EP patient needs chest x-ray and electrocardiogram prior to discharge. Wound check in 7 to 10 days; device check in 3 months.   Chest x-ray and EKG prior to discharge  Currently on held Toprol 25 Mg p.o. daily and amiodarone 200 Mg p.o. daily  Monitor telemetry  Trend vitals  Per EP device check in 3 months - f/u with pacer clinic in 1 week  Paroxysmal atrial fibrillation: CHA2DS2-VASc 6. Rhythm controlled with amiodarone. Rate controlled with Toprol. Anticoagulated with warfarin. Has follow-up with Dr. Corrinne Eagle, electrophysiologist in the past. INR 1.61. Cardiology consulted; Coumadin restarted. Home atrial fibrillation medications on hold s/p PPM placement on 3/11.   Continue amiodarone 200 Mg p.o. daily  Continue Toprol 25 Mg p.o. daily  Continue Coumadin 1 Mg and 0.5 Mg p.o. daily for anticoagulation  Monitor telemetry  R wrist pain: sudden onset. R hand xray without fx, dislocation. Shows degenerative changes. Suspect OA. Pain control. PT/OT.  Urinary retention? Per pt, check bladder scan. She reports being unable to urinate since yesterday. Check UA. Straight cath PRN.   Prolonged QTc: Likely from chronic left bundle branch block. Continue telemetry. Avoiding QTc prolonging agents as possible.   Hypocalcemia, mild: check  ical, replace if necessary   HFrEF, nonischemic cardiomyopathy, not in exacerbation: EF 35 to 40%.  LHC 11/2021 with patent coronaries.  GDMT: Toprol, Entresto, and [place]. Also on furosemide 20mg  daily. Not acutely volume overloaded. Not complaining manage shortness of breath. Monitor I's/O's and daily weights. Cardiology consulted; home medications on held s/p PPM placement on 3/11.   Continue Toprol 25 Mg p.o. daily  Continue Aldactone 25 Mg p.o. daily  Continue Lasix 20 Mg p.o. daily  Continue holding Jardiance 10 Mg p.o. daily  CKD III: Cr 1.3 on admission. Cr baseline 1.0-1.2. Likely with some renal hypoperfusion in setting of bradycardia and hypotension. Held Lasix and Entresto initially while clinical status improved. Lasix restarted 3/10. Daily BMP.   Hypertension: 3/10 she was hypertensive; SBP in the 190s. Per cardiology had hydralazine as needed and amlodipine. Toprol and Entresto. Home medications restarted.  Continue amlodipine 5 Mg p.o. twice daily  Continue metoprolol 25 Mg p.o. daily  Continue Aldactone 25 Mg p.o. daily  Continue hydralazine 10 Mg IV every 6 hours as needed; SBP greater than 180/110  Subclinical hypothyroidism: S/p total thyroidectomy. Continue home levothyroxine. TSH elevated, T4 WNL       LDA: [] CVC / [] PICC / [] Midline / [] Foley / [] Drains / [] Mediport / [x] None  Antibiotics: Ancef  Steroids: N/A  Labs (still needed?): [] Yes / [x] No  IVF (still needed?): [] Yes / [x] No    Level of care: [x] Step Down / [] Med-Surg  Bed Status: [x] Inpatient / [] Observation  Telemetry: [x] Yes / [] No  PT/OT: [x] Yes / [] No    DVT Prophylaxis: []   Lovenox / []  Heparin / []  SCDs / [x]  Already on Systemic Anticoagulation / []  None     Expected discharge date:  N/A  Disposition: Pre-CERT for SNF  Code status: Full Code     ===================================================================    Chief Complaint: Fatigue, palpitations   Subjective (past 24 hours):   3/13: I assumed care today. Pt complains of  nausea and gen abd tenderness. Last BM yesterday evening. States she cannot urinate. Denies CP/palpitations/SOB.    HPI / Hospital Course:  73 year old female past medical history nonischemic cardiomyopathy, hypertension, hypothyroidism, paroxysmal atrial fibrillation presenting from outside hospital for symptomatic bradycardia.  Has been having fatigue and palpitations for approximately 1 week.  Found to have a significantly low heart rate with concern for complete heart block outside facility.  Was started on dobutamine and transferred to [facility] for further evaluation.  On arrival to [facility], patient was switched from dobutamine to dopamine.  Hemodynamically stable.  Answering questions appropriately.  Cardiology consulted for further evaluation.     "Patient seen and evaluated at bedside. A&Ox4. Pulse 60; saturating at 93% on room air. Per nursing no acute events overnight, patient was recommended for SNF. Patient reports doing well. Reports little pain in the left side of her chest; endorses no SOB. Reports sleeping well overnight. Patient reports eating and drinking normally reports normal urination and bowel movements. Denies fever, nausea/vomiting, chest pain, SOB, constipation/diarrhea, abnormal urinary symptoms, headache, dizziness, fatigue, numbness/tingling."    Medications:    Infusion Medications    sodium chloride 15 mL/hr at 04/04/23 1342    Scheduled Medications    warfarin  1.5 mg Oral Once    venlafaxine  37.5 mg Oral Daily with breakfast    spironolactone  25 mg Oral Daily    amLODIPine  5 mg Oral BID    amiodarone  200 mg Oral Daily    metoprolol succinate  25 mg Oral Daily    lidocaine  1 patch TransDERmal Daily    furosemide  20 mg Oral Daily    sodium chloride flush  5-40 mL IntraVENous 2 times per day    levothyroxine  125 mcg Oral Daily    [Held by provider] empagliflozin  10 mg Oral Daily    PRN Meds: acetaminophen, hydrALAZINE, albuterol, oxyCODONE-acetaminophen, sodium chloride flush, sodium  chloride, potassium chloride **OR** potassium chloride, magnesium sulfate, polyethylene glycol, [DISCONTINUED] acetaminophen **OR** acetaminophen, calcium gluconate, prochlorperazine    Exam:  BP (!) 130/47   Pulse 59   Temp 98.6 F (37 C) (Oral)   Resp 17   Ht 1.499 m (4' 11.02")   Wt 49.3 kg (108 lb 11 oz)   SpO2 90%   BMI 21.94 kg/m   General: No distress, appears stated age.  Eyes:  PERRL. Conjunctivae/corneas clear.  HENT: Head normal appearing. Nares normal. Oral mucosa moist.  Hearing intact.   Neck: Supple, with full range of motion. Trachea midline.  No gross JVD appreciated.  Respiratory:  Normal effort. Clear to auscultation, without rales or wheezes or rhonchi.  Cardiovascular: Normal rate, regular rhythm with normal S1/S2 without murmurs.    No lower extremity edema.   Abdomen: Soft, non-tender, non-distended with normal bowel sounds.  Musculoskeletal: No joint swelling or tenderness. Normal tone. No abnormal movements.   Skin: Warm and dry. No rashes or lesions.  Neurologic:  No focal sensory/motor deficits in the upper or lower extremities. Cranial nerves:  grossly non-focal 2-12.     Psychiatric: Alert and oriented, normal insight and thought  content.   Capillary Refill: Brisk,< 3 seconds.  Peripheral Pulses: +2 palpable, equal bilaterally.       Labs/Radiology: See chart or assessment above.     Electronically signed by Sheralyn Boatman, PA-C on 04/06/2023 at 2:33 PM  Case was discussed with Attending, Dr. Zola Button.

## 2023-04-06 NOTE — Progress Notes (Signed)
 Warfarin Pharmacy Consult Note    **Please contact pharmacist for discharge instructions**  Warfarin Indication: Atrial fibrillation/Atrial flutter  [facility] INR: 2.0-3.0  Dose prior to admission: 0.5mg  Thursdays, 1mg  all other days    Recent Labs     04/04/23  0553 04/05/23  0325 04/06/23  0525   HGB 11.4* 11.6* 11.2*   PLT 181 183 195     Recent Labs     04/03/23  1601 04/05/23  1634 04/06/23  0525   INR 1.61* 1.12 1.04     Concurrent anticoagulants/antiplatelets: none  Significant warfarin drug-drug interactions: amiodarone, levothyroxine     Date INR Warfarin Dose   04/05/23 1.12 1.5 mg   04/06/23  1.04  1.5 mg                                                Monitoring:                   INR will be monitored daily.    Delia Chimes R.Ph., BCPS., 04/06/2023,7:32 AM

## 2023-04-06 NOTE — Progress Notes (Signed)
 Cardiology Progress Note      Patient:  Michelle Bright  Date of Birth: 09-01-1950  MRN: [id number]   Acct: [id number]  Admit Date:  03/31/2023  Primary Cardiologist:  Dr Geradine Girt      Subjective (Events in last 24 hours):   Pt is nauseous today.  Some tenderness over the pacer site.      Objective:   BP (!) 130/47   Pulse 59   Temp 98.6 F (37 C) (Oral)   Resp 17   Ht 1.499 m (4' 11.02")   Wt 49.3 kg (108 lb 11 oz)   SpO2 90%   BMI 21.94 kg/m        All labs, EKG's, diagnostic testing and images as well as cardiac cath, stress testing were reviewed during this encounter   TELEMETRY: Huston Foley    Physical Exam:  General Appearance: alert and oriented to person, place and time, in no acute distress  Cardiovascular: normal rate, regular rhythm, normal S1 and S2, no murmurs, rubs, clicks, or gallops, distal pulses intact, no carotid bruits, no JVD  Pulmonary/Chest: clear to auscultation bilaterally- no wheezes, rales or rhonchi, normal air movement, no respiratory distress  Abdomen: soft, non-tender, non-distended, normal bowel sounds, no masses   Extremities: no cyanosis, clubbing or edema, pulse   Skin: warm and dry, no rash or erythema  Neurological: alert, oriented, normal speech, no focal findings or movement disorder noted  Pacer site: Dressing intact.  No hematoma    Medications:    warfarin  1.5 mg Oral Once    venlafaxine  37.5 mg Oral Daily with breakfast    spironolactone  25 mg Oral Daily    amLODIPine  5 mg Oral BID    amiodarone  200 mg Oral Daily    metoprolol succinate  25 mg Oral Daily    lidocaine  1 patch TransDERmal Daily    furosemide  20 mg Oral Daily    sodium chloride flush  5-40 mL IntraVENous 2 times per day    levothyroxine  125 mcg Oral Daily    [Held by provider] empagliflozin  10 mg Oral Daily      sodium chloride 15 mL/hr at 04/04/23 1342     acetaminophen, 650 mg, Q4H PRN  hydrALAZINE, 10 mg, Q6H PRN  albuterol, 2.5 mg, Q6H PRN  oxyCODONE-acetaminophen, 1 tablet, Q6H PRN  sodium  chloride flush, 5-40 mL, PRN  sodium chloride, , PRN  potassium chloride, 20 mEq, PRN   Or  potassium chloride, 10 mEq, PRN  magnesium sulfate, 2,000 mg, PRN  polyethylene glycol, 17 g, Daily PRN  acetaminophen, 650 mg, Q6H PRN  calcium gluconate, 2,000 mg, PRN  prochlorperazine, 10 mg, Q6H PRN        Diagnostics:  EKG:   Encounter Date: 03/31/23   EKG 12 Lead   Result Value    Ventricular Rate 57    Atrial Rate 57    P-R Interval 206    QRS Duration 140    Q-T Interval 504    QTc Calculation (Bazett) 490    P Axis 80    R Axis 19    T Axis 26    Narrative    Sinus bradycardia  Intraventricular conduction delay  Cannot rule out Septal infarct , age undetermined  Possible Lateral infarct , age undetermined  Abnormal ECG  When compared with ECG of 02-Apr-2023 06:44,  Nonspecific T wave abnormality now evident in Lateral leads  Confirmed by Levert Feinstein, AYYASH ([id number])  on 04/02/2023 6:03:47 PM          Lab Data:    Cardiac Enzymes:  No results for input(s): "CKTOTAL", "CKMB", "CKMBINDEX", "TROPONINI" in the last 72 hours.    CBC:   Lab Results   Component Value Date/Time    WBC 10.3 04/06/2023 05:25 AM    RBC 3.80 04/06/2023 05:25 AM    RBC 17 01/21/2020 09:08 AM    HGB 11.2 04/06/2023 05:25 AM    HCT 35.7 04/06/2023 05:25 AM    PLT 195 04/06/2023 05:25 AM       CMP:    Lab Results   Component Value Date/Time    NA 136 04/06/2023 05:25 AM    K 3.8 04/06/2023 05:25 AM    K 5.2 12/24/2022 03:27 AM    CL 91 04/06/2023 05:25 AM    CO2 31 04/06/2023 05:25 AM    BUN 40 04/06/2023 05:25 AM    CREATININE 1.0 04/06/2023 05:25 AM    LABGLOM 59 04/06/2023 05:25 AM    LABGLOM 86 04/13/2022 12:16 PM    GLUCOSE 96 04/06/2023 05:25 AM    CALCIUM 8.9 04/06/2023 05:25 AM       Hepatic Function Panel:    Lab Results   Component Value Date/Time    ALKPHOS 125 04/01/2023 08:17 AM    ALT 15 04/01/2023 08:17 AM    AST 33 04/01/2023 08:17 AM    BILITOT 0.3 04/01/2023 08:17 AM    BILITOT Negative 01/21/2020 09:08 AM    BILIDIR 0.2 04/01/2023 08:17 AM        Magnesium:    Lab Results   Component Value Date/Time    MG 2.1 04/02/2023 08:05 AM       PT/INR:    Lab Results   Component Value Date/Time    INR 1.04 04/06/2023 05:25 AM       HgBA1c:    Lab Results   Component Value Date/Time    LABA1C 5.6 12/28/2022 06:02 AM       FLP:    Lab Results   Component Value Date/Time    TRIG 107 04/11/2015 05:15 AM    HDL 64 04/11/2015 05:15 AM       TSH:    Lab Results   Component Value Date/Time    TSH 14.70 04/01/2023 03:27 AM         Assessment:  Pt staring off while walking - why she came to hospital   Hx CVA  ? 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 Tachy induced CDMP  Normal cath 11/2021        Ckd stage 3   Hx thyroidectomy     Plan:  Continue Amio, Amlodipine, Metoprolol, Aldactone and Coumadin  SGLT2 on  hold  F/U with pacer clinic in 1 week  F/U with Dr Geradine Girt in 2 weeks  Will see as needed         Electronically signed by Evette Cristal, PA-C on 04/06/2023 at 11:15 AM

## 2023-04-07 LAB — CBC
Hematocrit: 36.3 % — ABNORMAL LOW (ref 37.0–47.0)
Hemoglobin: 11.4 g/dL — ABNORMAL LOW (ref 12.0–16.0)
MCH: 29.5 pg (ref 26.0–33.0)
MCHC: 31.4 g/dL — ABNORMAL LOW (ref 32.2–35.5)
MCV: 93.8 fL (ref 81.0–99.0)
MPV: 10.2 fL (ref 9.4–12.4)
Platelets: 206 10*3/uL (ref 130–400)
RBC: 3.87 10*6/uL — ABNORMAL LOW (ref 4.20–5.40)
RDW-CV: 15.8 % — ABNORMAL HIGH (ref 11.5–14.5)
RDW-SD: 54.1 fL — ABNORMAL HIGH (ref 35.0–45.0)
WBC: 8.8 10*3/uL (ref 4.8–10.8)

## 2023-04-07 LAB — BASIC METABOLIC PANEL
BUN: 49 mg/dL — ABNORMAL HIGH (ref 8–23)
CO2: 32 meq/L — ABNORMAL HIGH (ref 22–29)
Calcium: 8.9 mg/dL (ref 8.8–10.2)
Chloride: 91 meq/L — ABNORMAL LOW (ref 98–111)
Creatinine: 1 mg/dL — ABNORMAL HIGH (ref 0.5–0.9)
Glucose: 92 mg/dL (ref 74–109)
Potassium: 4.1 meq/L (ref 3.5–5.2)
Sodium: 134 meq/L — ABNORMAL LOW (ref 135–145)

## 2023-04-07 LAB — ANION GAP: Anion Gap: 11 meq/L (ref 8.0–16.0)

## 2023-04-07 LAB — GLOMERULAR FILTRATION RATE, ESTIMATED: Est, Glom Filt Rate: 59 mL/min/{1.73_m2} — AB (ref >60)

## 2023-04-07 LAB — PROTIME-INR: INR: 1.1 (ref 0.85–1.13)

## 2023-04-07 MED ORDER — WARFARIN SODIUM 2 MG PO TABS
2 | Freq: Once | ORAL | Status: AC
Start: 2023-04-07 — End: 2023-04-07
  Administered 2023-04-07: 23:00:00 2 mg via ORAL

## 2023-04-07 MED FILL — COUMADIN 2 MG PO TABS: 2 MG | ORAL | Qty: 1 | Fill #0

## 2023-04-07 MED FILL — SPIRONOLACTONE 25 MG PO TABS: 25 MG | ORAL | Qty: 1 | Fill #0

## 2023-04-07 MED FILL — AMIODARONE HCL 200 MG PO TABS: 200 MG | ORAL | Qty: 1 | Fill #0

## 2023-04-07 MED FILL — AMLODIPINE BESYLATE 5 MG PO TABS: 5 MG | ORAL | Qty: 1 | Fill #0

## 2023-04-07 MED FILL — ACETAMINOPHEN 325 MG PO TABS: 325 MG | ORAL | Qty: 2 | Fill #0

## 2023-04-07 MED FILL — VENLAFAXINE HCL ER 37.5 MG PO CP24: 37.5 MG | ORAL | Qty: 1 | Fill #0

## 2023-04-07 MED FILL — SYNTHROID 125 MCG PO TABS: 125 MCG | ORAL | Qty: 1 | Fill #0

## 2023-04-07 MED FILL — LIDOCARE ARM/NECK/LEG 4 % EX PTCH: 4 % | CUTANEOUS | Qty: 1 | Fill #0

## 2023-04-07 MED FILL — METOPROLOL SUCCINATE ER 25 MG PO TB24: 25 MG | ORAL | Qty: 1 | Fill #0

## 2023-04-07 NOTE — Progress Notes (Signed)
 Spiritual Health History and Assessment/Progress Note  [facility]    Spiritual/Emotional Needs,  ,  ,      Name: KHAMIL LAMICA MRN: [id number]    Age: 73 y.o.     Sex: female   Language: English   Religion: Catholic   Bradycardia     Date: 04/07/2023            Total Time Calculated: (P) 9 min              Spiritual Assessment continued in [facility] 8AB        Referral/Consult From: Rounding   Encounter Overview/Reason: Spiritual/Emotional Needs  Service Provided For: Patient    Faith, Belief, Meaning:   Patient identifies as spiritual  Family/Friends identify as spiritual      Importance and Influence:  Patient has spiritual/personal beliefs that influence decisions regarding their health  Family/Friends have spiritual/personal beliefs that influence decisions regarding the patient's health    Community:  Patient is connected with a spiritual community  Family/Friends are connected with a spiritual community:    Assessment and Plan of Care:   In my encounter with the 73 yr old patient, while rounding  the unit 8A,  I provided spiritual care to patient through conversation, I also came to assess the patient's spiritual needs present. The pt was admitted due to bradycardia.     Patient Interventions include: Facilitated expression of thoughts and feelings  Family/Friends Interventions include: Facilitated expression of thoughts and feelings    Patient Plan of Care: Spiritual Care available upon further referral  Family/Friends Plan of Care: Spiritual Care available upon further referral    Electronically signed by Lawrence Santiago Zameria Vogl, [facility] on 04/07/2023 at 10:28 AM

## 2023-04-07 NOTE — Plan of Care (Signed)
 Problem: Chronic Conditions and Co-morbidities  Goal: Patient's chronic conditions and co-morbidity symptoms are monitored and maintained or improved  Outcome: Progressing     Problem: Discharge Planning  Goal: Discharge to home or other facility with appropriate resources  Outcome: Progressing     Problem: Safety - Adult  Goal: Free from fall injury  Outcome: Progressing     Problem: Pain  Goal: Verbalizes/displays adequate comfort level or baseline comfort level  Outcome: Progressing     Problem: Nutrition Deficit:  Goal: Optimize nutritional status  Outcome: Progressing     Problem: Cardiovascular - Adult  Goal: Maintains optimal cardiac output and hemodynamic stability  Outcome: Progressing  Goal: Absence of cardiac dysrhythmias or at baseline  Outcome: Progressing     Problem: Neurosensory - Adult  Goal: Achieves stable or improved neurological status  Outcome: Progressing  Goal: Achieves maximal functionality and self care  Outcome: Progressing     Problem: Respiratory - Adult  Goal: Achieves optimal ventilation and oxygenation  Outcome: Progressing     Problem: Skin/Tissue Integrity - Adult  Goal: Skin integrity remains intact  Description: 1.  Monitor for areas of redness and/or skin breakdown  2.  Assess vascular access sites hourly  3.  Every 4-6 hours minimum:  Change oxygen saturation probe site  4.  Every 4-6 hours:  If on nasal continuous positive airway pressure, respiratory therapy assess nares and determine need for appliance change or resting period  Outcome: Progressing  Goal: Incisions, wounds, or drain sites healing without S/S of infection  Outcome: Progressing  Goal: Oral mucous membranes remain intact  Outcome: Progressing     Problem: Musculoskeletal - Adult  Goal: Return mobility to safest level of function  Outcome: Progressing  Goal: Maintain proper alignment of affected body part  Outcome: Progressing  Goal: Return ADL status to a safe level of function  Outcome: Progressing      Problem: Genitourinary - Adult  Goal: Absence of urinary retention  Outcome: Progressing     Problem: Skin/Tissue Integrity  Goal: Skin integrity remains intact  Description: 1.  Monitor for areas of redness and/or skin breakdown  2.  Assess vascular access sites hourly  3.  Every 4-6 hours minimum:  Change oxygen saturation probe site  4.  Every 4-6 hours:  If on nasal continuous positive airway pressure, respiratory therapy assess nares and determine need for appliance change or resting period  Outcome: Progressing

## 2023-04-07 NOTE — Plan of Care (Signed)
 Problem: Chronic Conditions and Co-morbidities  Goal: Patient's chronic conditions and co-morbidity symptoms are monitored and maintained or improved  04/07/2023 0958 by Ulyses Jarred, RN  Outcome: Adequate for Discharge  Flowsheets (Taken 04/07/2023 0956)  Care Plan - Patient's Chronic Conditions and Co-Morbidity Symptoms are Monitored and Maintained or Improved:   Monitor and assess patient's chronic conditions and comorbid symptoms for stability, deterioration, or improvement   Collaborate with multidisciplinary team to address chronic and comorbid conditions and prevent exacerbation or deterioration   Update acute care plan with appropriate goals if chronic or comorbid symptoms are exacerbated and prevent overall improvement and discharge     Problem: Discharge Planning  Goal: Discharge to home or other facility with appropriate resources  04/07/2023 0958 by Ulyses Jarred, RN  Outcome: Adequate for Discharge  Flowsheets (Taken 04/07/2023 [phone])  Discharge to home or other facility with appropriate resources:   Identify barriers to discharge with patient and caregiver   Identify discharge learning needs (meds, wound care, etc)   Arrange for needed discharge resources and transportation as appropriate   Arrange for interpreters to assist at discharge as needed   Refer to discharge planning if patient needs post-hospital services based on physician order or complex needs related to functional status, cognitive ability or social support system

## 2023-04-07 NOTE — Progress Notes (Signed)
 [facility]  [facility] MED SURG 8AB  Occupational Therapy  Daily Note    Discharge Recommendations: ECF with OT and 24 hour assistance or supervision  Equipment Recommendations:   will continue to assess pending progress, discharge location      Time In: 1034  Time Out: 1107  Timed Code Treatment Minutes: 33 Minutes  Minutes: 33          Date: 04/07/2023  Patient Name: Michelle Bright,   Gender: female      Room: 8A-03/003-A  MRN: [id number]  DOB: Apr 13, 1950  (72 y.o.)  Referring Practitioner: William Hamburger, Hoshimjon J, DO  Diagnosis: Bradycardia  Additional Pertinent Hx: Per H&P: 73 year old female past medical history nonischemic cardiomyopathy, hypertension, hypothyroidism, paroxysmal atrial fibrillation presenting from outside hospital for symptomatic bradycardia.  Has been having fatigue and palpitations for approximately 1 week.  Found to have a significantly low heart rate with concern for complete heart block outside facility.  Was started on dobutamine and transferred to [facility] for further evaluation.  On arrival to [facility], patient was switched from dobutamine to dopamine.    Restrictions/Precautions:  Restrictions/Precautions: Fall Risk  Position Activity Restriction  Other Position/Activity Restrictions: PPM pacemaker 3/11, sling 24hrs, do not elevate affected arm above shoulder for 6 weeks     Social/Functional History:  Lives With: Spouse, Daughter  Type of Home: House  Home Layout: One level  Home Access: Stairs to enter without rails  Entrance Stairs - Number of Steps: 3  Home Equipment: Designer, industrial/product, Wheelchair - Manual   Bathroom Shower/Tub: Medical sales representative: Handicap height  Bathroom Equipment: Grab bars in Neurosurgeon Help From: Home health [facility] RN prior)  Prior Level of Assist for ADLs: Needs assistance (pt reported normally tries to complete all ADLs on own, although frequently requires assist with toileting and showering tasks)  Prior Level of Assist for Homemaking:  Needs assistance  Prior Level of Assist for Transfers: Needs assistance (occasional assist)  Prior Level of Assist for Ambulation: Independent household ambulator, with or without device (always has assist/supervision with walking)    Active Driver: No  Patient's Driver Info: Husband and daughter     Additional Comments: Question accuracy of pt's reported PLOF. Pt reported primarily uses RW for all mobility, although later in session reported uses w/c primarily. Pt reported husband or daughter are home all the time with her    SUBJECTIVE: Nurse Leah ok'd session, In bed upon arrival, agreeable to OT session, cooperative and anxious    PAIN: 0/10:     Vitals: Vitals not assessed per clinical judgement, see nursing flowsheet    COGNITION: Slow Processing, Decreased Insight, Impaired Memory, and Decreased Problem Solving    ADL:   Grooming: Moderate Assistance.  A for thoroughness, combed hair sitting on BSC  Toileting: Maximum Assistance.  Dep for clothing management  Toilet Transfer: Maximum Assistance. BSC .    BED MOBILITY:  Supine to Sit: Maximum Assistance HOB elevated,used bedrail    TRANSFERS:  Sit to Stand:  Maximum Assistance. EOB  Stand to Sit: Maximum Assistance. Bedside chair  Stand Pivot: Maximum Assistance. EOB>BSC>bedside chair        Modified Rankin:  Current Functional Status:  Not Applicable    ASSESSMENT:     Activity Tolerance:  Patient tolerance of  treatment: Good treatment tolerance      Plan: Times Per Week: 5x  Current Treatment Recommendations: Strengthening, ROM, Balance training, Functional mobility training, Endurance training,  Safety education & training, Patient/Caregiver education & training, Equipment evaluation, education, & procurement, Self-Care / ADL    Education:  Learners: Patient  Fall Prevention    Goals  Short Term Goals  Time Frame for Short Term Goals: by discharge  Short Term Goal 1: Pt will complete sit<>stand and stand pivot transfers with minimal assistance X1 and <2  cues for pacemaker precautions in prep for BSC use.  Short Term Goal 2: Pt will complete BADL tasks with minimal assistance to increase independence with self care tasks.  Short Term Goal 3: Pt will tolerate dynamic sitting at EOB X10 minutes with consistent SBA in prep for ADL completion.  Long Term Goals  Time Frame for Long Term Goals : not set due to ELOS    Following session, patient left in safe position with all fall risk precautions in place.

## 2023-04-07 NOTE — Progress Notes (Signed)
 Warfarin Pharmacy Consult Note    **Please contact pharmacist for discharge instructions**  Warfarin Indication: Atrial fibrillation/Atrial flutter  Target INR: 2.0-3.0  Dose prior to admission: 0.5mg  Thursdays, 1mg  all other days    Recent Labs     04/05/23  0325 04/06/23  0525 04/07/23  0529   HGB 11.6* 11.2* 11.4*   PLT 183 195 206     Recent Labs     04/05/23  1634 04/06/23  0525 04/07/23  0529   INR 1.12 1.04 1.10     Concurrent anticoagulants/antiplatelets: none  Significant warfarin drug-drug interactions: amiodarone, levothyroxine     Date INR Warfarin Dose   04/05/23 1.12 1.5 mg   04/06/23  1.04  1.5 mg    04/07/23  1.10  2 mg                                      Monitoring:                   INR will be monitored daily.    Delia Chimes R.Ph., BCPS., 04/07/2023,7:01 AM

## 2023-04-07 NOTE — Progress Notes (Signed)
 Comprehensive Nutrition Assessment    Type and Reason for Visit:  Reassess, Wound    Nutrition Recommendations/Plan:   Consider liberalizing diet if po intake continues to be poor.  Send vanilla Mighty Shake TID per pt request.  Consider MVI as appropriate.  Consider appetite stimulant as appropriate.      Malnutrition Assessment:  Malnutrition Status:  Moderate malnutrition (04/07/23 1130)    Context:  Acute Illness     Findings of the 6 clinical characteristics of malnutrition:  Energy Intake:  75% or less of estimated energy requirements for 7 or more days  Weight Loss:  Unable to assess (HF pt ( pt weighs 10# more than she did 10/13/22))     Body Fat Loss:  No body fat loss     Muscle Mass Loss:  Mild muscle mass loss Temples (temporalis)  Fluid Accumulation:  Mild Extremities  Grip Strength:  Not Performed    Nutrition Assessment:      Pt. nutritionally not improving from a nutritional standpoint  AEB intake less than 75% overall LOS day 7. At risk for further nutrition compromise r/t admit with Bradycardia, Paroxysmal Atrial Fib, Hypocalcemia, HF, CKD III, HTN, increased needs for wound healing and underlying medical condition (Atrial Fib, CVA, CAD, HTN, Osteoporosis, Thyroid Disease).     Nutrition Related Findings:    Pt. Report/Treatments/Miscellaneous: Pt seen, appetite marginal, appears to have consumed ~ 50% at breakfast per tray observation. Pt requested vanilla mighty shake. She denies having difficulty chewing/swallowing foods on current diet.    GI Status: BM x 2 (3/9)  Pertinent Labs: (3/14) Na 134, BUN 49, Creatinine 1, Hemoglobin 11.4,   Pertinent Meds: Lasix, Synthroid, Coumadin     Wound Type:  (ankle distal; left posterior, coccyx)       Current Nutrition Intake & Therapies:    Average Meal Intake: 26-50%  Average Supplements Intake: None Ordered  ADULT DIET; Regular; Low Fat/Low Chol/High Fiber/2 gm Na  ADULT ORAL NUTRITION SUPPLEMENT; Breakfast, Lunch, Dinner; Standard 4 oz Oral  Supplement    Anthropometric Measures:  Height: 149.9 cm (4' 11.02")  Ideal Body Weight (IBW): 95 lbs (43 kg)    Admission Body Weight: 48 kg (105 lb 13.1 oz) ((3/7) trace RLE & LLE edema bedscale)  Current Body Weight: 49.3 kg (108 lb 11 oz) ((3/13) trace RLE & LEL edema bedscale), 116.5 % IBW.    Current BMI (kg/m2): 21.9  Usual Body Weight:  (per EMR: (12/30/22) 153# 14oz bedscale, (10/13/22) 100# 11oz, (07/05/22) 91# 8oz, (01/05/22) 90# 13oz actual)                          BMI Categories: Underweight (BMI less than 22) age over 36    Estimated Daily Nutrient Needs:  Energy Requirements Based On: Kcal/kg  Weight Used for Energy Requirements:  (49.3kgm (3/13))  Energy (kcal/day): 4401-0272 (25-30)  Weight Used for Protein Requirements:  ((3/13) 49.3kgm)  Protein (g/day): 49+ grams as renal function tolerates for wound healing ( 1+ grams protein/kgm)       Nutrition Diagnosis:   Moderate malnutrition, in context of acute illness or injury related to inadequate protein-energy intake as evidenced by criteria as identified in malnutrition assessment    Nutrition Interventions:   Food and/or Nutrient Delivery: Continue Current Diet, Start Oral Nutrition Supplement  Nutrition Education/Counseling: Education/Counseling initiated (Encouraged good oral intake & ONS intake best effort)  Coordination of Nutrition Care: Continue to monitor while inpatient, Speech Therapy,  Swallow Evaluation       Goals:  Goals: PO intake 75% or greater, by next RD assessment  Type of Goal: Continue current goal  Previous Goal Met: No Progress toward Goal(s)    Nutrition Monitoring and Evaluation:      Food/Nutrient Intake Outcomes: Diet Advancement/Tolerance, Food and Nutrient Intake, Supplement Intake, Vitamin/Mineral Intake  Physical Signs/Symptoms Outcomes: Biochemical Data, Chewing or Swallowing, GI Status, Fluid Status or Edema, Hemodynamic Status, Nutrition Focused Physical Findings, Skin, Weight    Discharge Planning:    Too soon to  determine     Fernande Boyden, RD, LD  Contact: [phone]

## 2023-04-07 NOTE — Progress Notes (Signed)
 Hospitalist Progress Note  Internal Medicine Resident      Patient: Michelle Bright 73 y.o. female      Unit/Bed: 8A-03/003-A    Admit Date: 03/31/2023      ASSESSMENT AND PLAN  Active Problems  Symptomatic bradycardia s/p PPM placement  3/11 (resolved): 1 week history of fatigue and palpitations. Reported as CHB at outside facility, placed on dobumatine and transferred to [facility]. On amiodarone/Toprol for pAF. EKG here showing sinus bradycardia with LBBB. Switched to epi gtt; off inotropic drips now. EP consulted; taken for PPM placement on 3/11. Per operative note; s/p successful dual-chamber PPM implantation. Heart rate currently 60s. Patient reports mild pain in left side of her chest. Per EP patient needs chest x-ray and electrocardiogram prior to discharge. Wound check in 7 to 10 days; device check in 3 months.   Chest x-ray and EKG prior to discharge  Currently on held Toprol 25 Mg p.o. daily and amiodarone 200 Mg p.o. daily  Monitor telemetry  Trend vitals  Per EP device check in 3 months - f/u with pacer clinic in 1 week  Paroxysmal atrial fibrillation: CHA2DS2-VASc 6. Rhythm controlled with amiodarone. Rate controlled with Toprol. Anticoagulated with warfarin. Has follow-up with Dr. Corrinne Eagle, electrophysiologist in the past. INR 1.61. Cardiology consulted; Coumadin restarted. Home atrial fibrillation medications on hold s/p PPM placement on 3/11.   Continue amiodarone 200 Mg p.o. daily  Continue Toprol 25 Mg p.o. daily  Continue Coumadin 1 Mg and 0.5 Mg p.o. daily for anticoagulation  Monitor telemetry  R wrist pain, improving: sudden onset. R hand xray without fx, dislocation. Shows degenerative changes. Suspect OA. Pain control. PT/OT.  Urinary retention?, ruled outPer pt, check bladder scan. She reports being unable to urinate since yesterday. Check UA. Straight cath PRN.   Discussed with RN who reported pt had a full brief of urine and straight cath thereafter had only ~ 20 mL   Prolonged QTc: Likely from  chronic left bundle branch block. Continue telemetry. Avoiding QTc prolonging agents as possible.   Hypocalcemia, mild: check ical, replace if necessary   HFrEF, nonischemic cardiomyopathy, not in exacerbation: EF 35 to 40%.  LHC 11/2021 with patent coronaries.  GDMT: Toprol, Entresto, and [place]. Also on furosemide 20mg  daily. Not acutely volume overloaded. Not complaining manage shortness of breath. Monitor I's/O's and daily weights. Cardiology consulted; home medications on held s/p PPM placement on 3/11.   Continue Toprol 25 Mg p.o. daily  Continue Aldactone 25 Mg p.o. daily  Continue Lasix 20 Mg p.o. daily  Continue holding Jardiance 10 Mg p.o. daily  CKD III: Cr 1.3 on admission. Cr baseline 1.0-1.2, currently at baseline.  Likely with some renal hypoperfusion in setting of bradycardia and hypotension. Held Lasix and Entresto initially while clinical status improved. Lasix restarted 3/10. Daily BMP.   Accelerated hypertension, resolved: 3/10 she was hypertensive; SBP in the 190s. Per cardiology had hydralazine as needed and amlodipine. Toprol and Entresto. Home medications restarted.  Continue amlodipine 5 Mg p.o. twice daily  Continue metoprolol 25 Mg p.o. daily  Continue Aldactone 25 Mg p.o. daily  Continue hydralazine 10 Mg IV every 6 hours as needed; SBP greater than 180/110  Subclinical hypothyroidism: S/p total thyroidectomy. Continue home levothyroxine. TSH elevated, T4 WNL       LDA: [] CVC / [] PICC / [] Midline / [] Foley / [] Drains / [] Mediport / [x] None  Antibiotics: Ancef  Steroids: N/A  Labs (still needed?): [] Yes / [x] No  IVF (still needed?): [] Yes / [x] No  Level of care: [x] Step Down / [] Med-Surg  Bed Status: [x] Inpatient / [] Observation  Telemetry: [x] Yes / [] No  PT/OT: [x] Yes / [] No    DVT Prophylaxis: []  Lovenox / []  Heparin / []  SCDs / [x]  Already on Systemic Anticoagulation / []  None     Expected discharge date: Tomorrow  Disposition: Pre-CERT for SNF  Code status: Full Code      ===================================================================    Chief Complaint: Fatigue, palpitations   Subjective (past 24 hours):   3/14: Patient is sitting up in the chair.  She reports that her right wrist pain is "much better ".  She also denies any abdominal pain or nausea today.  Denies chest pain, denies shortness of breath.  Planning discharge tomorrow to SNF.     HPI / Hospital Course:  73 year old female past medical history nonischemic cardiomyopathy, hypertension, hypothyroidism, paroxysmal atrial fibrillation presenting from outside hospital for symptomatic bradycardia.  Has been having fatigue and palpitations for approximately 1 week.  Found to have a significantly low heart rate with concern for complete heart block outside facility.  Was started on dobutamine and transferred to [facility] for further evaluation.  On arrival to [facility], patient was switched from dobutamine to dopamine.  Hemodynamically stable.  Answering questions appropriately.  Cardiology consulted for further evaluation.     "Patient seen and evaluated at bedside. A&Ox4. Pulse 60; saturating at 93% on room air. Per nursing no acute events overnight, patient was recommended for SNF. Patient reports doing well. Reports little pain in the left side of her chest; endorses no SOB. Reports sleeping well overnight. Patient reports eating and drinking normally reports normal urination and bowel movements. Denies fever, nausea/vomiting, chest pain, SOB, constipation/diarrhea, abnormal urinary symptoms, headache, dizziness, fatigue, numbness/tingling."    3/13: I assumed care today. Pt complains of nausea and gen abd tenderness. Last BM yesterday evening. States she cannot urinate. Denies CP/palpitations/SOB.    Medications:    Infusion Medications    sodium chloride 15 mL/hr at 04/04/23 1342    Scheduled Medications    warfarin  2 mg Oral Once    warfarin placeholder: dosing by provider   Oral RX Placeholder    venlafaxine  37.5 mg  Oral Daily with breakfast    spironolactone  25 mg Oral Daily    amLODIPine  5 mg Oral BID    amiodarone  200 mg Oral Daily    metoprolol succinate  25 mg Oral Daily    lidocaine  1 patch TransDERmal Daily    furosemide  20 mg Oral Daily    sodium chloride flush  5-40 mL IntraVENous 2 times per day    levothyroxine  125 mcg Oral Daily    [Held by provider] empagliflozin  10 mg Oral Daily    PRN Meds: acetaminophen, hydrALAZINE, albuterol, oxyCODONE-acetaminophen, sodium chloride flush, sodium chloride, potassium chloride **OR** potassium chloride, magnesium sulfate, polyethylene glycol, [DISCONTINUED] acetaminophen **OR** acetaminophen, calcium gluconate, prochlorperazine    Exam:  BP (!) 124/50   Pulse 60   Temp 98.1 F (36.7 C) (Oral)   Resp 16   Ht 1.499 m (4' 11.02")   Wt 49.3 kg (108 lb 11 oz)   SpO2 94%   BMI 21.94 kg/m   General: No distress, appears stated age.  Eyes:  PERRL. Conjunctivae/corneas clear.  HENT: Head normal appearing. Nares normal. Oral mucosa moist.  Hearing intact.   Neck: Supple, with full range of motion. Trachea midline.  No gross JVD appreciated.  Respiratory:  Normal effort. Clear  to auscultation, without rales or wheezes or rhonchi.  Cardiovascular: Normal rate, regular rhythm with normal S1/S2 without murmurs.    No lower extremity edema.   Abdomen: Soft, non-tender, non-distended with normal bowel sounds.  Musculoskeletal: No joint swelling or tenderness. Normal tone. No abnormal movements.   Skin: Warm and dry. No rashes or lesions.  Neurologic:  No focal sensory/motor deficits in the upper or lower extremities. Cranial nerves:  grossly non-focal 2-12.     Psychiatric: Alert and oriented, normal insight and thought content.   Capillary Refill: Brisk,< 3 seconds.  Peripheral Pulses: +2 palpable, equal bilaterally.       Labs/Radiology: See chart or assessment above.     Electronically signed by Sheralyn Boatman, PA-C on 04/07/2023 at 9:31 AM  Case was discussed with Attending,  Dr. Zola Button.

## 2023-04-07 NOTE — Discharge Instructions (Addendum)
 Continuity of Care Form    Patient Name: Michelle Bright   DOB:  1950/03/13  MRN:  [id number]    Admit date:  03/31/2023  Discharge date:  04/08/2023    Code Status Order: Full Code   Advance Directives:     Admitting Physician:  Mikel Cella, MD  PCP: Elton Sin, MD    Discharging Nurse: Ulyses Jarred, RN  Discharging Hospital Unit/Room#: 8A-03/003-A  Discharging Unit Phone Number: [phone]    Emergency Contact:   Extended Emergency Contact Information  Primary Emergency Contact: Cranmore,Rocky  Address: [address]           [place], [place] [address] [place]  Home Phone: [phone]  Relation: Spouse  Secondary Emergency Contact: Sherre [place]  Home Phone: [phone]  Relation: Child    Past Surgical History:  Past Surgical History:   Procedure Laterality Date    BLADDER SURGERY  03/11/2021    pessary    CARDIAC PROCEDURE Left 12/22/2021    Left heart cath / coronary angiography performed by Archie Balboa, MD at [facility] CARDIAC CATH LAB    FEMUR SURGERY  12/2014    left femur    FRACTURE SURGERY      SPINE SURGERY N/A 12/26/2022    KYPHOPLASTY S1-2 + T5 performed by Reyne Dumas, MD at [facility] OR    STIMULATOR SURGERY N/A 02/11/2020    STAGE 1 INTERSTIM performed by Helane Rima, MD at [facility] OR    STIMULATOR SURGERY N/A 02/25/2020    STAGE 2 STIMULATOR INSERTION performed by Helane Rima, MD at [facility] OR    THYROID SURGERY  2005    TRANSESOPHAGEAL ECHOCARDIOGRAM N/A 01/05/2022    TRANSESOPHAGEAL ECHOCARDIOGRAM performed by Archie Balboa, MD at [facility]       Immunization History:   Immunization History   Administered Date(s) Administered    COVID-19, MODERNA BLUE border, Primary or Immunocompromised, (age 12y+), IM, 100 mcg/0.15mL 03/01/2019, 03/29/2019, 05/14/2020    COVID-19, MODERNA Bivalent, (age 12y+), IM, 50 mcg/0.5 mL 11/26/2020    COVID-19, MODERNA Booster BLUE border, (age 18y+), IM, 13mcg/0.25mL 11/27/2019    COVID-19, MODERNA, 2024/25, (age  12y+), IM, 61mcg/0.5mL 11/17/2021    Influenza Vaccine, unspecified formulation 11/09/2014    Influenza, FLUZONE High Dose (age 71 y+), IM, Quadv, 0.38mL 10/30/2018, 11/12/2019, 10/26/2020, 11/05/2021    Influenza, FLUZONE High Dose, (age 57 y+), IM, Trivalent PF, 0.71mL 11/12/2019    Pneumococcal, PCV-13, PREVNAR 13, (age 6w+), IM, 0.66mL 09/09/2014    Pneumococcal, PCV20, PREVNAR 20, (age 6w+), IM, 0.44mL 11/05/2021    RSV, ABRYSVO, (Pregnant or age 59y+), PF, IM, 0.50mL 10/25/2021    Zoster Recombinant (Shingrix) 10/25/2021, 01/10/2022       Active Problems:  Patient Active Problem List   Diagnosis Code    Paroxysmal atrial fibrillation (HCC) I48.0    HFrEF (heart failure with reduced ejection fraction) (HCC) I50.20    AKI (acute kidney injury) N17.9    Sacral fracture, closed (HCC) S32.10XA    Hyperkalemia E87.5    Compression fracture of T3 vertebra (HCC) S22.030A    Acute cystitis with hematuria N30.01    Supratherapeutic INR R79.1    Chronic HFrEF (heart failure with reduced ejection fraction) (HCC) I50.22    Bradycardia R00.1    Physical deconditioning R53.81    Ischemic cardiomyopathy I25.5    Moderate mitral regurgitation I34.0    Moderate tricuspid regurgitation I07.1    Heart failure with reduced  ejection fraction (HFrEF, <= 40%) (HCC) I50.20    Symptomatic bradycardia R00.1    Family history of CVA Z82.3    Uncontrolled hypertension I10    Moderate malnutrition E44.0       Isolation/Infection:   Isolation            No Isolation          Patient Infection Status    None to display         Nurse Assessment:  Last Vital Signs: BP (!) 128/47   Pulse 60   Temp 98.2 F (36.8 C) (Oral)   Resp 16   Ht 1.499 m (4' 11.02")   Wt 49.3 kg (108 lb 11 oz)   SpO2 94%   BMI 21.94 kg/m     Last documented pain score (0-10 scale): Pain Level: 1  Last Weight:   Wt Readings from Last 1 Encounters:   04/06/23 49.3 kg (108 lb 11 oz)     Mental Status:  oriented and alert    IV Access:  - None    Nursing  Mobility/ADLs:  Walking   Assisted  Transfer  Assisted  Bathing  Assisted  Dressing  Assisted  Toileting  Assisted  Feeding  Assisted  Med Admin  Assisted  Med Delivery   whole and prefers mixed with applesauce    Wound Care Documentation and Therapy:  Wound 03/31/23 Coccyx Mid SMALL  SKIN PEELING,REDNESS,BLANCHABLE (Active)   Wound Etiology Pressure Stage 2 04/07/23 0956   Dressing Status Clean 04/07/23 0956   Wound Cleansed Soap and water 04/04/23 1324   Dressing/Treatment Triad hydro/zinc oxide-based hydrophilic paste 04/07/23 0956   Wound Assessment Dry 04/07/23 0956   Drainage Amount None (dry) 04/07/23 0956   Odor None 04/07/23 0956   Peri-wound Assessment Blanchable erythema 04/07/23 0956   Number of days: 6       Wound 03/31/23 Ankle Distal;Left;Posterior (Active)   Dressing Status Clean;Dry;Intact 04/07/23 0956   Dressing/Treatment Silicone border 04/04/23 1324   Wound Assessment Dry;Erythema;Pink/red 04/07/23 0956   Drainage Amount None (dry) 04/07/23 0956   Odor None 04/04/23 1324   Number of days: 6       Incision 12/26/22 Back Medial;Upper (Active)   Number of days: 102       Incision 12/26/22 Back Lower;Medial (Active)   Number of days: 102        Elimination:  Continence:   Bowel: No  Bladder: No  Urinary Catheter: None   Colostomy/Ileostomy/Ileal Conduit: No       Date of Last BM: 04/08/2023    Intake/Output Summary (Last 24 hours) at 04/07/2023 1440  Last data filed at 04/07/2023 0407  Gross per 24 hour   Intake --   Output 220 ml   Net -220 ml     I/O last 3 completed shifts:  In: -   Out: 620 [Urine:620]    Safety Concerns:     At Risk for Falls    Impairments/Disabilities:      Vision    Nutrition Therapy:  Current Nutrition Therapy:   - Oral Diet:  General, Cardiac, Low Fat, and Low Sodium (2gm)    Routes of Feeding: Oral  Liquids: Thin Liquids  Daily Fluid Restriction: no  Last Modified Barium Swallow with Video (Video Swallowing Test): not done    Treatments at the Time of Hospital Discharge:    Respiratory Treatments: albuterol 2.5 mg  :  Nebulization  :  EVERY 6 HOURS PRN  :  Wheezing, Shortness of Breath   Oxygen Therapy:  is not on home oxygen therapy.  Ventilator:    - No ventilator support    Rehab Therapies: Physical Therapy and Occupational Therapy  Weight Bearing Status/Restrictions: No weight bearing restrictions  Other Medical Equipment (for information only, NOT a DME order):  walker  Other Treatments: n/a    Patient's personal belongings (please select all that are sent with patient):  Glasses    RN SIGNATURE:  Electronically signed by Kizzie Bane, RN on 04/08/23 at 8:59 AM EDT    CASE MANAGEMENT/SOCIAL WORK SECTION    Inpatient Status Date: 03/31/2023    Readmission Risk Assessment Score:  [facility] RISK OF UNPLANNED READMISSION 2.0             14.2 Total Score        Discharging to Facility/ Agency   Name: [facility]  Address: [address] [facility]  Phone: [phone]  Fax: [phone]    Dialysis Facility (if applicable)   Name:  Address:  Dialysis Schedule:  Phone:  Fax:    Case Manager/Social Worker signature: Electronically signed by Janett Labella, LSW on 04/07/23 at 2:41 PM EDT    PHYSICIAN SECTION    Prognosis: Good    Condition at Discharge: Stable    Rehab Potential (if transferring to Rehab): Good    Recommended Labs or Other Treatments After Discharge:     Physician Certification: I certify the above information and transfer of BRYER GOTTSCH  is necessary for the continuing treatment of the diagnosis listed and that she requires Skilled Nursing Facility for greater 30 days.     Update Admission H&P: No change in H&P    PHYSICIAN SIGNATURE:  Electronically signed by Sheralyn Boatman, PA-C on 04/08/23 at 8:50 AM EDT

## 2023-04-08 LAB — BASIC METABOLIC PANEL
BUN: 59 mg/dL — ABNORMAL HIGH (ref 8–23)
Calcium: 9.2 mg/dL (ref 8.8–10.2)
Chloride: 92 meq/L — ABNORMAL LOW (ref 98–111)
Creatinine: 1.1 mg/dL — ABNORMAL HIGH (ref 0.5–0.9)
Glucose: 99 mg/dL (ref 74–109)
Potassium: 5.1 meq/L (ref 3.5–5.2)
Sodium: 133 meq/L — ABNORMAL LOW (ref 135–145)

## 2023-04-08 LAB — PROTIME-INR: INR: 1.12 (ref 0.85–1.13)

## 2023-04-08 LAB — GLOMERULAR FILTRATION RATE, ESTIMATED: Est, Glom Filt Rate: 53 mL/min/{1.73_m2} — AB (ref >60)

## 2023-04-08 MED ORDER — METOPROLOL SUCCINATE ER 25 MG PO TB24
25 | Freq: Every day | ORAL | Status: AC
Start: 2023-04-08 — End: ?

## 2023-04-08 MED ORDER — AMLODIPINE BESYLATE 5 MG PO TABS
5 MG | Freq: Two times a day (BID) | ORAL | Status: DC
Start: 2023-04-08 — End: 2023-07-20

## 2023-04-08 MED ORDER — VENLAFAXINE HCL ER 37.5 MG PO CP24
37.5 MG | Freq: Every day | ORAL | Status: DC
Start: 2023-04-08 — End: 2023-05-04

## 2023-04-08 MED FILL — LIDOCARE ARM/NECK/LEG 4 % EX PTCH: 4 % | CUTANEOUS | Qty: 1 | Fill #0

## 2023-04-08 NOTE — Progress Notes (Signed)
 Assisted patient getting dressed and into wheelchair, patient family present. Wheelchair to private vehicle to [facility]

## 2023-04-08 NOTE — Discharge Summary (Signed)
 Hospitalist Discharge Note      Patient:  Michelle Bright    Unit/Bed:8A-03/003-A  Date of Birth: Dec 01, 1950  MRN: [id number]   Acct: [id number]     PCP: Elton Sin, MD  Date of Admission: 03/31/2023      Discharge date: 04/08/2023 12:20 PM    Chief Complaint on presentation :-Fatigue, palpitations    Discharge Assessment and Plan:-   Symptomatic bradycardia s/p PPM placement  3/11 (resolved): 1 week history of fatigue and palpitations. Reported as CHB at outside facility, placed on dobumatine and transferred to [facility]. On amiodarone/Toprol for pAF. EKG here showing sinus bradycardia with LBBB. Switched to epi gtt; off inotropic drips now. EP consulted; taken for PPM placement on 3/11. Per operative note; s/p successful dual-chamber PPM implantation. Heart rate currently 60s. Patient reports mild pain in left side of her chest. Per EP patient needs chest x-ray and electrocardiogram prior to discharge. Wound check in 7 to 10 days; device check in 3 months.   Chest x-ray and EKG prior to discharge  Currently on held Toprol 25 Mg p.o. daily and amiodarone 200 Mg p.o. daily  Monitor telemetry  Trend vitals  Per EP device check in 3 months - f/u with pacer clinic in 1 week  Paroxysmal atrial fibrillation: CHA2DS2-VASc 6. Rhythm controlled with amiodarone. Rate controlled with Toprol. Anticoagulated with warfarin. Has follow-up with Dr. Corrinne Eagle, electrophysiologist in the past. INR 1.61. Cardiology consulted; Coumadin restarted. Home atrial fibrillation medications on hold s/p PPM placement on 3/11.   Continue amiodarone 200 Mg p.o. daily  Continue Toprol 25 Mg p.o. daily  Continue Coumadin 1 Mg and 0.5 Mg p.o. daily for anticoagulation  Monitor telemetry  R wrist pain, improving: sudden onset. R hand xray without fx, dislocation. Shows degenerative changes. Suspect OA. Pain control. PT/OT.  Urinary retention?, ruled outPer pt, check bladder scan. She reports being unable to urinate since yesterday.  Check UA. Straight cath PRN.   Discussed with RN who reported pt had a full brief of urine and straight cath thereafter had only ~ 20 mL   Prolonged QTc: Likely from chronic left bundle branch block. Continue telemetry. Avoiding QTc prolonging agents as possible.   Hypocalcemia, mild: check ical, replace if necessary   HFrEF, nonischemic cardiomyopathy, not in exacerbation: EF 35 to 40%.  LHC 11/2021 with patent coronaries.  GDMT: Toprol, Entresto, and [place]. Also on furosemide 20mg  daily. Not acutely volume overloaded. Not complaining manage shortness of breath. Monitor I's/O's and daily weights. Cardiology consulted; home medications on held s/p PPM placement on 3/11.   Continue Toprol 25 Mg p.o. daily  Continue Aldactone 25 Mg p.o. daily  Continue Lasix 20 Mg p.o. daily  Continue holding Jardiance 10 Mg p.o. daily  CKD III: Cr 1.3 on admission. Cr baseline 1.0-1.2, currently at baseline.  Likely with some renal hypoperfusion in setting of bradycardia and hypotension. Held Lasix and Entresto initially while clinical status improved. Lasix restarted 3/10. Daily BMP.   Accelerated hypertension, resolved: 3/10 she was hypertensive; SBP in the 190s. Per cardiology had hydralazine as needed and amlodipine. Toprol and Entresto. Home medications restarted.  Continue amlodipine 5 Mg p.o. twice daily  Continue metoprolol 25 Mg p.o. daily  Continue Aldactone 25 Mg p.o. daily  Continue hydralazine 10 Mg IV every 6 hours as needed; SBP greater than 180/110  Subclinical hypothyroidism: S/p total thyroidectomy. Continue home levothyroxine. TSH elevated, T4 WNL        Initial H and P and Hospital course:-  73 year old female past medical history nonischemic cardiomyopathy, hypertension, hypothyroidism, paroxysmal atrial fibrillation presenting from outside hospital for symptomatic bradycardia.  Has been having fatigue and palpitations for approximately 1 week.  Found to have a significantly low heart rate with concern for  complete heart block outside facility.  Was started on dobutamine and transferred to [facility] for further evaluation.  On arrival to [facility], patient was switched from dobutamine to dopamine.  Hemodynamically stable.  Answering questions appropriately.  Cardiology consulted for further evaluation.      "Patient seen and evaluated at bedside. A&Ox4. Pulse 60; saturating at 93% on room air. Per nursing no acute events overnight, patient was recommended for SNF. Patient reports doing well. Reports little pain in the left side of her chest; endorses no SOB. Reports sleeping well overnight. Patient reports eating and drinking normally reports normal urination and bowel movements. Denies fever, nausea/vomiting, chest pain, SOB, constipation/diarrhea, abnormal urinary symptoms, headache, dizziness, fatigue, numbness/tingling."    3/13: I assumed care today. Pt complains of nausea and gen abd tenderness. Last BM yesterday evening. States she cannot urinate. Denies CP/palpitations/SOB.     3/14: Day of discharge.  Patient is seen sitting up in the bed, she is family at bedside.  Discussed that patient will continue on her current medications and follow-up with the device clinic in 1 week.  Per EP, she is to have a device check in 3 months time.  New medication prescriptions included amlodipine for BP control as well as Effexor for mood stabilization.  She reports improvement in her right wrist, suspect that this was just general aching/pain related to degenerative changes.  All VSS and stable for discharge to SNF.    Physical Exam:-  Vitals:   No data found.  Weight:   Weight - Scale: 49.3 kg (108 lb 11 oz)   24 hour intake/output:   No intake or output data in the 24 hours ending 04/09/23 1717    General: Dressing overlying left chest wall.  Left sling.  No distress, appears stated age.  Eyes:  PERRL. Conjunctivae/corneas clear.  HENT: Head normal appearing. Nares normal. Oral mucosa moist.  Hearing intact.   Neck: Supple, with  full range of motion. Trachea midline.  No gross JVD appreciated.  Respiratory:  Normal effort. Clear to auscultation, without rales or wheezes or rhonchi.  Cardiovascular: Normal rate, regular rhythm with normal S1/S2 without murmurs.    No lower extremity edema.   Abdomen: Soft, non-tender, non-distended with normal bowel sounds.  Musculoskeletal: R wrist swelling, minimal. Full ROM.  No abnormal movements.   Skin: Warm and dry. No rashes or lesions.  Neurologic:  No focal sensory/motor deficits in the upper or lower extremities. Cranial nerves:  grossly non-focal 2-12.     Psychiatric: Alert and oriented, normal insight and thought content.         Discharge Medications:-      Medication List        START taking these medications      amLODIPine 5 MG tablet  Commonly known as: NORVASC  Take 1 tablet by mouth in the morning and at bedtime     venlafaxine 37.5 MG extended release capsule  Commonly known as: EFFEXOR XR  Take 1 capsule by mouth daily (with breakfast)            CHANGE how you take these medications      metoprolol succinate 25 MG extended release tablet  Commonly known as: TOPROL XL  Take 1 tablet  by mouth daily  What changed: how much to take            CONTINUE taking these medications      amiodarone 200 MG tablet  Commonly known as: CORDARONE     Farxiga 10 MG tablet  Generic drug: dapagliflozin  TAKE ONE TABLET BY MOUTH EVERY MORNING     furosemide 20 MG tablet  Commonly known as: LASIX  Take 1 tablet by mouth daily     HYDROcodone-acetaminophen 5-325 MG per tablet  Commonly known as: NORCO     hydrOXYzine HCl 10 MG tablet  Commonly known as: ATARAX     lactobacillus capsule  Take 1 capsule by mouth daily (with breakfast)     levothyroxine 125 MCG tablet  Commonly known as: SYNTHROID     lidocaine 4 % external patch  Place 2 patches onto the skin daily     nitroGLYCERIN 0.4 MG SL tablet  Commonly known as: NITROSTAT  up to max of 3 total doses. If no relief after 1 dose, call 911.      spironolactone 25 MG tablet  Commonly known as: Aldactone  Take 1 tablet by mouth daily     therapeutic multivitamin-minerals tablet     Vitamin C 500 MG Chew     vitamin D 50 MCG (2000 UT) Caps capsule  Commonly known as: CHOLECALCIFEROL     warfarin 3 MG tablet  Commonly known as: COUMADIN            STOP taking these medications      CoQ10 200 MG Caps     D-Mannose 500 MG Caps     Eliquis 5 MG Tabs tablet  Generic drug: apixaban     Entresto 49-51 MG per tablet  Generic drug: sacubitril-valsartan            ASK your doctor about these medications      acetaminophen 500 MG tablet  Commonly known as: TYLENOL               Where to Get Your Medications        Information about where to get these medications is not yet available    Ask your nurse or doctor about these medications  amLODIPine 5 MG tablet  metoprolol succinate 25 MG extended release tablet  venlafaxine 37.5 MG extended release capsule          Labs :-  Recent Results (from the past 72 hours)   Protime-INR    Collection Time: 04/07/23  5:29 AM   Result Value Ref Range    INR 1.10 0.85 - 1.13   Basic Metabolic Panel    Collection Time: 04/07/23  5:29 AM   Result Value Ref Range    Sodium 134 (L) 135 - 145 meq/L    Potassium 4.1 3.5 - 5.2 meq/L    Chloride 91 (L) 98 - 111 meq/L    CO2 32 (H) 22 - 29 meq/L    Glucose 92 74 - 109 mg/dL    BUN 49 (H) 8 - 23 mg/dL    Creatinine 1.0 (H) 0.5 - 0.9 mg/dL    Calcium 8.9 8.8 - 01.0 mg/dL   CBC    Collection Time: 04/07/23  5:29 AM   Result Value Ref Range    WBC 8.8 4.8 - 10.8 thou/mm3    RBC 3.87 (L) 4.20 - 5.40 mill/mm3    Hemoglobin 11.4 (L) 12.0 - 16.0 gm/dl    Hematocrit 27.2 (  L) 37.0 - 47.0 %    MCV 93.8 81.0 - 99.0 fL    MCH 29.5 26.0 - 33.0 pg    MCHC 31.4 (L) 32.2 - 35.5 gm/dl    RDW-CV 04.5 (H) 40.9 - 14.5 %    RDW-SD 54.1 (H) 35.0 - 45.0 fL    Platelets 206 130 - 400 thou/mm3    MPV 10.2 9.4 - 12.4 fL   Anion Gap    Collection Time: 04/07/23  5:29 AM   Result Value Ref Range    Anion Gap 11.0 8.0 -  16.0 meq/L   Glomerular Filtration Rate, Estimated    Collection Time: 04/07/23  5:29 AM   Result Value Ref Range    Est, Glom Filt Rate 59 (A) >60 ml/min/1.80m2   Protime-INR    Collection Time: 04/08/23  5:30 AM   Result Value Ref Range    INR 1.12 0.85 - 1.13   Basic Metabolic Panel    Collection Time: 04/08/23  5:30 AM   Result Value Ref Range    Sodium 133 (L) 135 - 145 meq/L    Potassium 5.1 3.5 - 5.2 meq/L    Chloride 92 (L) 98 - 111 meq/L    Glucose 99 74 - 109 mg/dL    BUN 59 (H) 8 - 23 mg/dL    Creatinine 1.1 (H) 0.5 - 0.9 mg/dL    Calcium 9.2 8.8 - 81.1 mg/dL   Glomerular Filtration Rate, Estimated    Collection Time: 04/08/23  5:30 AM   Result Value Ref Range    Est, Glom Filt Rate 53 (A) >60 ml/min/1.42m2        Microbiology:    Blood culture #1:   Lab Results   Component Value Date/Time    [facility]  03/31/2023 05:13 PM     No growth 24 hours. No growth 48 hours. No growth at 5 days    [facility]  03/31/2023 05:13 PM     No growth 24 hours. No growth 48 hours. No growth at 5 days       Blood culture #2:No results found for: "BLOODCULT2"    Organism:  No results found for: "LABGRAM"    MRSA culture only:No results found for: "MRSAC"    Urine culture:   Lab Results   Component Value Date/Time    LABURIN Colony count: 50,000-90,000 CFU/mL 12/23/2022 04:30 PM    LABURIN Colony count: >100,000 CFU/mL 12/23/2022 04:30 PM     Lab Results   Component Value Date/Time    ORG Escherichia coli 12/23/2022 04:30 PM    ORG Candida glabrata 12/23/2022 04:30 PM        Respiratory culture: No results found for: "CULTRESP"    Aerobic and Anaerobic :  No results found for: "LABAERO"  No results found for: "LABANAE"    Urinalysis:      Lab Results   Component Value Date/Time    NITRU NEGATIVE 12/23/2022 04:30 PM    WBCUA > 200 12/23/2022 04:30 PM    BACTERIA MANY 12/23/2022 04:30 PM    RBCUA > 200 12/23/2022 04:30 PM    BLOODU MODERATE 12/23/2022 04:30 PM    SPECGRAV 1.015 05/26/2022 09:32 AM    GLUCOSEU 500 12/23/2022 04:30 PM        Radiology:-  Echo (TTE) complete (PRN contrast/bubble/strain/3D)  Result Date: 04/01/2023    Left Ventricle: Normal left ventricular systolic function with a visually estimated EF of 55 - 60%. Left ventricle size is normal. Normal wall  thickness. Normal wall motion. Abnormal diastolic function.   Aortic Valve: Mildly thickened cusps. Moderately calcified cusps. Mild regurgitation. 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   Mitral Valve: Mildly calcified leaflets. Mild regurgitation.   Tricuspid Valve: Mild regurgitation.   Left Atrium: Left atrium is moderately dilated.   Image quality is adequate.     XR CHEST PORTABLE  Result Date: 04/01/2023  1 view chest x-ray. Comparison: CR/SR - XR CHEST PORTABLE - 12/24/22 18:33 EST DX/SR - XR CHEST STANDARD (2 VW) - 12/01/21 10:05 EST Findings: Normal lung volumes. No airspace disease. No pneumothorax or pleural effusion. Stable cardiomegaly.Stable interstitial thickening. No midline shift or tracheal deviation. No acute fracture. Reversed shoulder arthroplasty on the left. Vertebral plasty thoracic vertebra. Osteopenia.     Impression: 1. Stable cardiomegaly with interstitial thickening. This document has been electronically signed by: Tenna Child, MD on 04/01/2023 07:06 AM       Follow-up scheduled after discharge :-    in the next few days with Elton Sin, MD  with EP / device clinic in 1 week  In the next few weeks with Cardiology     Consultations during this hospital stay:-  []  NONE [x]  Cardiology  []  Nephrology  []  Hemo onco   []  GI   []  ID  []  Endocrine  []  Pulm    []  Neuro    []  Psych   []  Urology  []  ENT   []  G SURGERY   [] Ortho    [] CV surg    []  Palliative  []  Hospice []  Pain management   [x]  Social services   [] TCU   [x]  PT/OT  OTHERS:- EP    Disposition: SNF  Condition at Discharge: Stable    Time Spent:- 40 minutes    Electronically signed by Sheralyn Boatman, PA-C on 04/09/23 at 5:17 PM EDT   Discharging Hospitalist

## 2023-04-08 NOTE — Plan of Care (Signed)
 Problem: Chronic Conditions and Co-morbidities  Goal: Patient's chronic conditions and co-morbidity symptoms are monitored and maintained or improved  04/08/2023 0943 by Ulyses Jarred, RN  Outcome: Adequate for Discharge  Flowsheets (Taken 04/08/2023 [phone])  Care Plan - Patient's Chronic Conditions and Co-Morbidity Symptoms are Monitored and Maintained or Improved:   Monitor and assess patient's chronic conditions and comorbid symptoms for stability, deterioration, or improvement   Collaborate with multidisciplinary team to address chronic and comorbid conditions and prevent exacerbation or deterioration   Update acute care plan with appropriate goals if chronic or comorbid symptoms are exacerbated and prevent overall improvement and discharge     Problem: Discharge Planning  Goal: Discharge to home or other facility with appropriate resources  04/08/2023 0943 by Ulyses Jarred, RN  Outcome: Adequate for Discharge  Flowsheets (Taken 04/08/2023 [phone])  Discharge to home or other facility with appropriate resources:   Identify barriers to discharge with patient and caregiver   Arrange for needed discharge resources and transportation as appropriate   Identify discharge learning needs (meds, wound care, etc)   Arrange for interpreters to assist at discharge as needed   Refer to discharge planning if patient needs post-hospital services based on physician order or complex needs related to functional status, cognitive ability or social support system

## 2023-04-08 NOTE — Progress Notes (Signed)
 Report called to nurse sara at rec. Facility [facility] at [phone]. Forms faxed by charge nurse. Husband to transport at noon.

## 2023-04-08 NOTE — Care Coordination-Inpatient (Signed)
 04/10/23, 7:15 AM EDT    Patient goals/plan/ treatment preferences discussed by Case Manager and Social Worker.  Patient goals/plan/ treatment preferences reviewed with patient/ family.  Patient/ family verbalize understanding of discharge plan and are in agreement with goal/plan/treatment preferences.  Understanding was demonstrated using the teach back method.  AVS provided by RN at time of discharge, which includes all necessary medical information pertaining to the patients current course of illness, treatment, post-discharge goals of care, and treatment preferences.     Services At/After Discharge: Skilled Nursing Facility (SNF), Aide services, Nursing service, OT, and PT     Patient discharged 3/15 to [facility] skilled medicare bed.  RN called report, faxed AVS and MAR.  Patient's spouse transported.

## 2023-04-08 NOTE — Plan of Care (Signed)
 Problem: Chronic Conditions and Co-morbidities  Goal: Patient's chronic conditions and co-morbidity symptoms are monitored and maintained or improved  Outcome: Progressing     Problem: Discharge Planning  Goal: Discharge to home or other facility with appropriate resources  Outcome: Progressing     Problem: Safety - Adult  Goal: Free from fall injury  Outcome: Progressing     Problem: Pain  Goal: Verbalizes/displays adequate comfort level or baseline comfort level  Outcome: Progressing     Problem: Nutrition Deficit:  Goal: Optimize nutritional status  Outcome: Progressing     Problem: Cardiovascular - Adult  Goal: Maintains optimal cardiac output and hemodynamic stability  Outcome: Progressing  Goal: Absence of cardiac dysrhythmias or at baseline  Outcome: Progressing     Problem: Neurosensory - Adult  Goal: Achieves stable or improved neurological status  Outcome: Progressing  Goal: Achieves maximal functionality and self care  Outcome: Progressing     Problem: Respiratory - Adult  Goal: Achieves optimal ventilation and oxygenation  Outcome: Progressing     Problem: Skin/Tissue Integrity - Adult  Goal: Skin integrity remains intact  Description: 1.  Monitor for areas of redness and/or skin breakdown  2.  Assess vascular access sites hourly  3.  Every 4-6 hours minimum:  Change oxygen saturation probe site  4.  Every 4-6 hours:  If on nasal continuous positive airway pressure, respiratory therapy assess nares and determine need for appliance change or resting period  Outcome: Progressing  Goal: Incisions, wounds, or drain sites healing without S/S of infection  Outcome: Progressing  Goal: Oral mucous membranes remain intact  Outcome: Progressing     Problem: Musculoskeletal - Adult  Goal: Return mobility to safest level of function  Outcome: Progressing  Goal: Maintain proper alignment of affected body part  Outcome: Progressing  Goal: Return ADL status to a safe level of function  Outcome: Progressing      Problem: Genitourinary - Adult  Goal: Absence of urinary retention  Outcome: Progressing     Problem: Skin/Tissue Integrity  Goal: Skin integrity remains intact  Description: 1.  Monitor for areas of redness and/or skin breakdown  2.  Assess vascular access sites hourly  3.  Every 4-6 hours minimum:  Change oxygen saturation probe site  4.  Every 4-6 hours:  If on nasal continuous positive airway pressure, respiratory therapy assess nares and determine need for appliance change or resting period  Outcome: Progressing

## 2023-04-10 NOTE — Progress Notes (Signed)
 Michelle Bright is a 73 y.o. female who presents today for her medical conditions/complaints as noted below.   Chief Complaint   Patient presents with    New Patient     Admission to facility for skilled stay           HPI:     HPI  Patient is seen and examined today for admission to facility for skilled stay.  Per chart review, patient is a 73 year old female with past medical history of nonischemic cardiomyopathy, hypertension, hypothyroidism, paroxysmal A-fib who presented from an outside hospital to [facility] after complaining of fatigue and palpitations for 1 week prior she was found to have significant bradycardia with concern for complete heart break at the outside facility.  She was started on dobutamine and transferred to [facility].  Here, she was switched to dopamine and admitted with cardiology consult.  EKG at [facility] showed sinus bradycardia with left bundle branch block.  She was initially placed on an epi drip and EP was consulted.  On 3/11 patient had successful implantation of dual-chamber PPM.  She was placed on amiodarone 200 mg daily.  Patient also has history of paroxysmal A-fib Coumadin was restarted and she has follow-up with EP.  She was also complaining of right wrist pain sudden onset while in the hospital.  X-ray showed no fracture or dislocation but suspected osteoarthritis.  Patient also has heart failure with last EF 35 to 45% and left heart cath in November 2023 with patent coronaries.  She is on Toprol, [place], Farxiga and furosemide.  This remained stable throughout the hospitalization.  Patient's creatinine on admission was 1.3 with her baseline being 1-1.2.  Lasix and Entresto were initially held but Lasix was restarted on 04/03/2023.  Hospitalization was also complicated by hypertension with systolic blood pressure in the 190s.  Home amlodipine was started and patient was on hydralazine as needed.  Patient's symptoms did  improve and she was sent to this facility for skilled stay.    Today, patient is sitting in her room. She denies any pain.   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 bladder, acquired     Thyroid disease     Removed       Past Surgical History:   Procedure Laterality Date    BLADDER SURGERY  03/11/2021    pessary    CARDIAC PROCEDURE Left 12/22/2021    Left heart cath / coronary angiography performed by Archie Balboa, MD at [facility] CARDIAC CATH LAB    FEMUR SURGERY  12/2014    left femur    FRACTURE SURGERY      SPINE SURGERY N/A 12/26/2022    KYPHOPLASTY S1-2 + T5 performed by Reyne Dumas, MD at [facility] OR    STIMULATOR SURGERY N/A 02/11/2020    STAGE 1 INTERSTIM performed by Helane Rima, MD at [facility] OR    STIMULATOR SURGERY N/A 02/25/2020    STAGE 2 STIMULATOR INSERTION performed by Helane Rima, MD at [facility] OR    THYROID SURGERY  2005    TRANSESOPHAGEAL ECHOCARDIOGRAM N/A 01/05/2022    TRANSESOPHAGEAL ECHOCARDIOGRAM performed by  Archie Balboa, MD at [facility] ENDOSCOPY       No family history on file.    Social History     Tobacco Use    Smoking status: Former     Current packs/day: 0.00     Types: Cigarettes     Quit date: 03/09/1992     Years since quitting: 31.1    Smokeless tobacco: Never   Substance Use Topics    Alcohol use: Not Currently      Allergies   Allergen Reactions    Xarelto [Rivaroxaban] Dizziness or Vertigo       Health Maintenance   Topic Date Due    Hepatitis C screen  Never done    DTaP/Tdap/Td vaccine (1 - Tdap) Never done    Colorectal Cancer Screen  Never done    DEXA (modify frequency per FRAX score)  Never done    Lipids  04/10/2020    Breast cancer screen  01/27/2022    COVID-19 Vaccine (7 - 2024-25 season) 09/25/2022    Annual Wellness Visit (Medicare Advantage)  Never done     Depression Screen  12/24/2023    GFR test (Diabetes, CKD 3-4, OR last GFR 15-59)  04/07/2024    Flu vaccine  Completed    Shingles vaccine  Completed    Pneumococcal 50+ years Vaccine  Completed    Respiratory Syncytial Virus (RSV) Pregnant or age 53 yrs+  Completed    Hepatitis A vaccine  Aged Out    Hepatitis B vaccine  Aged Out    Hib vaccine  Aged Out    Polio vaccine  Aged Out    Meningococcal (ACWY) vaccine  Aged Out    Meningococcal B vaccine  Aged Out       Subjective:      Review of Systems   Constitutional:  Negative for chills, fatigue and fever.   HENT:  Negative for congestion, rhinorrhea and sore throat.    Eyes:  Negative for redness and visual disturbance.   Respiratory:  Negative for cough, shortness of breath and wheezing.    Cardiovascular:  Negative for chest pain and leg swelling.   Gastrointestinal:  Negative for constipation, diarrhea, nausea and vomiting.   Endocrine: Negative for polydipsia and polyuria.   Genitourinary:  Negative for dysuria, frequency and hematuria.   Musculoskeletal:  Negative for arthralgias, gait problem and myalgias.   Skin:  Positive for wound. Negative for rash.   Allergic/Immunologic: Positive for immunocompromised state. Negative for environmental allergies.   Neurological:  Positive for weakness. Negative for dizziness, light-headedness and headaches.   Hematological:  Does not bruise/bleed easily.   Psychiatric/Behavioral:  Negative for dysphoric mood and sleep disturbance. The patient is not nervous/anxious.        Objective:     Physical Exam  Vitals and nursing note reviewed.   Constitutional:       General: She is not in acute distress.     Appearance: She is well-developed. She is ill-appearing (chronically).   HENT:      Head: Normocephalic and atraumatic.      Right Ear: External ear normal.      Left Ear: External ear normal.      Nose: Nose normal.   Eyes:      General: No scleral icterus.        Right eye: No discharge.         Left eye: No  discharge.      Conjunctiva/sclera: Conjunctivae normal.  Neck:      Thyroid: No thyromegaly.      Vascular: No JVD.   Cardiovascular:      Rate and Rhythm: Normal rate and regular rhythm.      Heart sounds: Normal heart sounds.   Pulmonary:      Effort: Pulmonary effort is normal. No respiratory distress.      Breath sounds: No stridor. Examination of the right-lower field reveals rales. Examination of the left-lower field reveals rales. Rales present. No wheezing.   Chest:      Chest wall: Tenderness present.   Abdominal:      General: Bowel sounds are normal. There is no distension.      Palpations: Abdomen is soft.      Tenderness: There is no abdominal tenderness.   Musculoskeletal:         General: No deformity. Normal range of motion.      Right shoulder: No tenderness or bony tenderness.      Left shoulder: No tenderness or bony tenderness.      Cervical back: Neck supple. No tenderness or bony tenderness.      Thoracic back: No spasms, tenderness or bony tenderness.      Lumbar back: No tenderness or bony tenderness.   Lymphadenopathy:      Cervical: No cervical adenopathy.      Upper Body:      Right upper body: No supraclavicular adenopathy.      Left upper body: No supraclavicular adenopathy.   Skin:     General: Skin is warm and dry.      Findings: No erythema or rash.   Neurological:      Mental Status: She is alert and oriented to person, place, and time.      Motor: No atrophy, abnormal muscle tone or seizure activity.   Psychiatric:         Mood and Affect: Affect is labile.         Speech: Speech normal.         Behavior: Behavior normal.       BP 132/64   Pulse 60   Temp 97.7 F (36.5 C)   Resp 15   Wt 43.7 kg (96 lb 6.4 oz)   SpO2 95%   BMI 19.46 kg/m     Assessment/Plan   Assessment & Plan   1. Symptomatic bradycardia    2. Paroxysmal atrial fibrillation (HCC)    3. HFrEF (heart failure with reduced ejection fraction) (HCC)    4. Primary hypertension    5. Hypocalcemia    6.  Hypothyroidism, unspecified type      Status post PPM placement on 3/11.  Follows up with cardiology as outpatient.  Continue Toprol, amiodarone and has follow-up with pacer clinic in 1 week.  Therapy to eval and treat. Incentive spirometry every 2 hours while awake. Monitor lung sounds.   Continue with rate control with metoprolol, amiodarone, OAC with Coumadin.  Monitor INR.  Continue Farxiga, metoprolol, spironolactone.  Follows with cardiology  Has been controlled with metoprolol, spironolactone, amlodipine.  5.  Replaced while in hospital.  Discharge calcium was 9.2.  6.  Chronic.  TSH was 14.7 on 04/01/2023 however T4 was normal.  Will repeat labs in 3 weeks . Cont levothyroxine 125 mcg      Patient seen and examined.  History partially obtained by chart review and nursing notes. I have reviewed patient's past medical, surgical, social, and family history and have made updates where  appropriate.  See facility EMR for updated medication list.      More than 50% of the total visit time must involve counseling/coordination of care. The total visit time encompasses both the face-to-face time spent with the patient at the bedside and the additional time spent on the unit/floor reviewing data, obtaining relevant patient information, and discussing the case with other involved healthcare providers.    On this date 04/11/23 I have spent 52 minutes reviewing previous notes, test results and face to face with the patient discussing the diagnosis and importance of compliance with the treatment plan as well as documenting on the day of the visit.     Electronically signed by Lenise Herald, MD on 04/11/2023 at 11:33 AM

## 2023-04-11 ENCOUNTER — Encounter: Payer: MEDICARE | Attending: Family Medicine | Primary: Family Medicine

## 2023-04-11 DIAGNOSIS — R001 Bradycardia, unspecified: Secondary | ICD-10-CM

## 2023-04-12 LAB — CBC WITH AUTO DIFFERENTIAL
Basophils %: 1.3 %
Basophils Absolute: 0.1 /uL
Eosinophils %: 1.7 %
Eosinophils Absolute: 0.2 /uL
Hematocrit: 34.7 % — AB (ref 36–46)
Hemoglobin: 11.2 g/dL — AB (ref 12.0–16.0)
Lymphocytes %: 20.5 %
Lymphocytes Absolute: 1.9 /uL
MCH: 30 pg
MCHC: 32.2 g/dL
MCV: 93 fL
MPV: 8 fL
Monocytes %: 9.5 %
Monocytes Absolute: 0.9 /uL
Neutrophils %: 67 %
Neutrophils Absolute: 6.4 /uL
Platelets: 276 K/ÂµL
RBC: 3.73 10^6/ÂµL
RDW: 16.3 %
WBC: 9.5 10*3/mL

## 2023-04-12 LAB — BASIC METABOLIC PANEL
BUN: 46 mg/dL
CO2: 34 mmol/L
Calcium: 8.8 mg/dL
Chloride: 93 mmol/L
Creatinine: 0.9 mg/dL
Est, Glom Filt Rate: 61
Glucose: 80 mg/dL
Potassium: 4.6 mmol/L
Sodium: 138 mmol/L

## 2023-04-12 LAB — TSH: TSH: 3.327 u[IU]/mL

## 2023-04-13 ENCOUNTER — Ambulatory Visit: Admit: 2023-04-13 | Discharge: 2023-04-13 | Payer: MEDICARE | Primary: Family Medicine

## 2023-04-13 ENCOUNTER — Ambulatory Visit: Admit: 2023-04-13 | Discharge: 2023-04-13 | Payer: MEDICARE | Attending: Family | Primary: Family Medicine

## 2023-04-13 VITALS — BP 128/70 | HR 60 | Ht 59.0 in | Wt 96.4 lb

## 2023-04-13 DIAGNOSIS — I428 Other cardiomyopathies: Secondary | ICD-10-CM

## 2023-04-13 DIAGNOSIS — Z950 Presence of cardiac pacemaker: Secondary | ICD-10-CM

## 2023-04-13 NOTE — Progress Notes (Signed)
 INITIAL DEVICE CHECK IN OFFICE AFTER GEN CHANGE     THIS IS A [facility] PT AND SHE DOES NOT WANT A MONITOR AND I CALLED CINDY WITH DR Geradine Girt AND LET HER KNOW THIS PT NEEDS SEEN AGAIN IN THEIR DEVICE CLINIC IN 3 MTHS FOR CHRONIC VALUES AND SHE HAS NO MONITOR AND DOES NOT WANT ONE . SHE DOES NOT HAVE A CARDIOLOGIST IN THIS PRACTICE     LT UPPER INCISION LINE WITHOUT ANY S/S OF INFECTION. INCISION LINE WELL APPROX.   NO S/S OF INFECTION AND NO SWELLING WITH MINIMAL BRUISING.     REVIEWED ALL POST OP INSTRUCTIONS WITH PT AND FAMILY     DEVICE INTERROGATION FAXED TO DR Suszanne Finch OFFICE

## 2023-04-13 NOTE — Progress Notes (Addendum)
 Heart Failure Clinic       Visit Date: 04/13/2023  Cardiologist:  Dr. Geradine Girt - Melvenia Beam   Primary Care Physician: Dr. Elton Sin, MD    Michelle Bright is a 73 y.o. female who presents today for:  Chief Complaint   Patient presents with    Congestive Heart Failure       HPI:     TYPE HF: HFimpEF 55-60% 2025 (35-40% 2024) (25-30% 2023) (58% 2018)  Cause: improved nonischemic (newly found)   Device: Dual PPM - bradycardia   HX: Pafib (2016), CVA  Dry Wt:  94 on 12/28/21, 92 on 01/28/22, 95 on 03/30/22, 91 on 07/05/22, 100 on 10/13/22, 96 on 04/13/23      Michelle Bright is a 73 y.o. female who presents to the office for a f/u patient visit in the heart failure clinic.    Concerns today: her etoday for her 6 monght f/u. Recent admission for symptomatic bradycardia - documented CHB, now s/p PPM. In SNF for rehab. Repeat ECHO done showing improved EF as well. Continues on her lasix 20mg  w/ good response.     Activity: ADLs without limitation   Diet: ordered    Patient has:  Chest Pain: no  SOB: yes since hospitalization   Orthopnea/PND: resolved OSA: no  Edema: resolved  Fatigue: resolved   Abdominal bloating: no  Cough: no  Appetite: good    Visit on 10/13/22:  3 month f/u. Weight is up but wanted. Urinating well. No hospitalizations. Started on Amio by primary cardiologist     Activity: ADLs without limitation   Diet: Educated and following    Patient has:  Chest Pain: no  SOB: resolved   Orthopnea/PND: resolved OSA: no  Edema: resolved  Fatigue: resolved   Abdominal bloating: no  Cough: no  Appetite: good    Visit on 07/05/22: here today for her 3 month f/u. Weight is stable. Urinating well, has not needed her metolazone. In a-fib upon arrival today and is asymptomatic. Denies leg swelling, bloating, orthopnea, or PND. Following diet.       Visit on 03/30/22: 8 week f/u. Pt did see Dr. Corrinne Eagle and her [name] was increased with 2 week ECHO to decide on ICD. Her EF did improved to 35-40%. Since her last visit she  has noted improved appetite. Denies SOB, leg swelling, bloating, and orthopnea.     Visit on 01/28/22:  8 week f/u. Pt is s/p her TEE and her 48hr holter monitor. She is thus far tolerating her medications. She is getting repeat labs after her Farxiga at the end of the month. Her TEE shows moderate MR with continued low EF. Her monitor shows sinus brady with some a-flutter - f/u w/ [facility] 2/14. Today she notes that since starting both the Entresto and [place] she has noted less fatigue and less SOB. She denies leg swelling or orthopnea. Following her diet.     Visit on 12/28/21: here today as a new pt referred by Dr. Corrinne Eagle for management of her newly found systolic HF. Pt started with a new onset of Afib in 2016 found during a surgery. She has never had a cardioversion or an ablation done. She was started on an Eliquis but did not tolerate it so it was stopped. She then suffered a CVA in 2018 with left sided deficits that have since resolved, she was started on coumadin at this time. Dr. Anastasio Champion ordered an ECHO in October d/t episodes of HTN, return  of swelling, and SOB and was found to have an EF of 25-30%. Prior to the ECHO she was started on metolazone with improvement of her symptoms. She has been on lasix for yrs with good urination. She also had noted orthopnea prior to her metolazone use. She does follow a low Na/fluid diet.       Patient follows:      Hospitalization:              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 bladder, acquired     Thyroid disease     Removed      Past Surgical History:   Procedure Laterality Date    BLADDER SURGERY  03/11/2021    pessary    CARDIAC PROCEDURE Left 12/22/2021    Left heart cath / coronary angiography performed by Archie Balboa, MD at [facility]  CARDIAC CATH LAB    EP DEVICE PROCEDURE N/A 04/04/2023    Insert PPM dual performed by Andres Labrum, MD at [facility] CARDIAC CATH LAB    FEMUR SURGERY  12/2014    left femur    FRACTURE SURGERY      SPINE SURGERY N/A 12/26/2022    KYPHOPLASTY S1-2 + T5 performed by Reyne Dumas, MD at [facility] OR    STIMULATOR SURGERY N/A 02/11/2020    STAGE 1 INTERSTIM performed by Helane Rima, MD at [facility] OR    STIMULATOR SURGERY N/A 02/25/2020    STAGE 2 STIMULATOR INSERTION performed by Helane Rima, MD at [facility] OR    THYROID SURGERY  2005    TRANSESOPHAGEAL ECHOCARDIOGRAM N/A 01/05/2022    TRANSESOPHAGEAL ECHOCARDIOGRAM performed by Archie Balboa, MD at [facility] ENDOSCOPY     History reviewed. No pertinent family history.  Social History     Tobacco Use    Smoking status: Former     Current packs/day: 0.00     Types: Cigarettes     Quit date: 03/09/1992     Years since quitting: 31.1    Smokeless tobacco: Never   Substance Use Topics    Alcohol use: Not Currently     Current Outpatient Medications   Medication Sig Dispense Refill    metoprolol succinate (TOPROL XL) 25 MG extended release tablet Take 1 tablet by mouth daily      venlafaxine (EFFEXOR XR) 37.5 MG extended release capsule Take 1 capsule by mouth daily (with breakfast)      amLODIPine (NORVASC) 5 MG tablet Take 1 tablet by mouth in the morning and at bedtime      HYDROcodone-acetaminophen (NORCO) 5-325 MG per tablet Take 1 tablet by mouth every 6 hours as needed for Pain.      hydrOXYzine HCl (ATARAX) 10 MG tablet Take 1 tablet by mouth 3 times daily as needed for Anxiety      lactobacillus (CULTURELLE) capsule Take 1 capsule by mouth daily (with breakfast)      lidocaine 4 % external patch Place 2 patches onto the skin daily      FARXIGA 10 MG tablet TAKE ONE TABLET BY MOUTH EVERY MORNING 90 tablet 3    amiodarone (CORDARONE) 200 MG tablet Take 1 tablet  by mouth daily      spironolactone (ALDACTONE) 25 MG tablet Take 1 tablet by mouth daily 90 tablet 3    furosemide (LASIX) 20 MG  tablet Take 1 tablet by mouth daily 90 tablet 3    Multiple Vitamins-Minerals (THERAPEUTIC MULTIVITAMIN-MINERALS) tablet Take 1 tablet by mouth daily      Cholecalciferol (VITAMIN D3) 50 MCG (2000 UT) CAPS Take by mouth      Ascorbic Acid (VITAMIN C) 500 MG CHEW Take 500 mg by mouth daily      nitroGLYCERIN (NITROSTAT) 0.4 MG SL tablet up to max of 3 total doses. If no relief after 1 dose, call 911. 25 tablet 3    warfarin (COUMADIN) 3 MG tablet Take 1 tablet by mouth daily      levothyroxine (SYNTHROID) 125 MCG tablet Take 1 tablet by mouth Daily      acetaminophen (TYLENOL) 500 MG tablet Take 1 tablet by mouth every 6 hours as needed for Pain (Patient not taking: Reported on 01/11/2023)       No current facility-administered medications for this visit.     Allergies   Allergen Reactions    Eliquis [Apixaban] Hallucinations     Pt was shaking when taking     Xarelto [Rivaroxaban] Dizziness or Vertigo       SUBJECTIVE:   Review of Systems   Constitutional:  Negative for activity change, appetite change and fatigue.   Respiratory:  Negative for cough, shortness of breath and wheezing.    Cardiovascular:  Negative for chest pain, palpitations and leg swelling.   Gastrointestinal:  Negative for abdominal distention and abdominal pain.   Musculoskeletal:  Negative for gait problem.   Neurological:  Negative for weakness, light-headedness and headaches.   Psychiatric/Behavioral:  Negative for sleep disturbance.        OBJECTIVE:   Today's Vitals:  BP 128/70   Pulse 60   Ht 1.499 m (4\' 11" )   Wt 43.7 kg (96 lb 6 oz)   SpO2 92%   BMI 19.47 kg/m     Physical Exam  Vitals reviewed.   Constitutional:       General: She is not in acute distress.     Appearance: Normal appearance. She is well-developed. She is not diaphoretic.   HENT:      Head: Normocephalic and atraumatic.   Eyes:      Conjunctiva/sclera: Conjunctivae normal.   Cardiovascular:      Rate and Rhythm: Normal rate and regular rhythm.      Heart sounds:  Murmur (grade 2) heard.   Pulmonary:      Effort: Pulmonary effort is normal. No respiratory distress.      Breath sounds: Normal breath sounds. No wheezing, rhonchi or rales.   Abdominal:      General: Bowel sounds are normal. There is no distension.      Palpations: Abdomen is soft.      Tenderness: There is no abdominal tenderness.   Musculoskeletal:         General: Normal range of motion.      Cervical back: Normal range of motion and neck supple.      Right lower leg: No edema.      Left lower leg: No edema.   Skin:     General: Skin is warm and dry.      Capillary Refill: Capillary refill takes less than 2 seconds.   Neurological:      Mental Status: She is alert and  oriented to person, place, and time.      Coordination: Coordination normal.   Psychiatric:         Behavior: Behavior normal.         Wt Readings from Last 3 Encounters:   04/13/23 43.7 kg (96 lb 6 oz)   04/06/23 49.3 kg (108 lb 11 oz)   12/30/22 69.8 kg (153 lb 14.1 oz)     BP Readings from Last 3 Encounters:   04/13/23 128/70   04/08/23 (!) 138/53   01/11/23 111/69     Pulse Readings from Last 3 Encounters:   04/13/23 60   04/08/23 60   01/11/23 83     Body mass index is 19.47 kg/m.    ECHO:     Echo Findings 03/2023    Left Ventricle Normal left ventricular systolic function with a visually estimated EF of 55 - 60%. Left ventricle size is normal. Normal wall thickness. Normal wall motion. Abnormal diastolic function.   Left Atrium Left atrium is moderately dilated.   Right Ventricle Right ventricle size is normal. Normal systolic function.   Right Atrium Right atrium size is normal.   Aortic Valve Mildly thickened cusps. Moderately calcified cusps. Mild regurgitation. 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   Mitral Valve Mildly calcified leaflets. Mild regurgitation. No stenosis noted.   Tricuspid Valve Valve structure is normal. Mild regurgitation. No stenosis noted.   Pulmonic Valve The pulmonic valve visualization is suboptimal but  appears to be functioning normally. Physiologically normal regurgitation. No stenosis noted.   Aorta Normal sized aortic root and ascending aorta.   IVC/Hepatic Veins IVC diameter is less than or equal to 21 mm and decreases greater than 50% during inspiration; therefore the estimated right atrial pressure is normal (~3 mmHg). IVC size is normal.   Pericardium No pericardial effusion.             CATH/STRESS:     Patent coronaries 2023      Results reviewed:  BNP: No results found for: "BNP"  CBC:   Lab Results   Component Value Date/Time    WBC 8.8 04/07/2023 05:29 AM    RBC 3.87 04/07/2023 05:29 AM    RBC 17 01/21/2020 09:08 AM    HGB 11.4 04/07/2023 05:29 AM    HCT 36.3 04/07/2023 05:29 AM    PLT 206 04/07/2023 05:29 AM     CMP:    Lab Results   Component Value Date/Time    NA 133 04/08/2023 05:30 AM    K 5.1 04/08/2023 05:30 AM    K 5.2 12/24/2022 03:27 AM    CL 92 04/08/2023 05:30 AM    CO2 32 04/07/2023 05:29 AM    BUN 59 04/08/2023 05:30 AM    CREATININE 1.1 04/08/2023 05:30 AM    LABGLOM 53 04/08/2023 05:30 AM    LABGLOM 86 04/13/2022 12:16 PM    GLUCOSE 99 04/08/2023 05:30 AM    CALCIUM 9.2 04/08/2023 05:30 AM     Hepatic Function Panel:    Lab Results   Component Value Date/Time    ALKPHOS 125 04/01/2023 08:17 AM    ALT 15 04/01/2023 08:17 AM    AST 33 04/01/2023 08:17 AM    BILITOT 0.3 04/01/2023 08:17 AM    BILITOT Negative 01/21/2020 09:08 AM    BILIDIR 0.2 04/01/2023 08:17 AM     Magnesium:    Lab Results   Component Value Date/Time    MG 2.1 04/02/2023 08:05  AM     PT/INR:    Lab Results   Component Value Date/Time    INR 1.12 04/08/2023 05:30 AM     Lipids:    Lab Results   Component Value Date/Time    TRIG 107 04/11/2015 05:15 AM    HDL 64 04/11/2015 05:15 AM       ASSESSMENT AND PLAN:   The patient's condition/symptoms are stable        Diagnosis Orders   1. improved Nonischemic cardiomyopathy (HCC)        2. Chronic systolic congestive heart failure, NYHA class 2, stage C (HCC)        3.  Persistent atrial fibrillation (HCC)        4. At risk for fluid volume overload              Continue:  GDMT:   ACE/ARB/ARNI -    BB - toprol 25 daily   Diuretic - Lasix 20 daily   AA - Aldactone 25mg  daily   SGLT2 -  Farxiga 10 daily   Vasodilator -   Other - coumadin, synthroid, amio 200 daily, amlodipine 5 daily       HFimpEF 55-60% 20245 (35-40% 2024) (25-30% 2023)  Improved Nonischemic - valvular vs chronic ? 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     Stable, no fluid on exam but notes more SOB. Chest xray in house showed some pulmonary edema. Will increase lasix for 3 days. Assume entresto stopped in house d/t hypotension. Will monitor and add back     Lab reviewed - K 5.1 Cr 1.1 mag 2.1 Hgb 11.4    ECHO 2025: DD, mod dilated LA, mild to mod AS, mild MR, mild TR RA ~ 3  CATH 2023: patent coronaries LVEDP 15    Take lasix 40mg  for 3 days    BMP and Pro BNP in two weeks.       Continue diet/fluid adherence  Continue daily wts.  F/U w/ Cardiology  F/U in clinic in 3 months      Tolerating above noted HF meds, no ill side effects noted. Will continue to monitor kidney function and electrolytes. Will optimize as tolerated.   Pt is compliant w/ medications.    Total visit time of  minutes has been spent with patient on education of symptoms, management, medication, and plan of care; as well as review of chart: labs, ECHO, radiology reports, etc.   I personally spent more then 50% of the appt time face to face with the patient.  Daily weights  Fluid restriction of 2 Liters per day  Limit sodium in diet to around 1500-2000 mg/day  Monitor BP  Activity as tolerated     Patient was instructed to call the Heart Failure Clinic for any changes in symptoms as noted in AVS.       Return in about 3 months (around 07/14/2023). or sooner if needed     Patient given educational materials - see patient instructions.   We discussed the importance of weighing oneself and recording daily. We also discussed the importance of a low sodium diet, higher sodium foods to avoid and better low sodium food options.   Patient verbalizes understanding of  plan of care using teach back method, and is agreeable to the treatment plan.       Electronically signed by Crecencio Mc, APRN - CNP on 04/13/2023 at 11:28 AM

## 2023-04-13 NOTE — Progress Notes (Signed)
 Pt here for 6 mo check up     Pt had med changes     Pt continues with sob , weak     Pt is getting therapy

## 2023-04-13 NOTE — Patient Instructions (Addendum)
 You may receive a survey regarding the care you received during your visit.  Your input is valuable to [place].  We encourage you to complete and return your survey.  We hope you will choose [place] in the future for your healthcare needs.    Your nurses today were [place] and [employer].  Office hours:   Mon-Thurs 8-4:30  Friday 8-12  Phone: [phone]    Continue:  Continue current medications  Daily weights and record  Fluid restriction of 2 Liters per day  Limit sodium in diet to around 1500-2000 mg/day  Monitor BP  Activity as tolerated     Call the Heart Failure Clinic for any of the following symptoms:   Weight gain of -3 pounds in 1 day or 5 pounds in 1 week  Increased shortness of breath  Shortness of breath while laying down  Cough  Chest pain  Swelling in feet, ankles or legs  Bloating in abdomen  Fatigue        Take lasix 40mg  for 3 days    BMP and Pro BNP in two weeks.       Continue diet/fluid adherence  Continue daily wts.  F/U w/ Cardiology  F/U in clinic in 3 months

## 2023-04-15 ENCOUNTER — Encounter

## 2023-04-15 MED ORDER — HYDROCODONE-ACETAMINOPHEN 5-325 MG PO TABS
5-325 | ORAL_TABLET | Freq: Four times a day (QID) | ORAL | 0 refills | Status: AC | PRN
Start: 2023-04-15 — End: 2023-05-15

## 2023-04-28 NOTE — Telephone Encounter (Signed)
 2 week bmp and bnp scanned in labs by pcp office  Pcp addressed and discontinued lasix      Test tracking

## 2023-04-28 NOTE — Telephone Encounter (Signed)
 Nurse lindsey notified  Verbalized understanding

## 2023-04-28 NOTE — Telephone Encounter (Signed)
 I high end of normal. Low k diet with repeat k level in 2 weeks

## 2023-05-04 ENCOUNTER — Encounter: Attending: Nurse Practitioner | Primary: Family Medicine

## 2023-05-04 ENCOUNTER — Telehealth

## 2023-05-04 DIAGNOSIS — R001 Bradycardia, unspecified: Secondary | ICD-10-CM

## 2023-05-04 MED ORDER — SACUBITRIL-VALSARTAN 49-51 MG PO TABS
49-51 | ORAL_TABLET | Freq: Two times a day (BID) | ORAL | 3 refills | Status: DC
Start: 2023-05-04 — End: 2024-02-05

## 2023-05-04 MED ORDER — VENLAFAXINE HCL ER 37.5 MG PO CP24
37.5 MG | ORAL_CAPSULE | Freq: Every day | ORAL | 0 refills | Status: DC
Start: 2023-05-04 — End: 2023-07-20

## 2023-05-04 NOTE — Telephone Encounter (Signed)
 Nurse Selena Batten called back  Confirmed taking entresto 49-51 mg twice daily  BP121/61-147/64

## 2023-05-04 NOTE — Progress Notes (Signed)
 Michelle Bright is a 73 y.o. female who presents today for her medical conditions/complaints as noted below.   Chief Complaint   Patient presents with    Other     Discharge assessment           HPI:       Patient is seen and examined today for a discharge assessment after a skilled stay.  Per previous note: patient is a 73 year old female with past medical history of nonischemic cardiomyopathy, hypertension, hypothyroidism, paroxysmal A-fib who presented from an outside hospital to [facility] after complaining of fatigue and palpitations for 1 week prior she was found to have significant bradycardia with concern for complete heart break at the outside facility.  She was started on dobutamine and transferred to [facility].  Here, she was switched to dopamine and admitted with cardiology consult.  EKG at [facility] showed sinus bradycardia with left bundle branch block.  She was initially placed on an epi drip and EP was consulted.  On 3/11 patient had successful implantation of dual-chamber PPM.  She was placed on amiodarone 200 mg daily.  Patient also has history of paroxysmal A-fib Coumadin was restarted and she has follow-up with EP.  She was also complaining of right wrist pain sudden onset while in the hospital.  X-ray showed no fracture or dislocation but suspected osteoarthritis.  Patient also has heart failure with last EF 35 to 45% and left heart cath in November 2023 with patent coronaries.  She is on Toprol, [place], Farxiga and furosemide.  This remained stable throughout the hospitalization.  Patient's creatinine on admission was 1.3 with her baseline being 1-1.2.  Lasix and Entresto were initially held but Lasix was restarted on 04/03/2023.  Hospitalization was also complicated by hypertension with systolic blood pressure in the 190s.  Home amlodipine was started and patient was on hydralazine as needed.  Patient's symptoms did improve and she was sent  to this facility for skilled stay.    Patient is seen and evaluated today while sitting in her room.  Patient states that she plans on being discharged home tomorrow and is feeling confident about returning home.  She lives with her husband and her daughter who help provide her care.  She denies any issues or concerns at this time.    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 bladder, acquired     Thyroid disease     Removed       Past Surgical History:   Procedure Laterality Date    BLADDER SURGERY  03/11/2021    pessary    CARDIAC PROCEDURE Left 12/22/2021    Left heart cath / coronary angiography performed by Archie Balboa, MD at [facility] CARDIAC CATH LAB    EP DEVICE PROCEDURE N/A 04/04/2023    Insert PPM dual performed by Andres Labrum, MD at [facility] CARDIAC CATH LAB    FEMUR SURGERY  12/2014    left femur    FRACTURE SURGERY      SPINE SURGERY N/A 12/26/2022    KYPHOPLASTY S1-2 + T5 performed by Reyne Dumas, MD at [facility] OR  STIMULATOR SURGERY N/A 02/11/2020    STAGE 1 INTERSTIM performed by Helane Rima, MD at [facility] OR    STIMULATOR SURGERY N/A 02/25/2020    STAGE 2 STIMULATOR INSERTION performed by Helane Rima, MD at [facility] OR    THYROID SURGERY  2005    TRANSESOPHAGEAL ECHOCARDIOGRAM N/A 01/05/2022    TRANSESOPHAGEAL ECHOCARDIOGRAM performed by Archie Balboa, MD at [facility] ENDOSCOPY       No family history on file.    Social History     Tobacco Use    Smoking status: Former     Current packs/day: 0.00     Types: Cigarettes     Quit date: 03/09/1992     Years since quitting: 31.1    Smokeless tobacco: Never   Substance Use Topics    Alcohol use: Not Currently      Allergies   Allergen Reactions    Eliquis [Apixaban] Hallucinations     Pt was shaking when taking     Xarelto [Rivaroxaban] Dizziness  or Vertigo       Health Maintenance   Topic Date Due    Hepatitis C screen  Never done    DTaP/Tdap/Td vaccine (1 - Tdap) Never done    Colorectal Cancer Screen  Never done    DEXA (modify frequency per FRAX score)  Never done    Lipids  04/10/2020    Breast cancer screen  01/27/2022    COVID-19 Vaccine (7 - 2024-25 season) 09/25/2022    Annual Wellness Visit (Medicare Advantage)  Never done    Depression Screen  12/24/2023    GFR test (Diabetes, CKD 3-4, OR last GFR 15-59)  04/11/2024    Flu vaccine  Completed    Shingles vaccine  Completed    Pneumococcal 50+ years Vaccine  Completed    Respiratory Syncytial Virus (RSV) Pregnant or age 42 yrs+  Completed    Hepatitis A vaccine  Aged Out    Hepatitis B vaccine  Aged Out    Hib vaccine  Aged Out    Polio vaccine  Aged Out    Meningococcal (ACWY) vaccine  Aged Out    Meningococcal B vaccine  Aged Out       Subjective:      Review of Systems   Constitutional:  Negative for chills, fatigue and fever.   HENT:  Negative for congestion, rhinorrhea and sore throat.    Eyes:  Negative for redness and visual disturbance.   Respiratory:  Negative for cough, shortness of breath and wheezing.    Cardiovascular:  Negative for chest pain and leg swelling.   Gastrointestinal:  Negative for constipation, diarrhea, nausea and vomiting.   Endocrine: Negative for polydipsia and polyuria.   Genitourinary:  Negative for dysuria, frequency and hematuria.   Musculoskeletal:  Negative for arthralgias, gait problem and myalgias.   Skin:  Positive for wound. Negative for rash.   Allergic/Immunologic: Positive for immunocompromised state. Negative for environmental allergies.   Neurological:  Positive for weakness. Negative for dizziness, light-headedness and headaches.   Hematological:  Does not bruise/bleed easily.   Psychiatric/Behavioral:  Negative for dysphoric mood and sleep disturbance. The patient is not nervous/anxious.        Objective:     Physical Exam  Vitals and nursing note  reviewed.   Constitutional:       General: She is not in acute distress.     Appearance: She is well-developed. She is ill-appearing (chronically).   HENT:  Head: Normocephalic and atraumatic.      Right Ear: External ear normal.      Left Ear: External ear normal.      Nose: Nose normal.   Eyes:      General: No scleral icterus.        Right eye: No discharge.         Left eye: No discharge.      Conjunctiva/sclera: Conjunctivae normal.   Neck:      Thyroid: No thyromegaly.      Vascular: No JVD.   Cardiovascular:      Rate and Rhythm: Normal rate and regular rhythm.      Heart sounds: Normal heart sounds.   Pulmonary:      Effort: Pulmonary effort is normal. No respiratory distress.      Breath sounds: No stridor. No wheezing.   Abdominal:      General: Bowel sounds are normal. There is no distension.      Palpations: Abdomen is soft.      Tenderness: There is no abdominal tenderness.   Musculoskeletal:         General: No deformity. Normal range of motion.      Right shoulder: No tenderness or bony tenderness.      Left shoulder: No tenderness or bony tenderness.      Cervical back: Neck supple. No tenderness or bony tenderness.      Thoracic back: No spasms, tenderness or bony tenderness.      Lumbar back: No tenderness or bony tenderness.   Lymphadenopathy:      Cervical: No cervical adenopathy.      Upper Body:      Right upper body: No supraclavicular adenopathy.      Left upper body: No supraclavicular adenopathy.   Skin:     General: Skin is warm and dry.      Findings: No erythema or rash.   Neurological:      Mental Status: She is alert and oriented to person, place, and time.      Motor: No atrophy, abnormal muscle tone or seizure activity.   Psychiatric:         Mood and Affect: Affect is labile.         Speech: Speech normal.         Behavior: Behavior normal.       BP 122/60   Pulse 60   Temp 97.3 F (36.3 C)   Resp 16   Wt 44.9 kg (99 lb)   SpO2 95%   BMI 20.00 kg/m     Assessment/Plan    Assessment & Plan   1. Symptomatic bradycardia    2. Paroxysmal atrial fibrillation (HCC)    3. HFrEF (heart failure with reduced ejection fraction) (HCC)    4. Primary hypertension    5. Hypocalcemia    6. Hypothyroidism, unspecified type        Status post PPM placement on 3/11.  Follows up with cardiology as outpatient.  Continue Toprol, amiodarone.  Home health therapy to eval and treat..   Continue with rate control with metoprolol, amiodarone, OAC with Coumadin.  Monitor INR.  Continue Farxiga, metoprolol, spironolactone.  Follows with cardiology  Has been controlled with metoprolol, spironolactone, amlodipine.  5.  Replaced while in hospital.  Calcium was 9.3 on 05/01/2023.  6.  Chronic.  Continue levothyroxine.  TSH on 05/01/2023 was 1.841    Physician to Follow Referral:  Elton Sin, MD    I  certify that I, or a nurse practitioner or physician assistant working with me, had an in-person encounter with Michelle Bright on 05/04/2023 and the reason for the home care services is documented in the clinical note for that day.      Michelle Bright was assessed on the above date and was found to have medical conditions requiring Home Care that included status post pacemaker insertion, congestive heart failure.    Homebound Status: further, I certify that my clinical findings support that Michelle Bright does meet the Medicare definition of "Confined to the Home" because of   Unable to leave home without maximum effort and requires assistance of wheelchair or walker     Certification and medical necessity: I certify that, based on my findings, the following services are medically necessary home health services for Michelle Bright for the following reasons:  - Medication compliance and education for  safety concerns  - Consult Physical Therapy for  Evaluate and Treat  - Consult Occupational Therapy for  Evaluate and Treat  - Consult Home Health Aid for  assistance with Activities of Daily Living         Patient  seen and examined.  History partially obtained by chart review and nursing notes. I have reviewed patient's past medical, surgical, social, and family history and have made updates where appropriate.  See facility EMR for updated medication list.      More than 50% of the total visit time must involve counseling/coordination of care. The total visit time encompasses both the face-to-face time spent with the patient at the bedside and the additional time spent on the unit/floor reviewing data, obtaining relevant patient information, and discussing the case with other involved healthcare providers.    On this date 05/04/2023 I have spent 35 minutes reviewing previous notes, test results and face to face with the patient discussing the diagnosis and importance of compliance with the treatment plan as well as documenting on the day of the visit.     Electronically signed by Cora Collum, APRN - CNP on 05/04/2023 at 9:02 AM

## 2023-05-04 NOTE — Telephone Encounter (Signed)
 Tried to get entresto 49-51 mg prescription refill  Emilie discontinued d/t hypotension  Pt did not stop taking  Husband reports systolic bp 118-128  Asking to restart     Advise?

## 2023-05-04 NOTE — Telephone Encounter (Addendum)
 Called to nursing home to verify if they have been giving Entresto. Patient was seen in clinic on 3/20 and Sherryll Burger was not on med list from nursing home.    Nurse not available. Will call back to confirm if patient is /is not taking Entresto and what BPs have been running. Emilie also said patients potassium tends to run high.

## 2023-05-04 NOTE — Telephone Encounter (Signed)
Ok will re order

## 2023-05-25 ENCOUNTER — Ambulatory Visit: Admit: 2023-05-25 | Discharge: 2023-05-25 | Payer: MEDICARE | Attending: Registered Nurse | Primary: Family Medicine

## 2023-05-25 DIAGNOSIS — N390 Urinary tract infection, site not specified: Secondary | ICD-10-CM

## 2023-05-25 LAB — POST VOID RESIDUAL (PVR): post void residual: 0 mL

## 2023-05-25 NOTE — Progress Notes (Signed)
 [facility] [place] [facility]  [address] [place]  [place] [address]  Dept: [phone]  Loc: [phone]    Visit Date: 05/25/2023        HPI:     Michelle Bright is a 73 y.o. female who presents today for:  Chief Complaint   Patient presents with    Urinary Tract Infection     Recurrent UTI       HPI      Pt seen in follow up for recurrent UTI.      Pt has a hx of recurrent UTIs previously treated with D mannose.  Pt previously tried oxybutynin, trospium , and Myrbetriq  for her OAB without improvement.  Underwent interstim stage I by Dr. Meredeth Stallion 02/11/20 and stage II placement 02/25/20.        Hx of urinary retention requiring foley in the past.  Has pessary for POP with subsequent improvement in retention and infections.      Doing well today.  Denies fever/chills, abdominal or new back pain, dysuria, frequency, urgency, trouble emptying.       Accompanied to appt today by her husband Michelle Bright.      No infections over the last year.    Current Outpatient Medications   Medication Sig Dispense Refill    ferrous sulfate  (IRON 325) 325 (65 Fe) MG tablet Take 1 tablet by mouth daily (with breakfast)      D-Mannose 500 MG CAPS Take 500 mg by mouth daily      vitamin A 3 MG (10000 UT) capsule Take 1 capsule by mouth daily      venlafaxine  (EFFEXOR  XR) 37.5 MG extended release capsule Take 1 capsule by mouth daily (with breakfast) 30 capsule 0    sacubitril -valsartan  (ENTRESTO ) 49-51 MG per tablet Take 1 tablet by mouth 2 times daily 180 tablet 3    metoprolol  succinate (TOPROL  XL) 25 MG extended release tablet Take 1 tablet by mouth daily      FARXIGA  10 MG tablet TAKE ONE TABLET BY MOUTH EVERY MORNING 90 tablet 3    amiodarone  (CORDARONE ) 200 MG tablet Take 1 tablet by mouth daily      spironolactone  (ALDACTONE ) 25 MG tablet Take 1 tablet by mouth daily (Patient taking differently: Take 0.5 tablets by mouth daily) 90 tablet 3    Multiple Vitamins-Minerals (THERAPEUTIC  MULTIVITAMIN-MINERALS) tablet Take 1 tablet by mouth daily      Cholecalciferol (VITAMIN D3) 50 MCG (2000 UT) CAPS Take by mouth      Ascorbic Acid (VITAMIN C) 500 MG CHEW Take 500 mg by mouth daily      nitroGLYCERIN  (NITROSTAT ) 0.4 MG SL tablet up to max of 3 total doses. If no relief after 1 dose, call 911. 25 tablet 3    acetaminophen  (TYLENOL ) 500 MG tablet Take 1 tablet by mouth every 6 hours as needed for Pain      warfarin (COUMADIN ) 3 MG tablet Take 1 mg by mouth daily As directed      levothyroxine  (SYNTHROID ) 125 MCG tablet Take 1 tablet by mouth Daily      amLODIPine  (NORVASC ) 5 MG tablet Take 1 tablet by mouth in the morning and at bedtime (Patient not taking: Reported on 05/25/2023)      hydrOXYzine  HCl (ATARAX ) 10 MG tablet Take 1 tablet by mouth 3 times daily as needed for Anxiety (Patient not taking: Reported on 05/25/2023)      lactobacillus (CULTURELLE) capsule Take  1 capsule by mouth daily (with breakfast) (Patient not taking: Reported on 05/25/2023)      lidocaine  4 % external patch Place 2 patches onto the skin daily (Patient not taking: Reported on 05/25/2023)      furosemide  (LASIX ) 20 MG tablet Take 1 tablet by mouth daily (Patient not taking: Reported on 05/25/2023) 90 tablet 3     No current facility-administered medications for this visit.       Past Medical History  Michelle Bright  has a past medical history of Arthritis, Atrial fibrillation (HCC), CAD (coronary artery disease), Cerebral artery occlusion with cerebral infarction (HCC), Hypertension, Osteoporosis, Prolapse of female bladder, acquired, and Thyroid disease.    Past Surgical History  The patient  has a past surgical history that includes Femur Surgery (12/2014); Thyroid surgery (2005); fracture surgery; Stimulator Surgery (N/A, 02/11/2020); Stimulator Surgery (N/A, 02/25/2020); Bladder surgery (03/11/2021); Cardiac procedure (Left, 12/22/2021); transesophageal echocardiogram (N/A, 01/05/2022); Spine surgery (N/A, 12/26/2022); and ep device  procedure (N/A, 04/04/2023).    Family History  This patient's family history is not on file.    Social History  Michelle Bright  reports that she quit smoking about 31 years ago. Her smoking use included cigarettes. She has never used smokeless tobacco. She reports that she does not currently use alcohol. She reports that she does not use drugs.      Subjective:      Review of Systems   Constitutional:  Negative for activity change, appetite change, chills, diaphoresis, fatigue, fever and unexpected weight change.   Gastrointestinal:  Negative for abdominal pain, nausea and vomiting.   Genitourinary:  Positive for frequency. Negative for decreased urine volume, difficulty urinating, dysuria, flank pain, hematuria and urgency.   Musculoskeletal:  Negative for back pain.       Objective:   BP 118/62   Ht 1.499 m (4\' 11" )   Wt 44.9 kg (99 lb)   BMI 20.00 kg/m     Physical Exam  Vitals reviewed.   Constitutional:       General: She is not in acute distress.     Appearance: Normal appearance. She is well-developed. She is not ill-appearing or diaphoretic.   HENT:      Head: Normocephalic and atraumatic.      Right Ear: External ear normal.      Left Ear: External ear normal.      Nose: Nose normal.      Mouth/Throat:      Mouth: Mucous membranes are moist.   Eyes:      General: No scleral icterus.        Right eye: No discharge.         Left eye: No discharge.   Neck:      Vascular: No JVD.      Trachea: No tracheal deviation.   Cardiovascular:      Rate and Rhythm: Normal rate and regular rhythm.   Pulmonary:      Effort: Pulmonary effort is normal. No respiratory distress.   Abdominal:      General: There is no distension.      Tenderness: There is no abdominal tenderness. There is no right CVA tenderness or left CVA tenderness.   Musculoskeletal:      Comments: Seated in wheelchair   Skin:     General: Skin is warm and dry.   Neurological:      Mental Status: She is alert and oriented to person, place, and time. Mental  status is at baseline.  Psychiatric:         Mood and Affect: Mood normal.         Behavior: Behavior normal.         Thought Content: Thought content normal.         POC  Results for orders placed or performed in visit on 05/25/23   poct post void residual   Result Value Ref Range    post void residual 0 ml         Patients recent PSA values are as follows  No results found for: "PSA"     Recent BUN/Creatinine:  Lab Results   Component Value Date/Time    BUN 46 04/12/2023 02:30 PM    CREATININE 0.9 04/12/2023 02:30 PM       Assessment:   Urinary retention, resolved  OAB  Hx recurrent UTIs  POP s/p pessary    Plan:     Brilee continues to do well with interstim device.  No further infections since pessary placed and urinary retention improved.  Very happy with control of symptoms at this time.  If any worsening in frequency in the future will arrange to see interstim rep.       Continue D mannose. If future trouble with infections consider Hiprex.     F/u in 1 year with PVR.  Call if worsening in symptoms prior.

## 2023-07-20 ENCOUNTER — Encounter: Payer: Medicare (Managed Care) | Attending: Family | Primary: Family Medicine

## 2023-07-20 ENCOUNTER — Inpatient Hospital Stay: Payer: Medicare (Managed Care) | Primary: Family Medicine

## 2023-07-20 ENCOUNTER — Encounter

## 2023-07-20 ENCOUNTER — Ambulatory Visit
Admit: 2023-07-20 | Discharge: 2023-07-20 | Payer: Medicare (Managed Care) | Attending: Family | Primary: Family Medicine

## 2023-07-20 ENCOUNTER — Inpatient Hospital Stay: Admit: 2023-07-20 | Payer: Medicare (Managed Care) | Primary: Family Medicine

## 2023-07-20 VITALS — BP 160/72 | HR 73 | Ht 59.0 in | Wt 95.2 lb

## 2023-07-20 DIAGNOSIS — I428 Other cardiomyopathies: Principal | ICD-10-CM

## 2023-07-20 DIAGNOSIS — I5022 Chronic systolic (congestive) heart failure: Principal | ICD-10-CM

## 2023-07-20 LAB — BASIC METABOLIC PANEL
BUN: 40 mg/dL — ABNORMAL HIGH (ref 8–23)
CO2: 25 meq/L (ref 22–29)
Calcium: 9.7 mg/dL (ref 8.8–10.2)
Chloride: 104 meq/L (ref 98–111)
Creatinine: 1 mg/dL — ABNORMAL HIGH (ref 0.5–0.9)
Glucose: 106 mg/dL (ref 74–109)
Potassium: 5.4 meq/L — ABNORMAL HIGH (ref 3.5–5.2)
Sodium: 143 meq/L (ref 135–145)

## 2023-07-20 LAB — BRAIN NATRIURETIC PEPTIDE: NT Pro-BNP: 1657 pg/mL — ABNORMAL HIGH (ref 0.0–124.0)

## 2023-07-20 LAB — ANION GAP: Anion Gap: 14 meq/L (ref 8.0–16.0)

## 2023-07-20 LAB — GLOMERULAR FILTRATION RATE, ESTIMATED: Est, Glom Filt Rate: 59 mL/min/{1.73_m2} — AB (ref >60)

## 2023-07-20 MED ORDER — METOLAZONE 2.5 MG PO TABS
2.5 | ORAL_TABLET | Freq: Once | ORAL | 0 refills | 30.00000 days | Status: DC
Start: 2023-07-20 — End: 2023-10-26

## 2023-07-20 MED ORDER — BUMETANIDE 0.25 MG/ML IJ SOLN
0.25 | Freq: Once | INTRAMUSCULAR | Status: AC
Start: 2023-07-20 — End: 2023-07-20
  Administered 2023-07-20: 19:00:00 0.5 mg via INTRAVENOUS

## 2023-07-20 MED ORDER — FUROSEMIDE 20 MG PO TABS
20 | ORAL_TABLET | Freq: Every day | ORAL | 3 refills | 30.00000 days | Status: DC
Start: 2023-07-20 — End: 2023-12-04

## 2023-07-20 MED ORDER — POTASSIUM CHLORIDE CRYS ER 20 MEQ PO TBCR
20 | Freq: Once | ORAL | Status: DC
Start: 2023-07-20 — End: 2023-10-26

## 2023-07-20 MED ORDER — POTASSIUM CHLORIDE CRYS ER 20 MEQ PO TBCR
20 | ORAL_TABLET | Freq: Every day | ORAL | 0 refills | 30.00000 days | Status: DC
Start: 2023-07-20 — End: 2023-10-26

## 2023-07-20 NOTE — Progress Notes (Addendum)
 Attempted x3 times for IV for diuresis. Line blew after 5ml= 0.5mg  bumex  given.  Emilie, CNP notified.

## 2023-07-20 NOTE — Addendum Note (Signed)
 Addended by: PERRI BRAUN on: 07/20/2023 03:01 PM     Modules accepted: Orders

## 2023-07-20 NOTE — Progress Notes (Signed)
 Heart Failure Clinic       Visit Date: 07/20/2023  Cardiologist:  Dr. Theodosia - Shelvy Giovanni   Primary Care Physician: Dr. Heide Herder, MD    Michelle Bright is a 73 y.o. 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 female who presents to the office for a f/u patient visit in the heart failure clinic.    Concerns today: 3 month f/u. Weight is stable. Her lasix  was stopped at some point. She notes labored breathing and more SOB with minimal exertion.     Activity: ADLs without limitation   Diet: ordered    Patient has:  Chest Pain: no  SOB: yes since hospitalization - worse without lasix   Orthopnea/PND: resolved OSA: no  Edema: resolved  Fatigue: worse since stopping lasix     Abdominal bloating: no  Cough: no  Appetite: good    Visit on 04/13/23: her etoday for her 6 monght f/u. Recent admission for symptomatic bradycardia - documented CHB, now s/p PPM. In SNF for rehab. Repeat ECHO done showing improved EF as well. Continues on her lasix  20mg  w/ good response.     Activity: ADLs without limitation   Diet: ordered    Patient has:  Chest Pain: no  SOB: yes since hospitalization   Orthopnea/PND: resolved OSA: no  Edema: resolved  Fatigue: resolved   Abdominal bloating: no  Cough: no  Appetite: good    Visit on 10/13/22:  3 month f/u. Weight is up but wanted. Urinating well. No hospitalizations. Started on Amio by primary cardiologist     Activity: ADLs without limitation   Diet: Educated and following    Patient has:  Chest Pain: no  SOB: resolved   Orthopnea/PND: resolved OSA: no  Edema: resolved  Fatigue: resolved   Abdominal  bloating: no  Cough: no  Appetite: good    Visit on 07/05/22: here today for her 3 month f/u. Weight is stable. Urinating well, has not needed her metolazone . In a-fib upon arrival today and is asymptomatic. Denies leg swelling, bloating, orthopnea, or PND. Following diet.       Visit on 03/30/22: 8 week f/u. Pt did see Dr. Gala and her Melene was increased with 2 week ECHO to decide on ICD. Her EF did improved to 35-40%. Since her last visit she has noted improved appetite. Denies SOB, leg swelling, bloating, and orthopnea.     Visit on 01/28/22:  8 week f/u. Pt is s/p her TEE and her 48hr holter monitor. She is thus far tolerating her medications. She is getting repeat labs after her Farxiga  at the end of the month. Her TEE shows moderate MR with continued low EF. Her monitor shows sinus brady with some a-flutter - f/u w/ Hakim 2/14. Today she notes that since starting both the Entresto  and the Farxiga  she has noted less fatigue and less SOB. She denies leg swelling or orthopnea. Following her diet.     Visit on 12/28/21: here today as a  new pt referred by Dr. Gala for management of her newly found systolic HF. Pt started with a new onset of Afib in 2016 found during a surgery. She has never had a cardioversion or an ablation done. She was started on an Eliquis  but did not tolerate it so it was stopped. She then suffered a CVA in 2018 with left sided deficits that have since resolved, she was started on coumadin  at this time. Dr. Herby ordered an ECHO in October d/t episodes of HTN, return of swelling, and SOB and was found to have an EF of 25-30%. Prior to the ECHO she was started on metolazone  with improvement of her symptoms. She has been on lasix  for yrs with good urination. She also had noted orthopnea prior to her metolazone  use. She does follow a low Na/fluid diet.       Patient follows:      Hospitalization:              Past Medical History:   Diagnosis Date    Arthritis     Atrial fibrillation (HCC)      CAD (coronary artery disease)     Afib     Cerebral artery occlusion with cerebral infarction (HCC)     frequency of urine    Hypertension     Osteoporosis     Prolapse of female bladder, acquired     Thyroid disease     Removed      Past Surgical History:   Procedure Laterality Date    BLADDER SURGERY  03/11/2021    pessary    CARDIAC PROCEDURE Left 12/22/2021    Left heart cath / coronary angiography performed by Aurea Tawanna LABOR, MD at [facility] CARDIAC CATH LAB    EP DEVICE PROCEDURE N/A 04/04/2023    Insert PPM dual performed by Pink Cable, MD at [facility] CARDIAC CATH LAB    FEMUR SURGERY  12/2014    left femur    FRACTURE SURGERY      11/2022    SPINE SURGERY N/A 12/26/2022    KYPHOPLASTY S1-2 + T5 performed by Charolett Malkin, MD at [facility] OR    STIMULATOR SURGERY N/A 02/11/2020    STAGE 1 INTERSTIM performed by Mancel Bathe, MD at [facility] OR    STIMULATOR SURGERY N/A 02/25/2020    STAGE 2 STIMULATOR INSERTION performed by Mancel Bathe, MD at [facility] OR    THYROID SURGERY  2005    TRANSESOPHAGEAL ECHOCARDIOGRAM N/A 01/05/2022    TRANSESOPHAGEAL ECHOCARDIOGRAM performed by Aurea Tawanna LABOR, MD at [facility] ENDOSCOPY     History reviewed. No pertinent family history.  Social History     Tobacco Use    Smoking status: Former     Current packs/day: 0.00     Types: Cigarettes     Quit date: 03/09/1992     Years since quitting: 31.3    Smokeless tobacco: Never   Substance Use Topics    Alcohol use: Not Currently     Current Outpatient Medications   Medication Sig Dispense Refill    Coenzyme Q10 (COQ-10 PO) Take 1 capsule by mouth daily      furosemide  (LASIX ) 20 MG tablet Take 1 tablet by mouth daily 90 tablet 3    ferrous sulfate  (IRON 325) 325 (65 Fe) MG tablet Take 1 tablet by mouth daily (with breakfast)      D-Mannose 500 MG CAPS Take 500 mg by mouth daily      vitamin A 3 MG (10000  UT) capsule Take 1 capsule by mouth daily      sacubitril -valsartan  (ENTRESTO ) 49-51 MG per tablet Take 1 tablet by mouth 2 times daily 180 tablet 3     metoprolol  succinate (TOPROL  XL) 25 MG extended release tablet Take 1 tablet by mouth daily      lidocaine  4 % external patch Place 2 patches onto the skin daily      FARXIGA  10 MG tablet TAKE ONE TABLET BY MOUTH EVERY MORNING 90 tablet 3    amiodarone  (CORDARONE ) 200 MG tablet Take 1 tablet by mouth daily      spironolactone  (ALDACTONE ) 25 MG tablet Take 1 tablet by mouth daily (Patient taking differently: Take 0.5 tablets by mouth daily) 90 tablet 3    Multiple Vitamins-Minerals (THERAPEUTIC MULTIVITAMIN-MINERALS) tablet Take 1 tablet by mouth daily      Cholecalciferol (VITAMIN D3) 50 MCG (2000 UT) CAPS Take by mouth      Ascorbic Acid (VITAMIN C) 500 MG CHEW Take 500 mg by mouth daily      nitroGLYCERIN  (NITROSTAT ) 0.4 MG SL tablet up to max of 3 total doses. If no relief after 1 dose, call 911. 25 tablet 3    acetaminophen  (TYLENOL ) 500 MG tablet Take 1 tablet by mouth every 6 hours as needed for Pain      warfarin (COUMADIN ) 3 MG tablet Take 1 mg by mouth daily As directed      levothyroxine  (SYNTHROID ) 125 MCG tablet Take 1 tablet by mouth Daily       No current facility-administered medications for this visit.     Allergies   Allergen Reactions    Eliquis  [Apixaban ] Hallucinations     Pt was shaking when taking     Xarelto [Rivaroxaban] Dizziness or Vertigo       SUBJECTIVE:   Review of Systems   Constitutional:  Negative for activity change, appetite change and fatigue.   Respiratory:  Negative for cough, shortness of breath and wheezing.    Cardiovascular:  Negative for chest pain, palpitations and leg swelling.   Gastrointestinal:  Negative for abdominal distention and abdominal pain.   Musculoskeletal:  Negative for gait problem.   Neurological:  Negative for weakness, light-headedness and headaches.   Psychiatric/Behavioral:  Negative for sleep disturbance.        OBJECTIVE:   Today's Vitals:  BP (!) 160/72   Pulse 73   Ht 1.499 m (4' 11)   Wt 43.2 kg (95 lb 3.2 oz)   SpO2 93%   BMI 19.23 kg/m      Physical Exam  Vitals reviewed.   Constitutional:       General: She is not in acute distress.     Appearance: Normal appearance. She is well-developed. She is not diaphoretic.   HENT:      Head: Normocephalic and atraumatic.   Eyes:      Conjunctiva/sclera: Conjunctivae normal.   Cardiovascular:      Rate and Rhythm: Normal rate and regular rhythm.      Heart sounds: Murmur (grade 2) heard.   Pulmonary:      Effort: Pulmonary effort is normal. No respiratory distress.      Breath sounds: Normal breath sounds. No wheezing, rhonchi or rales.      Comments: Diminished breath sounds bilaterally  Abdominal:      General: Bowel sounds are normal. There is no distension.      Palpations: Abdomen is soft.      Tenderness: There is no  abdominal tenderness.   Musculoskeletal:         General: Normal range of motion.      Cervical back: Normal range of motion and neck supple.      Right lower leg: No edema.      Left lower leg: No edema.   Skin:     General: Skin is warm and dry.      Capillary Refill: Capillary refill takes less than 2 seconds.   Neurological:      Mental Status: She is alert and oriented to person, place, and time.      Coordination: Coordination normal.   Psychiatric:         Behavior: Behavior normal.         Wt Readings from Last 3 Encounters:   07/20/23 43.2 kg (95 lb 3.2 oz)   05/25/23 44.9 kg (99 lb)   04/13/23 43.7 kg (96 lb 6 oz)     BP Readings from Last 3 Encounters:   07/20/23 (!) 160/72   05/25/23 118/62   04/13/23 128/70     Pulse Readings from Last 3 Encounters:   07/20/23 73   04/13/23 60   04/08/23 60     Body mass index is 19.23 kg/m.    ECHO:     Echo Findings 03/2023    Left Ventricle Normal left ventricular systolic function with a visually estimated EF of 55 - 60%. Left ventricle size is normal. Normal wall thickness. Normal wall motion. Abnormal diastolic function.   Left Atrium Left atrium is moderately dilated.   Right Ventricle Right ventricle size is normal. Normal systolic  function.   Right Atrium Right atrium size is normal.   Aortic Valve Mildly thickened cusps. Moderately calcified cusps. Mild regurgitation. 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   Mitral Valve Mildly calcified leaflets. Mild regurgitation. No stenosis noted.   Tricuspid Valve Valve structure is normal. Mild regurgitation. No stenosis noted.   Pulmonic Valve The pulmonic valve visualization is suboptimal but appears to be functioning normally. Physiologically normal regurgitation. No stenosis noted.   Aorta Normal sized aortic root and ascending aorta.   IVC/Hepatic Veins IVC diameter is less than or equal to 21 mm and decreases greater than 50% during inspiration; therefore the estimated right atrial pressure is normal (~3 mmHg). IVC size is normal.   Pericardium No pericardial effusion.             CATH/STRESS:     Patent coronaries 2023      Results reviewed:  BNP: No results found for: BNP  CBC:   Lab Results   Component Value Date/Time    WBC 9.5 04/12/2023 04:19 PM    RBC 3.73 04/12/2023 04:19 PM    RBC 17 01/21/2020 09:08 AM    HGB 11.2 04/12/2023 04:19 PM    HCT 34.7 04/12/2023 04:19 PM    PLT 276 04/12/2023 04:19 PM     CMP:    Lab Results   Component Value Date/Time    NA 138 04/12/2023 02:30 PM    K 4.6 04/12/2023 02:30 PM    K 5.2 12/24/2022 03:27 AM    CL 93 04/12/2023 02:30 PM    CO2 34 04/12/2023 02:30 PM    BUN 46 04/12/2023 02:30 PM    CREATININE 0.9 04/12/2023 02:30 PM    LABGLOM 61 04/12/2023 02:30 PM    GLUCOSE 80 04/12/2023 02:30 PM    CALCIUM  8.8 04/12/2023 02:30 PM  Hepatic Function Panel:    Lab Results   Component Value Date/Time    [facility] 125 04/01/2023 08:17 AM    ALT 15 04/01/2023 08:17 AM    AST 33 04/01/2023 08:17 AM    BILITOT 0.3 04/01/2023 08:17 AM    BILITOT Negative 01/21/2020 09:08 AM    BILIDIR 0.2 04/01/2023 08:17 AM     Magnesium :    Lab Results   Component Value Date/Time    MG 2.1 04/02/2023 08:05 AM     PT/INR:    Lab Results   Component Value Date/Time    INR  1.12 04/08/2023 05:30 AM     Lipids:    Lab Results   Component Value Date/Time    TRIG 107 04/11/2015 05:15 AM    HDL 64 04/11/2015 05:15 AM       ASSESSMENT AND PLAN:   The patient's condition/symptoms are stable        Diagnosis Orders   1. Nonischemic cardiomyopathy (HCC)        2. Chronic systolic congestive heart failure, NYHA class 2, stage C (HCC)  Basic Metabolic Panel    Brain Natriuretic Peptide    XR CHEST (2 VW)      3. Persistent atrial fibrillation (HCC)        4. At risk for fluid volume overload        5. Chronic systolic congestive heart failure, NYHA class 2, Stage C (HCC)  furosemide  (LASIX ) 20 MG tablet              Continue:  GDMT:   ACE/ARB/ARNI - Entresto  49/51 BID    BB - toprol  25 daily   Diuretic -  Lasix  20mg  daily   AA - Aldactone  12.5 mg daily   SGLT2 -  Farxiga  10 daily   Vasodilator -   Other - coumadin , synthroid , amio 200 daily      HFimpEF 55-60% 20245 (35-40% 2024) (25-30% 2023)  Improved Nonischemic - valvular vs chronic ? 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     Stable, no lower leg swelling but noted labored breathing. Will order chest xray and have her restart her lasix  20mg  daily. Blood work in 2 weeks. 3 month f/u. Sooner if no improvement of lower leg swelling.     Lab reviewed - K 4.6 Cr 0.9 mag 2.1 Hgb 11.2    ECHO 2025: DD, mod dilated LA, mild to mod AS, mild MR, mild TR RA ~ 3  CATH 2023: patent coronaries LVEDP 15    To start lasix  20mg  daily agai  Blood work in 2 weeks    Chest xray today       Continue diet/fluid adherence  Continue daily wts.  F/U w/ Cardiology  F/U in clinic in 3 months      Tolerating above noted HF meds, no ill side effects noted. Will continue to monitor kidney function and electrolytes. Will  optimize as tolerated.   Pt is compliant w/ medications.    Total visit time of  minutes has been spent with patient on education of symptoms, management, medication, and plan of care; as well as review of chart: labs, ECHO, radiology reports, etc.   I personally spent more then  50% of the appt time face to face with the patient.  Daily weights  Fluid restriction of 2 Liters per day  Limit sodium in diet to around 1500-2000 mg/day  Monitor BP  Activity as tolerated     Patient was instructed to call the Heart Failure Clinic for any changes in symptoms as noted in AVS.      Return in about 3 months (around 10/20/2023). or sooner if needed     Patient given educational materials - see patient instructions.   We discussed the importance of weighing oneself and recording daily. We also discussed the importance of a low sodium diet, higher sodium foods to avoid and better low sodium food options.   Patient verbalizes understanding of plan of care using teach back method, and is agreeable to the treatment plan.       Electronically signed by Sallyanne Moons, APRN - CNP on 07/20/2023 at 1:32 PM

## 2023-07-20 NOTE — Addendum Note (Signed)
 Addended by: PERRI BRAUN on: 07/20/2023 02:32 PM     Modules accepted: Orders

## 2023-07-20 NOTE — Telephone Encounter (Signed)
 Husband notified

## 2023-07-20 NOTE — Patient Instructions (Addendum)
 You may receive a survey regarding the care you received during your visit.  Your input is valuable to us .  We encourage you to complete and return your survey.  We hope you will choose us  in the future for your healthcare needs.    Your nurses today were [place] and [employer].  Office hours:   Mon-Thurs 8-4:30  Friday 8-12  Phone: [phone]    Continue:  Continue current medications  Daily weights and record  Fluid restriction of 2 Liters per day  Limit sodium in diet to around 1500-2000 mg/day  Monitor BP  Activity as tolerated     Call the Heart Failure Clinic for any of the following symptoms:   Weight gain of -3 pounds in 1 day or 5 pounds in 1 week  Increased shortness of breath  Shortness of breath while laying down  Cough  Chest pain  Swelling in feet, ankles or legs  Bloating in abdomen  Fatigue      To start lasix  20mg  daily agai  Blood work in 2 weeks    Chest xray today       Continue diet/fluid adherence  Continue daily wts.  F/U w/ Cardiology  F/U in clinic in 3 months

## 2023-07-20 NOTE — Telephone Encounter (Addendum)
 Husband on HIPAA notified and verbalized understanding.  Lab slip faxed to [facility]

## 2023-07-20 NOTE — Progress Notes (Signed)
 Only taking spirolactone 12.5mg  daily.  Not taking lasix  anymore.

## 2023-07-24 NOTE — Telephone Encounter (Signed)
 Called to patient. They are not home. Will call office back.

## 2023-07-24 NOTE — Telephone Encounter (Signed)
 Urinated more. Thinks breathing is also better. Not weighing. Leg swelling also  better

## 2023-07-24 NOTE — Telephone Encounter (Signed)
 Call back if symptoms worsen or change

## 2023-08-02 LAB — BASIC METABOLIC PANEL
BUN: 54 mg/dL — ABNORMAL HIGH
CO2: 22 mmol/L
Calcium: 9.3 mg/dL
Chloride: 103 mmol/L
Creatinine: 1.1 mg/dL — ABNORMAL HIGH
Est, Glom Filt Rate: 49 — AB
Glucose: 108 mg/dL — ABNORMAL HIGH
Potassium: 4.9 mmol/L
Sodium: 141 mmol/L

## 2023-08-08 NOTE — Other (Signed)
 Noted

## 2023-10-26 ENCOUNTER — Ambulatory Visit
Admit: 2023-10-26 | Discharge: 2023-10-26 | Payer: Medicare (Managed Care) | Attending: Family | Primary: Family Medicine

## 2023-10-26 VITALS — BP 130/70 | HR 62 | Ht 59.0 in | Wt 97.0 lb

## 2023-10-26 DIAGNOSIS — I4819 Other persistent atrial fibrillation: Principal | ICD-10-CM

## 2023-10-26 MED ORDER — SPIRONOLACTONE 25 MG PO TABS
25 | ORAL_TABLET | Freq: Every day | ORAL | 3 refills | 30.00000 days | Status: AC
Start: 2023-10-26 — End: ?

## 2023-10-26 NOTE — Progress Notes (Signed)
 Heart Failure Clinic       Visit Date: 10/26/2023  Cardiologist:  Dr. Theodosia [facility]   Primary Care Physician: Dr. Heide Herder, MD    Michelle Bright is a 73 y.o. female who presents today for:  Chief Complaint   Patient presents with    Congestive Heart Failure     F/u       HPI:     TYPE HF: HFimpEF 55-60% 2025 (35-40% 2024) (25-30% 2023) (58% 2018)  Cause: improved nonischemic (newly found)   Device: Dual PPM - bradycardia   HX: Pafib (2016), CVA  Dry Wt:  94 on 12/28/21, 92 on 01/28/22, 95 on 03/30/22, 91 on 07/05/22, 100 on 10/13/22, 96 on 04/13/23, 95 on 07/20/23, 97 on 10/26/23      Michelle Bright is a 73 y.o. female who presents to the office for a f/u patient visit in the heart failure clinic.    Concerns today: 3 month f/u. Michelle Bright is stable. No hospitalizations for CHF.     Activity: ADLs without DOE   Diet: following    Patient has:  Chest Pain: no  SOB: yes at baseline - not active   Orthopnea/PND: resolved   OSA: no  Edema: resolved  Fatigue: no  Abdominal bloating: no  Cough: no  Appetite: good      Visit on 07/19/23: 3 month f/u. Weight is stable. Her lasix  was stopped at some point. She notes labored breathing and more SOB with minimal exertion.     Activity: ADLs without limitation   Diet: ordered    Patient has:  Chest Pain: no  SOB: yes since hospitalization - worse without lasix   Orthopnea/PND: resolved OSA: no  Edema: resolved  Fatigue: worse since stopping lasix     Abdominal bloating: no  Cough: no  Appetite: good    Visit on 04/13/23: her etoday for her 6 monght f/u. Recent admission for symptomatic bradycardia - documented CHB, now s/p PPM. In SNF for rehab. Repeat ECHO done showing improved EF as well. Continues on her lasix  20mg  w/ good response.     Activity: ADLs without limitation   Diet: ordered    Patient has:  Chest Pain: no  SOB: yes since hospitalization   Orthopnea/PND: resolved OSA: no  Edema: resolved  Fatigue: resolved   Abdominal bloating: no  Cough: no  Appetite:  good    Visit on 10/13/22:  3 month f/u. Weight is up but wanted. Urinating well. No hospitalizations. Started on Amio by primary cardiologist     Activity: ADLs without limitation   Diet: Educated and following    Patient has:  Chest Pain: no  SOB: resolved   Orthopnea/PND: resolved OSA: no  Edema: resolved  Fatigue: resolved   Abdominal bloating: no  Cough: no  Appetite: good    Visit on 07/05/22: here today for her 3 month f/u. Weight is stable. Urinating well, has not needed her metolazone . In a-fib upon arrival today and is asymptomatic. Denies leg swelling, bloating, orthopnea, or PND. Following diet.       Visit on 03/30/22: 8 week f/u. Pt did see Dr. Gala and her Melene was increased with 2 week ECHO to decide on ICD. Her EF did improved to 35-40%. Since her last visit she has noted improved appetite. Denies SOB, leg swelling, bloating, and orthopnea.     Visit on 01/28/22:  8 week f/u. Pt is s/p her TEE and her 48hr holter monitor. She is thus far  tolerating her medications. She is getting repeat labs after her Farxiga  at the end of the month. Her TEE shows moderate MR with continued low EF. Her monitor shows sinus brady with some a-flutter - f/u w/ Hakim 2/14. Today she notes that since starting both the Entresto  and the Farxiga  she has noted less fatigue and less SOB. She denies leg swelling or orthopnea. Following her diet.     Visit on 12/28/21: here today as a new pt referred by Dr. Gala for management of her newly found systolic HF. Pt started with a new onset of Afib in 2016 found during a surgery. She has never had a cardioversion or an ablation done. She was started on an Eliquis  but did not tolerate it so it was stopped. She then suffered a CVA in 2018 with left sided deficits that have since resolved, she was started on coumadin  at this time. Dr. Herby ordered an ECHO in October d/t episodes of HTN, return of swelling, and SOB and was found to have an EF of 25-30%. Prior to the ECHO she was  started on metolazone  with improvement of her symptoms. She has been on lasix  for yrs with good urination. She also had noted orthopnea prior to her metolazone  use. She does follow a low Na/fluid diet.       Patient follows:      Hospitalization:              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 bladder, acquired     Thyroid disease     Removed      Past Surgical History:   Procedure Laterality Date    BLADDER SURGERY  03/11/2021    pessary    CARDIAC PROCEDURE Left 12/22/2021    Left heart cath / coronary angiography performed by Aurea Tawanna LABOR, MD at [facility] CARDIAC CATH LAB    EP DEVICE PROCEDURE N/A 04/04/2023    Insert PPM dual performed by Pink Cable, MD at [facility] CARDIAC CATH LAB    FEMUR SURGERY  12/2014    left femur    FRACTURE SURGERY      11/2022    SPINE SURGERY N/A 12/26/2022    KYPHOPLASTY S1-2 + T5 performed by Charolett Malkin, MD at [facility] OR    STIMULATOR SURGERY N/A 02/11/2020    STAGE 1 INTERSTIM performed by Mancel Bathe, MD at [facility] OR    STIMULATOR SURGERY N/A 02/25/2020    STAGE 2 STIMULATOR INSERTION performed by Mancel Bathe, MD at [facility] OR    THYROID SURGERY  2005    TRANSESOPHAGEAL ECHOCARDIOGRAM N/A 01/05/2022    TRANSESOPHAGEAL ECHOCARDIOGRAM performed by Aurea Tawanna LABOR, MD at [facility] ENDOSCOPY     History reviewed. No pertinent family history.  Social History     Tobacco Use    Smoking status: Former     Current packs/day: 0.00     Average packs/day: 1 pack/day for 4.1 years (4.1 ttl pk-yrs)     Types: Cigarettes     Start date: 01/1988     Quit date: 03/09/1992     Years since quitting: 31.6    Smokeless tobacco: Never  Substance Use Topics    Alcohol use: Not Currently     Current Outpatient Medications   Medication Sig Dispense Refill    spironolactone  (ALDACTONE ) 25  MG tablet Take 0.5 tablets by mouth daily 45 tablet 3    Coenzyme Q10 (COQ-10 PO) Take 1 capsule by mouth daily      furosemide  (LASIX ) 20 MG tablet Take 1 tablet by mouth daily 90 tablet 3    ferrous sulfate  (IRON 325) 325 (65 Fe) MG tablet Take 1 tablet by mouth daily (with breakfast) (Patient taking differently: Take 1 tablet by mouth See Admin Instructions Takes only on Sunday and Wednesday mornings)      D-Mannose 500 MG CAPS Take 500 mg by mouth daily      vitamin A 3 MG (10000 UT) capsule Take 1 capsule by mouth daily      sacubitril -valsartan  (ENTRESTO ) 49-51 MG per tablet Take 1 tablet by mouth 2 times daily 180 tablet 3    metoprolol  succinate (TOPROL  XL) 25 MG extended release tablet Take 1 tablet by mouth daily      FARXIGA  10 MG tablet TAKE ONE TABLET BY MOUTH EVERY MORNING 90 tablet 3    amiodarone  (CORDARONE ) 200 MG tablet Take 1 tablet by mouth daily      Multiple Vitamins-Minerals (THERAPEUTIC MULTIVITAMIN-MINERALS) tablet Take 1 tablet by mouth daily      Cholecalciferol (VITAMIN D3) 50 MCG (2000 UT) CAPS Take by mouth      Ascorbic Acid (VITAMIN C) 500 MG CHEW Take 500 mg by mouth daily      nitroGLYCERIN  (NITROSTAT ) 0.4 MG SL tablet up to max of 3 total doses. If no relief after 1 dose, call 911. 25 tablet 3    acetaminophen  (TYLENOL ) 500 MG tablet Take 1 tablet by mouth every 6 hours as needed for Pain      warfarin (COUMADIN ) 3 MG tablet Take 1 mg by mouth daily As directed      levothyroxine  (SYNTHROID ) 125 MCG tablet Take 1 tablet by mouth Daily      lidocaine  4 % external patch Place 2 patches onto the skin daily (Patient not taking: Reported on 10/26/2023)       No current facility-administered medications for this visit.     Allergies   Allergen Reactions    Eliquis  [Apixaban ] Hallucinations     Pt was shaking when taking     Xarelto [Rivaroxaban] Dizziness or Vertigo       SUBJECTIVE:   Review of Systems   Constitutional:  Negative for activity change, appetite change and fatigue.    Respiratory:  Negative for cough, shortness of breath and wheezing.    Cardiovascular:  Negative for chest pain, palpitations and leg swelling.   Gastrointestinal:  Negative for abdominal distention and abdominal pain.   Musculoskeletal:  Negative for gait problem.   Neurological:  Negative for weakness, light-headedness and headaches.   Psychiatric/Behavioral:  Negative for sleep disturbance.        OBJECTIVE:   Today's Vitals:  BP 130/70   Pulse 62   Ht 1.499 m (4' 11)   Wt 44 kg (97 lb)   SpO2 94%   BMI 19.59 kg/m     Physical Exam  Vitals reviewed.   Constitutional:       General: She is not in acute distress.     Appearance: Normal appearance. She is well-developed. She is not diaphoretic.   HENT:      Head: Normocephalic and atraumatic.  Eyes:      Conjunctiva/sclera: Conjunctivae normal.   Cardiovascular:      Rate and Rhythm: Normal rate and regular rhythm.      Heart sounds: Murmur (grade 3) heard.   Pulmonary:      Effort: Pulmonary effort is normal. No respiratory distress.      Breath sounds: Normal breath sounds. No wheezing, rhonchi or rales.      Comments: Diminished breath sounds bilaterally  Abdominal:      General: Bowel sounds are normal. There is no distension.      Palpations: Abdomen is soft.      Tenderness: There is no abdominal tenderness.   Musculoskeletal:         General: Normal range of motion.      Cervical back: Normal range of motion and neck supple.      Right lower leg: No edema.      Left lower leg: No edema.   Skin:     General: Skin is warm and dry.      Capillary Refill: Capillary refill takes less than 2 seconds.   Neurological:      Mental Status: She is alert and oriented to person, place, and time.      Coordination: Coordination normal.   Psychiatric:         Behavior: Behavior normal.         Wt Readings from Last 3 Encounters:   10/26/23 44 kg (97 lb)   07/20/23 43.2 kg (95 lb 3.2 oz)   05/25/23 44.9 kg (99 lb)     BP Readings from Last 3 Encounters:   10/26/23  130/70   07/20/23 (!) 160/72   05/25/23 118/62     Pulse Readings from Last 3 Encounters:   10/26/23 62   07/20/23 73   04/13/23 60     Body mass index is 19.59 kg/m.    ECHO:     Echo Findings 03/2023    Left Ventricle Normal left ventricular systolic function with a visually estimated EF of 55 - 60%. Left ventricle size is normal. Normal wall thickness. Normal wall motion. Abnormal diastolic function.   Left Atrium Left atrium is moderately dilated.   Right Ventricle Right ventricle size is normal. Normal systolic function.   Right Atrium Right atrium size is normal.   Aortic Valve Mildly thickened cusps. Moderately calcified cusps. Mild regurgitation. 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   Mitral Valve Mildly calcified leaflets. Mild regurgitation. No stenosis noted.   Tricuspid Valve Valve structure is normal. Mild regurgitation. No stenosis noted.   Pulmonic Valve The pulmonic valve visualization is suboptimal but appears to be functioning normally. Physiologically normal regurgitation. No stenosis noted.   Aorta Normal sized aortic root and ascending aorta.   IVC/Hepatic Veins IVC diameter is less than or equal to 21 mm and decreases greater than 50% during inspiration; therefore the estimated right atrial pressure is normal (~3 mmHg). IVC size is normal.   Pericardium No pericardial effusion.             CATH/STRESS:     Patent coronaries 2023      Results reviewed:  BNP: No results found for: BNP  CBC:   Lab Results   Component Value Date/Time    WBC 9.5 04/12/2023 04:19 PM    RBC 3.73 04/12/2023 04:19 PM    RBC 17 01/21/2020 09:08 AM    HGB 11.2 04/12/2023 04:19 PM    HCT 34.7 04/12/2023  04:19 PM    PLT 276 04/12/2023 04:19 PM     CMP:    Lab Results   Component Value Date/Time    NA 141 08/02/2023 11:10 AM    K 4.9 08/02/2023 11:10 AM    K 5.2 12/24/2022 03:27 AM    CL 103 08/02/2023 11:10 AM    CO2 22 08/02/2023 11:10 AM    BUN 54 08/02/2023 11:10 AM    CREATININE 1.1 08/02/2023 11:10 AM     LABGLOM 49 08/02/2023 11:10 AM    GLUCOSE 108 08/02/2023 11:10 AM    CALCIUM  9.3 08/02/2023 11:10 AM     Hepatic Function Panel:    Lab Results   Component Value Date/Time    ALKPHOS 125 04/01/2023 08:17 AM    ALT 15 04/01/2023 08:17 AM    AST 33 04/01/2023 08:17 AM    BILITOT 0.3 04/01/2023 08:17 AM    BILITOT Negative 01/21/2020 09:08 AM    BILIDIR 0.2 04/01/2023 08:17 AM     Magnesium :    Lab Results   Component Value Date/Time    MG 2.1 04/02/2023 08:05 AM     PT/INR:    Lab Results   Component Value Date/Time    INR 1.12 04/08/2023 05:30 AM     Lipids:    Lab Results   Component Value Date/Time    TRIG 107 04/11/2015 05:15 AM    HDL 64 04/11/2015 05:15 AM       ASSESSMENT AND PLAN:   The patient's condition/symptoms are stable        Diagnosis Orders   1. Persistent atrial fibrillation (HCC)        2. Nonischemic cardiomyopathy (HCC)  spironolactone  (ALDACTONE ) 25 MG tablet      3. Chronic systolic congestive heart failure, NYHA class 2, Stage C (HCC)  spironolactone  (ALDACTONE ) 25 MG tablet    XR CHEST (2 VW)      4. At risk for fluid volume overload                Continue:  GDMT:   ACE/ARB/ARNI - Entresto  49/51 BID    BB - toprol  25 daily   Diuretic -  Lasix  20mg  daily   AA - Aldactone  25 mg daily   SGLT2 -  Farxiga  10 daily   Vasodilator -   Other - coumadin , synthroid , amio 200 daily      HFimpEF 55-60% 20245 (35-40% 2024) (25-30% 2023)  Improved Nonischemic - valvular vs chronic ? 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     Stable, no lower leg swelling but noted labored breathing again today. Will order another chest xray. Murmur louder on exam today - ECHO is scheduled in November by primary cardiologist. I recommended we do this sooner. She states she does  not feel poor. Will make further changes based on chest xray.     Lab reviewed - K 4.9 Cr 1.1 mag 2.1 Hgb 11.2    ECHO 2025: DD, mod dilated LA, mild to mod AS, mild MR, mild TR RA ~ 3  CATH 2023: patent coronaries LVEDP 15    Chest xray today - changes from there      Continue diet/fluid adherence  Continue daily  wts.  F/U w/ Cardiology  F/U in clinic in 3 months      Tolerating above noted HF meds, no ill side effects noted. Will continue to monitor kidney function and electrolytes. Will optimize as tolerated.   Pt is compliant w/ medications.    Total visit time of  minutes has been spent with patient on education of symptoms, management, medication, and plan of care; as well as review of chart: labs, ECHO, radiology reports, etc.   I personally spent more then 50% of the appt time face to face with the patient.  Daily weights  Fluid restriction of 2 Liters per day  Limit sodium in diet to around 1500-2000 mg/day  Monitor BP  Activity as tolerated     Patient was instructed to call the Heart Failure Clinic for any changes in symptoms as noted in AVS.      Return in about 6 weeks (around 12/07/2023). or sooner if needed     Patient given educational materials - see patient instructions.   We discussed the importance of weighing oneself and recording daily. We also discussed the importance of a low sodium diet, higher sodium foods to avoid and better low sodium food options.   Patient verbalizes understanding of plan of care using teach back method, and is agreeable to the treatment plan.       Electronically signed by Sallyanne Moons, APRN - CNP on 10/26/2023 at 10:38 AM

## 2023-10-26 NOTE — Patient Instructions (Addendum)
 You may receive a survey regarding the care you received during your visit.  Your input is valuable to us .  We encourage you to complete and return your survey.  We hope you will choose us  in the future for your healthcare needs.    Your nurses today were [place] and [employer].  Office hours:   Mon-Thurs 8-4:30  Friday 8-12  Phone: [phone]    Continue:  Continue current medications  Daily weights and record  Fluid restriction of 2 Liters per day  Limit sodium in diet to around 1500-2000 mg/day  Monitor BP  Activity as tolerated     Call the Heart Failure Clinic for any of the following symptoms:   Weight gain of -3 pounds in 1 day or 5 pounds in 1 week  Increased shortness of breath  Shortness of breath while laying down  Cough  Chest pain  Swelling in feet, ankles or legs  Bloating in abdomen  Fatigue        Chest xray today - changes from there      Continue diet/fluid adherence  Continue daily wts.  F/U w/ Cardiology  F/U in clinic in 3 months

## 2023-10-27 NOTE — Telephone Encounter (Signed)
"  FYI-Pt husband [name] called , pt is scheduled for echo at [facility] today at 2:30pm     Michelle Bright   "

## 2023-10-30 NOTE — Telephone Encounter (Signed)
"  Reviewed no edema noted  "

## 2023-10-30 NOTE — Telephone Encounter (Signed)
"  Noted - did she not get a chest xray done?   "

## 2023-10-30 NOTE — Addendum Note (Signed)
"  Addended by: LUPE BENTON HERO on: 10/30/2023 04:08 PM     Modules accepted: Orders    "

## 2023-10-30 NOTE — Telephone Encounter (Signed)
"  Called to [facility] for CXR results  "

## 2023-10-30 NOTE — Telephone Encounter (Signed)
 CXR scanned in media

## 2023-12-04 ENCOUNTER — Ambulatory Visit
Admit: 2023-12-04 | Discharge: 2023-12-04 | Payer: Medicare (Managed Care) | Attending: Family | Primary: Family Medicine

## 2023-12-04 VITALS — BP 130/60 | HR 63 | Wt 89.6 lb

## 2023-12-04 DIAGNOSIS — I5022 Chronic systolic (congestive) heart failure: Principal | ICD-10-CM

## 2023-12-04 MED ORDER — FUROSEMIDE 20 MG PO TABS
20 | ORAL_TABLET | Freq: Every day | ORAL | 3 refills | 30.00000 days | Status: AC
Start: 2023-12-04 — End: ?

## 2023-12-04 NOTE — Patient Instructions (Addendum)
"  You may receive a survey regarding the care you received during your visit.  Your input is valuable to us .  We encourage you to complete and return your survey.  We hope you will choose us  in the future for your healthcare needs.    Your nurses today were Kristina and [employer].  Office hours:   Mon-Thurs 8-4:30  Friday 8-12  Phone: [phone]    Continue:  Continue current medications  Daily weights and record  Fluid restriction of 2 Liters per day  Limit sodium in diet to around 1500-2000 mg/day  Monitor BP  Activity as tolerated     Call the Heart Failure Clinic for any of the following symptoms:   Weight gain of -3 pounds in 1 day or 5 pounds in 1 week  Increased shortness of breath  Shortness of breath while laying down  Cough  Chest pain  Swelling in feet, ankles or legs  Bloating in abdomen  Fatigue          Start lasix  40mg  daily    Blood work in 2 weeks and today     Continue diet/fluid adherence  Continue daily wts.  F/U w/ Cardiology  F/U in clinic in 4 weeks  "

## 2023-12-04 NOTE — Progress Notes (Signed)
 "       Heart Failure Clinic       Visit Date: 12/04/2023  Cardiologist:  Dr. Theodosia - Shelvy Giovanni   Primary Care Physician: Dr. Heide Herder, MD    Michelle Bright is a 73 y.o. female who presents today for:  No chief complaint on file.      HPI:     TYPE HF: HFrEF 40% 10/2023 (55-60% 2025) (35-40% 2024) (25-30% 2023) (58% 2018)  Cause: improved nonischemic   Device: Dual PPM - bradycardia   HX: Pafib (2016), CVA  Dry Wt:  94 on 12/28/21, 92 on 01/28/22, 95 on 03/30/22, 91 on 07/05/22, 100 on 10/13/22, 96 on 04/13/23, 95 on 07/20/23, 97 on 10/26/23, 89 on 12/04/23      Michelle Bright is a 73 y.o. female who presents to the office for a f/u patient visit in the heart failure clinic.    Concerns today: 4 week f/u. Weight is down 8lbs. Her husband had left over metolazone  at 2.5mg  and he gave her a tab last week and then had HCTZ at home and gave her 12.5mg  EOD as well for a week d/t worsening lower leg swelling and worsening SOB. Symptoms improved with weight loss    Activity: ADLs without DOE   Diet: following    Patient has:  Chest Pain: no  SOB: yes worsening over the last couple of weeks to months  Orthopnea/PND: resolved   OSA: no  Edema: resolved with metolazone    Fatigue: no  Abdominal bloating: no  Cough: no  Appetite: early satiety     Visit on 10/26/23:  3 month f/u. Michelle Bright is stable. No hospitalizations for CHF.     Activity: ADLs without DOE   Diet: following    Patient has:  Chest Pain: no  SOB: yes worsening over the last couple of weeks to months  Orthopnea/PND: resolved   OSA: no  Edema: no  Fatigue: no  Abdominal bloating: no  Cough: no  Appetite: good      Visit on 07/19/23: 3 month f/u. Weight is stable. Her lasix  was stopped at some point. She notes labored breathing and more SOB with minimal exertion.     Activity: ADLs without limitation   Diet: ordered    Patient has:  Chest Pain: no  SOB: yes since hospitalization - worse without lasix   Orthopnea/PND: resolved OSA: no  Edema: resolved  Fatigue: worse  since stopping lasix     Abdominal bloating: no  Cough: no  Appetite: good    Visit on 04/13/23: her etoday for her 6 monght f/u. Recent admission for symptomatic bradycardia - documented CHB, now s/p PPM. In SNF for rehab. Repeat ECHO done showing improved EF as well. Continues on her lasix  20mg  w/ good response.     Activity: ADLs without limitation   Diet: ordered    Patient has:  Chest Pain: no  SOB: yes since hospitalization   Orthopnea/PND: resolved OSA: no  Edema: resolved  Fatigue: resolved   Abdominal bloating: no  Cough: no  Appetite: good    Visit on 10/13/22:  3 month f/u. Weight is up but wanted. Urinating well. No hospitalizations. Started on Amio by primary cardiologist     Activity: ADLs without limitation   Diet: Educated and following    Patient has:  Chest Pain: no  SOB: resolved   Orthopnea/PND: resolved OSA: no  Edema: resolved  Fatigue: resolved   Abdominal bloating: no  Cough: no  Appetite:  good    Visit on 07/05/22: here today for her 3 month f/u. Weight is stable. Urinating well, has not needed her metolazone . In a-fib upon arrival today and is asymptomatic. Denies leg swelling, bloating, orthopnea, or PND. Following diet.       Visit on 03/30/22: 8 week f/u. Pt did see Dr. Gala and her Melene was increased with 2 week ECHO to decide on ICD. Her EF did improved to 35-40%. Since her last visit she has noted improved appetite. Denies SOB, leg swelling, bloating, and orthopnea.     Visit on 01/28/22:  8 week f/u. Pt is s/p her TEE and her 48hr holter monitor. She is thus far tolerating her medications. She is getting repeat labs after her Farxiga  at the end of the month. Her TEE shows moderate MR with continued low EF. Her monitor shows sinus brady with some a-flutter - f/u w/ Hakim 2/14. Today she notes that since starting both the Entresto  and the Farxiga  she has noted less fatigue and less SOB. She denies leg swelling or orthopnea. Following her diet.     Visit on 12/28/21: here today as a new pt  referred by Dr. Gala for management of her newly found systolic HF. Pt started with a new onset of Afib in 2016 found during a surgery. She has never had a cardioversion or an ablation done. She was started on an Eliquis  but did not tolerate it so it was stopped. She then suffered a CVA in 2018 with left sided deficits that have since resolved, she was started on coumadin  at this time. Dr. Herby ordered an ECHO in October d/t episodes of HTN, return of swelling, and SOB and was found to have an EF of 25-30%. Prior to the ECHO she was started on metolazone  with improvement of her symptoms. She has been on lasix  for yrs with good urination. She also had noted orthopnea prior to her metolazone  use. She does follow a low Na/fluid diet.       Patient follows:      Hospitalization:              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 bladder, acquired     Thyroid disease     Removed      Past Surgical History:   Procedure Laterality Date    BLADDER SURGERY  03/11/2021    pessary    CARDIAC PROCEDURE Left 12/22/2021    Left heart cath / coronary angiography performed by Aurea Tawanna LABOR, MD at [facility] CARDIAC CATH LAB    EP DEVICE PROCEDURE N/A 04/04/2023    Insert PPM dual performed by Pink Cable, MD at [facility] CARDIAC CATH LAB    FEMUR SURGERY  12/2014    left femur    FRACTURE SURGERY      11/2022    SPINE SURGERY N/A 12/26/2022    KYPHOPLASTY S1-2 + T5 performed by Charolett Malkin, MD at [facility] OR    STIMULATOR SURGERY N/A 02/11/2020    STAGE 1 INTERSTIM performed by Mancel Bathe, MD at [facility] OR    STIMULATOR SURGERY N/A 02/25/2020    STAGE 2 STIMULATOR INSERTION  performed by Mancel Bathe, MD at [facility] OR    THYROID SURGERY  2005    TRANSESOPHAGEAL ECHOCARDIOGRAM N/A 01/05/2022     TRANSESOPHAGEAL ECHOCARDIOGRAM performed by Aurea Tawanna LABOR, MD at [facility] ENDOSCOPY     History reviewed. No pertinent family history.  Social History     Tobacco Use    Smoking status: Former     Current packs/day: 0.00     Average packs/day: 1 pack/day for 4.1 years (4.1 ttl pk-yrs)     Types: Cigarettes     Start date: 01/1988     Quit date: 03/09/1992     Years since quitting: 31.7    Smokeless tobacco: Never   Substance Use Topics    Alcohol use: Not Currently     Current Outpatient Medications   Medication Sig Dispense Refill    furosemide  (LASIX ) 20 MG tablet Take 2 tablets by mouth daily 180 tablet 3    spironolactone  (ALDACTONE ) 25 MG tablet Take 0.5 tablets by mouth daily 45 tablet 3    Coenzyme Q10 (COQ-10 PO) Take 1 capsule by mouth daily      D-Mannose 500 MG CAPS Take 500 mg by mouth daily      vitamin A 3 MG (10000 UT) capsule Take 1 capsule by mouth daily      sacubitril -valsartan  (ENTRESTO ) 49-51 MG per tablet Take 1 tablet by mouth 2 times daily 180 tablet 3    metoprolol  succinate (TOPROL  XL) 25 MG extended release tablet Take 1 tablet by mouth daily      lidocaine  4 % external patch Place 2 patches onto the skin daily (Patient taking differently: Place 2 patches onto the skin daily as needed)      FARXIGA  10 MG tablet TAKE ONE TABLET BY MOUTH EVERY MORNING 90 tablet 3    amiodarone  (CORDARONE ) 200 MG tablet Take 1 tablet by mouth daily      Multiple Vitamins-Minerals (THERAPEUTIC MULTIVITAMIN-MINERALS) tablet Take 1 tablet by mouth daily      Cholecalciferol (VITAMIN D3) 50 MCG (2000 UT) CAPS Take by mouth      Ascorbic Acid (VITAMIN C) 500 MG CHEW Take 500 mg by mouth daily      acetaminophen  (TYLENOL ) 500 MG tablet Take 1 tablet by mouth every 6 hours as needed for Pain      warfarin (COUMADIN ) 3 MG tablet Take 1 mg by mouth daily As directed      levothyroxine  (SYNTHROID ) 125 MCG tablet Take 1 tablet by mouth Daily      ferrous sulfate  (IRON 325) 325 (65 Fe) MG tablet 1  tablet Sunday and Wed      nitroGLYCERIN  (NITROSTAT ) 0.4 MG SL tablet up to max of 3 total doses. If no relief after 1 dose, call 911. 25 tablet 3     No current facility-administered medications for this visit.     Allergies   Allergen Reactions    Eliquis  [Apixaban ] Hallucinations     Pt was shaking when taking     Xarelto [Rivaroxaban] Dizziness or Vertigo       SUBJECTIVE:   Review of Systems   Constitutional:  Negative for activity change, appetite change and fatigue.   Respiratory:  Negative for cough, shortness of breath and wheezing.    Cardiovascular:  Negative for chest pain, palpitations and leg swelling.   Gastrointestinal:  Negative for abdominal distention and abdominal pain.   Musculoskeletal:  Negative for gait problem.   Neurological:  Negative for weakness, light-headedness and  headaches.   Psychiatric/Behavioral:  Negative for sleep disturbance.        OBJECTIVE:   Today's Vitals:  BP 130/60   Pulse 63   Wt 40.6 kg (89 lb 9.6 oz)   SpO2 93%   BMI 18.10 kg/m     Physical Exam  Vitals reviewed.   Constitutional:       General: She is not in acute distress.     Appearance: Normal appearance. She is well-developed. She is not diaphoretic.   HENT:      Head: Normocephalic and atraumatic.   Eyes:      Conjunctiva/sclera: Conjunctivae normal.   Cardiovascular:      Rate and Rhythm: Normal rate and regular rhythm.      Heart sounds: Murmur (grade 3) heard.   Pulmonary:      Effort: Pulmonary effort is normal. No respiratory distress.      Breath sounds: Normal breath sounds. No wheezing, rhonchi or rales.      Comments: Diminished breath sounds bilaterally  Abdominal:      General: Bowel sounds are normal. There is no distension.      Palpations: Abdomen is soft.      Tenderness: There is no abdominal tenderness.   Musculoskeletal:         General: Normal range of motion.      Cervical back: Normal range of motion and neck supple.      Right lower leg: No edema.      Left lower leg: No edema.    Skin:     General: Skin is warm and dry.      Capillary Refill: Capillary refill takes less than 2 seconds.   Neurological:      Mental Status: She is alert and oriented to person, place, and time.      Coordination: Coordination normal.   Psychiatric:         Behavior: Behavior normal.         Wt Readings from Last 3 Encounters:   12/04/23 40.6 kg (89 lb 9.6 oz)   10/26/23 44 kg (97 lb)   07/20/23 43.2 kg (95 lb 3.2 oz)     BP Readings from Last 3 Encounters:   12/04/23 130/60   10/26/23 130/70   07/20/23 (!) 160/72     Pulse Readings from Last 3 Encounters:   12/04/23 63   10/26/23 62   07/20/23 73     Body mass index is 18.1 kg/m.    ECHO:               CATH/STRESS:     Patent coronaries 2023      Results reviewed:  BNP: No results found for: BNP  CBC:   Lab Results   Component Value Date/Time    WBC 9.5 04/12/2023 04:19 PM    RBC 3.73 04/12/2023 04:19 PM    RBC 17 01/21/2020 09:08 AM    HGB 11.2 04/12/2023 04:19 PM    HCT 34.7 04/12/2023 04:19 PM    PLT 276 04/12/2023 04:19 PM     CMP:    Lab Results   Component Value Date/Time    NA 141 08/02/2023 11:10 AM    K 4.9 08/02/2023 11:10 AM    K 5.2 12/24/2022 03:27 AM    CL 103 08/02/2023 11:10 AM    CO2 22 08/02/2023 11:10 AM    BUN 54 08/02/2023 11:10 AM    CREATININE 1.1 08/02/2023 11:10 AM    LABGLOM  49 08/02/2023 11:10 AM    GLUCOSE 108 08/02/2023 11:10 AM    CALCIUM  9.3 08/02/2023 11:10 AM     Hepatic Function Panel:    Lab Results   Component Value Date/Time    ALKPHOS 125 04/01/2023 08:17 AM    ALT 15 04/01/2023 08:17 AM    AST 33 04/01/2023 08:17 AM    BILITOT 0.3 04/01/2023 08:17 AM    BILITOT Negative 01/21/2020 09:08 AM    BILIDIR 0.2 04/01/2023 08:17 AM     Magnesium :    Lab Results   Component Value Date/Time    MG 2.1 04/02/2023 08:05 AM     PT/INR:    Lab Results   Component Value Date/Time    INR 1.12 04/08/2023 05:30 AM     Lipids:    Lab Results   Component Value Date/Time    TRIG 107 04/11/2015 05:15 AM    HDL 64 04/11/2015 05:15 AM        ASSESSMENT AND PLAN:   The patient's condition/symptoms are stable        Diagnosis Orders   1. 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 Persistent atrial fibrillation (HCC)        3. Nonischemic cardiomyopathy (HCC)        4. At risk for fluid volume overload                  Continue:  GDMT:   ACE/ARB/ARNI - Entresto  49/51 BID    BB - toprol  25 daily   Diuretic -  Lasix  40mg  daily   AA - Aldactone  25 mg daily   SGLT2 -  Farxiga  10 daily   Vasodilator -   Other - coumadin , synthroid , amio 200 daily      HFrEF 40% 2025 (55-60% 03/2023) (35-40% 2024) (25-30% 2023)  Nonischemic - valvular vs chronic ? 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     Stable, no lower leg swelling but noted labored breathing again today. Dr. Theodosia next week. Start lasix  40mg  daily. BMP in 2 weeks. 4 week f/u. Discussed possible need for TEE d/t worsening CHF and moderate MR noted on ECHO - pt does not want further testing at this time. May need to consider palliative care - will discuss further after she sees her primary cardiologist.     Lab reviewed - K 4.9 Cr 1.1 mag 2.1 Hgb 11.2    ECHO 2025: mild - mod LA dilation, Moderate MR, mild AS/AI, moderate TR  CATH 2023: patent coronaries LVEDP 15    Start lasix  40mg  daily    Blood work in 2 weeks and today     Continue diet/fluid adherence  Continue daily wts.  F/U w/ Cardiology  F/U in clinic in 4 weeks      Tolerating above noted HF meds, no ill side effects noted. Will continue to monitor kidney function and electrolytes. Will optimize as tolerated.   Pt is  compliant w/ medications.  Total visit time of  minutes has been spent with patient on education of symptoms, management, medication, and plan of care; as well as review of chart: labs, ECHO, radiology reports, etc.   I personally spent more then 50% of the appt time face to face with the patient.  Daily weights  Fluid restriction of 2 Liters per day  Limit sodium in diet to around 1500-2000 mg/day  Monitor BP  Activity as tolerated     Patient was instructed to call the Heart Failure Clinic for any changes in symptoms as noted in AVS.      Return in about 4 weeks (around 01/01/2024). or sooner if needed     Patient given educational materials - see patient instructions.   We discussed the importance of weighing oneself and recording daily. We also discussed the importance of a low sodium diet, higher sodium foods to avoid and better low sodium food options.   Patient verbalizes understanding of plan of care using teach back method, and is agreeable to the treatment plan.       Electronically signed by Sallyanne Moons, APRN - CNP on 12/04/2023 at 1:33 PM          "

## 2023-12-05 LAB — CBC
Basophils Absolute: 0.1 /uL
Eosinophils %: 1.4 %
Hematocrit: 50.1 % — ABNORMAL HIGH (ref 36–46)
Lymphocytes %: 15.3 %
MCH: 28.8 pg
MCHC: 29.9 g/dL — ABNORMAL LOW
Monocytes Absolute: 0.7 /uL
Neutrophils %: 73 %
Neutrophils Absolute: 5.3 /uL
WBC: 7.3 10*3/mL

## 2023-12-05 LAB — BASIC METABOLIC PANEL
Calcium: 9.5 mg/dL
Creatinine: 0.8 mg/dL
Potassium: 4.6 mmol/L
Sodium: 145 mmol/L

## 2023-12-05 LAB — IRON AND TIBC: Iron: 62

## 2023-12-05 LAB — IRON SATURATION: Iron % Saturation: 23

## 2023-12-06 NOTE — Telephone Encounter (Signed)
"  Ferritin level is high - please have her f/u with PCP for further evaluation   "

## 2023-12-06 NOTE — Result Encounter Note (Signed)
"  Spoke to pt husband [name] and advised  "

## 2023-12-07 ENCOUNTER — Encounter

## 2023-12-07 MED ORDER — DAPAGLIFLOZIN PROPANEDIOL 10 MG PO TABS
10 | ORAL_TABLET | Freq: Every morning | ORAL | 3 refills | 90.00000 days | Status: AC
Start: 2023-12-07 — End: ?

## 2023-12-07 NOTE — Telephone Encounter (Signed)
"  Received prior auth approval for Farxiga .    [employer] and spoke to [place]. They said they are shipping out the medication.    Called the patient and they were informed that the medication will be shipped to their house.   "

## 2023-12-18 LAB — BASIC METABOLIC PANEL
BUN: 26 mg/dL — ABNORMAL HIGH
CO2: 28 mmol/L
Calcium: 8.9 mg/dL
Chloride: 102 mmol/L
Creatinine: 0.9 mg/dL
Est, Glom Filt Rate: 63
Glucose: 211 mg/dL — ABNORMAL HIGH
Potassium: 4.2 mmol/L
Sodium: 143 mmol/L

## 2024-01-01 NOTE — Telephone Encounter (Signed)
 noted

## 2024-01-01 NOTE — Telephone Encounter (Signed)
"  Patient had to cancel CHF appt- husband hurt his leg and unable to transport. He will call back to r/s when able to get out  "

## 2024-01-03 ENCOUNTER — Encounter: Payer: Medicare (Managed Care) | Attending: Family | Primary: Family Medicine

## 2024-01-15 NOTE — Telephone Encounter (Signed)
"  Called to home #   No answer   [facility] phone just rings  "

## 2024-01-15 NOTE — Telephone Encounter (Signed)
"  Call back from husband [name], got appt r/s  "

## 2024-02-05 ENCOUNTER — Ambulatory Visit
Admit: 2024-02-05 | Discharge: 2024-02-05 | Payer: Medicare (Managed Care) | Attending: Family | Primary: Family Medicine

## 2024-02-05 VITALS — BP 138/64 | HR 61 | Wt 90.0 lb

## 2024-02-05 DIAGNOSIS — I5022 Chronic systolic (congestive) heart failure: Principal | ICD-10-CM

## 2024-02-05 MED ORDER — SACUBITRIL-VALSARTAN 49-51 MG PO TABS
49-51 | ORAL_TABLET | Freq: Two times a day (BID) | ORAL | 3 refills | 30.00000 days | Status: DC
Start: 2024-02-05 — End: 2024-02-19

## 2024-02-05 NOTE — Patient Instructions (Addendum)
 You may receive a survey regarding the care you received during your visit.  Your input is valuable to us .  We encourage you to complete and return your survey.  We hope you will choose us  in the future for your healthcare needs.    Your nurses today were Kristina and [employer].  Office hours:   Mon-Thurs 8-4:30  Friday 8-12  Phone: [phone]    Continue:  Continue current medications  Daily weights and record  Fluid restriction of 2 Liters per day  Limit sodium in diet to around 1500-2000 mg/day  Monitor BP  Activity as tolerated     Call the Heart Failure Clinic for any of the following symptoms:   Weight gain of -3 pounds in 1 day or 5 pounds in 1 week  Increased shortness of breath  Shortness of breath while laying down  Cough  Chest pain  Swelling in feet, ankles or legs  Bloating in abdomen  Fatigue        Recommending a TEE to look at mitral and tricuspid valve     No medication changes today     Continue diet/fluid adherence  Continue daily wts.  F/U w/ Cardiology  F/U in clinic in 8 weeks

## 2024-02-05 NOTE — Progress Notes (Signed)
 Heart Failure Clinic       Visit Date: 02/05/2024  Cardiologist:  Dr. Theodosia - Deitra. Bright   Primary Care Physician: Dr. Heide Herder, MD    Michelle Bright is a 74 y.o. female who presents today for:  Chief Complaint   Patient presents with    1 Month Follow-Up    Congestive Heart Failure       HPI:     TYPE HF: HFrEF 40% 10/2023 (55-60% 2025) (35-40% 2024) (25-30% 2023) (58% 2018)  Cause: improved nonischemic   Device: Dual PPM - bradycardia   HX: Pafib (2016), CVA  Dry Wt:  94 on 12/28/21, 92 on 01/28/22, 95 on 03/30/22, 91 on 07/05/22, 100 on 10/13/22, 96 on 04/13/23, 95 on 07/20/23, 97 on 10/26/23, 89 on 12/04/23, 90 on 02/05/24      Michelle Bright is a 74 y.o. female who presents to the office for a f/u patient visit in the heart failure clinic.    Concerns today: 4 week f/u. Weight is stable. Had doubled her bumex  last visit with improvement of her SOB. She is working on eating more, energy level is a little better about 4 weeks ago her husband decreased her lasix  to 20mg  daily on his own because she was up urinating too much throughout the night. He only gives the 40mg  about twice a week.     Activity: ADLs without DOE   Diet: following    Patient has:  Chest Pain: no  SOB: yes chronic - improved on higher dose of lasix   Orthopnea/PND: resolved   OSA: no  Edema: resolved   Fatigue: no  Abdominal bloating: no  Cough: no  Appetite: early satiety     Visit on 12/04/23: 4 week f/u. Weight is down 8lbs. Her husband had left over metolazone  at 2.5mg  and he gave her a tab last week and then had HCTZ at home and gave her 12.5mg  EOD as well for a week d/t worsening lower leg swelling and worsening SOB. Symptoms improved with weight loss    Activity: ADLs without DOE   Diet: following    Patient has:  Chest Pain: no  SOB: yes worsening over the last couple of weeks to months  Orthopnea/PND: resolved   OSA: no  Edema: resolved with metolazone    Fatigue: no  Abdominal bloating: no  Cough: no  Appetite: early satiety      Visit on 10/26/23:  3 month f/u. [name] is stable. No hospitalizations for CHF.     Activity: ADLs without DOE   Diet: following    Patient has:  Chest Pain: no  SOB: yes worsening over the last couple of weeks to months  Orthopnea/PND: resolved   OSA: no  Edema: no  Fatigue: no  Abdominal bloating: no  Cough: no  Appetite: good      Visit on 07/19/23: 3 month f/u. Weight is stable. Her lasix  was stopped at some point. She notes labored breathing and more SOB with minimal exertion.     Activity: ADLs without limitation   Diet: ordered    Patient has:  Chest Pain: no  SOB: yes since hospitalization - worse without lasix   Orthopnea/PND: resolved OSA: no  Edema: resolved  Fatigue: worse since stopping lasix     Abdominal bloating: no  Cough: no  Appetite: good    Visit on 04/13/23: her etoday for her 6 monght f/u. Recent admission for symptomatic bradycardia - documented CHB, now s/p PPM. In SNF for rehab.  Repeat ECHO done showing improved EF as well. Continues on her lasix  20mg  w/ good response.     Activity: ADLs without limitation   Diet: ordered    Patient has:  Chest Pain: no  SOB: yes since hospitalization   Orthopnea/PND: resolved OSA: no  Edema: resolved  Fatigue: resolved   Abdominal bloating: no  Cough: no  Appetite: good    Visit on 10/13/22:  3 month f/u. Weight is up but wanted. Urinating well. No hospitalizations. Started on Amio by primary cardiologist     Activity: ADLs without limitation   Diet: Educated and following    Patient has:  Chest Pain: no  SOB: resolved   Orthopnea/PND: resolved OSA: no  Edema: resolved  Fatigue: resolved   Abdominal bloating: no  Cough: no  Appetite: good    Visit on 07/05/22: here today for her 3 month f/u. Weight is stable. Urinating well, has not needed her metolazone . In a-fib upon arrival today and is asymptomatic. Denies leg swelling, bloating, orthopnea, or PND. Following diet.       Visit on 03/30/22: 8 week f/u. Pt did see Dr. Gala and her Melene was increased with 2  week ECHO to decide on ICD. Her EF did improved to 35-40%. Since her last visit she has noted improved appetite. Denies SOB, leg swelling, bloating, and orthopnea.     Visit on 01/28/22:  8 week f/u. Pt is s/p her TEE and her 48hr holter monitor. She is thus far tolerating her medications. She is getting repeat labs after her Farxiga  at the end of the month. Her TEE shows moderate MR with continued low EF. Her monitor shows sinus brady with some a-flutter - f/u w/ Hakim 2/14. Today she notes that since starting both the Entresto  and the Farxiga  she has noted less fatigue and less SOB. She denies leg swelling or orthopnea. Following her diet.     Visit on 12/28/21: here today as a new pt referred by Dr. Gala for management of her newly found systolic HF. Pt started with a new onset of Afib in 2016 found during a surgery. She has never had a cardioversion or an ablation done. She was started on an Eliquis  but did not tolerate it so it was stopped. She then suffered a CVA in 2018 with left sided deficits that have since resolved, she was started on coumadin  at this time. Dr. Herby ordered an ECHO in October d/t episodes of HTN, return of swelling, and SOB and was found to have an EF of 25-30%. Prior to the ECHO she was started on metolazone  with improvement of her symptoms. She has been on lasix  for yrs with good urination. She also had noted orthopnea prior to her metolazone  use. She does follow a low Na/fluid diet.       Patient follows:      Hospitalization:              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 bladder, acquired     Thyroid disease     Removed      Past Surgical History:   Procedure Laterality Date    BLADDER  SURGERY  03/11/2021    pessary    CARDIAC PROCEDURE Left 12/22/2021     Left heart cath / coronary angiography performed by Aurea Tawanna LABOR, MD at [facility] CARDIAC CATH LAB    EP DEVICE PROCEDURE N/A 04/04/2023    Insert PPM dual performed by Pink Cable, MD at [facility] CARDIAC CATH LAB    FEMUR SURGERY  12/2014    left femur    FRACTURE SURGERY      11/2022    SPINE SURGERY N/A 12/26/2022    KYPHOPLASTY S1-2 + T5 performed by Charolett Malkin, MD at [facility] OR    STIMULATOR SURGERY N/A 02/11/2020    STAGE 1 INTERSTIM performed by Mancel Bathe, MD at [facility] OR    STIMULATOR SURGERY N/A 02/25/2020    STAGE 2 STIMULATOR INSERTION performed by Mancel Bathe, MD at [facility] OR    THYROID SURGERY  2005    TRANSESOPHAGEAL ECHOCARDIOGRAM N/A 01/05/2022    TRANSESOPHAGEAL ECHOCARDIOGRAM performed by Aurea Tawanna LABOR, MD at [facility] ENDOSCOPY     History reviewed. No pertinent family history.  Social History     Tobacco Use    Smoking status: Former     Current packs/day: 0.00     Average packs/day: 1 pack/day for 4.1 years (4.1 ttl pk-yrs)     Types: Cigarettes     Start date: 01/1988     Quit date: 03/09/1992     Years since quitting: 31.9    Smokeless tobacco: Never   Substance Use Topics    Alcohol use: Not Currently     Current Outpatient Medications   Medication Sig Dispense Refill    sacubitril -valsartan  (ENTRESTO ) 49-51 MG per tablet Take 1 tablet by mouth 2 times daily 180 tablet 3    dapagliflozin  (FARXIGA ) 10 MG tablet Take 1 tablet by mouth every morning 90 tablet 3    furosemide  (LASIX ) 20 MG tablet Take 2 tablets by mouth daily 180 tablet 3    spironolactone  (ALDACTONE ) 25 MG tablet Take 0.5 tablets by mouth daily 45 tablet 3    Coenzyme Q10 (COQ-10 PO) Take 1 capsule by mouth daily      ferrous sulfate  (IRON 325) 325 (65 Fe) MG tablet 1 tablet Sunday and Wed      D-Mannose 500 MG CAPS Take 500 mg by mouth daily      vitamin A 3 MG (10000 UT) capsule Take 1 capsule by mouth daily      metoprolol  succinate (TOPROL  XL) 25 MG extended release tablet Take 1 tablet by mouth daily      amiodarone  (CORDARONE ) 200  MG tablet Take 1 tablet by mouth daily      Multiple Vitamins-Minerals (THERAPEUTIC MULTIVITAMIN-MINERALS) tablet Take 1 tablet by mouth daily      Cholecalciferol (VITAMIN D3) 50 MCG (2000 UT) CAPS Take by mouth      Ascorbic Acid (VITAMIN C) 500 MG CHEW Take 500 mg by mouth daily      nitroGLYCERIN  (NITROSTAT ) 0.4 MG SL tablet up to max of 3 total doses. If no relief after 1 dose, call 911. 25 tablet 3    acetaminophen  (TYLENOL ) 500 MG tablet Take 1 tablet by mouth every 6 hours as needed for Pain      warfarin (COUMADIN ) 3 MG tablet Take 1 mg by mouth daily As directed      levothyroxine  (SYNTHROID ) 125 MCG tablet Take 1 tablet by mouth Daily      lidocaine  4 % external patch Place 2 patches  onto the skin daily (Patient taking differently: Place 2 patches onto the skin daily as needed)       No current facility-administered medications for this visit.     Allergies   Allergen Reactions    Eliquis  [Apixaban ] Hallucinations     Pt was shaking when taking     Xarelto [Rivaroxaban] Dizziness or Vertigo       SUBJECTIVE:   Review of Systems   Constitutional:  Negative for activity change, appetite change and fatigue.   Respiratory:  Negative for cough, shortness of breath and wheezing.    Cardiovascular:  Negative for chest pain, palpitations and leg swelling.   Gastrointestinal:  Negative for abdominal distention and abdominal pain.   Musculoskeletal:  Negative for gait problem.   Neurological:  Negative for weakness, light-headedness and headaches.   Psychiatric/Behavioral:  Negative for sleep disturbance.        OBJECTIVE:   Today's Vitals:  BP 138/64   Pulse 61   Wt 40.8 kg (90 lb)   SpO2 92%   BMI 18.18 kg/m     Physical Exam  Vitals reviewed.   Constitutional:       General: She is not in acute distress.     Appearance: Normal appearance. She is well-developed. She is not diaphoretic.   HENT:      Head: Normocephalic and atraumatic.   Eyes:      Conjunctiva/sclera: Conjunctivae normal.   Cardiovascular:       Rate and Rhythm: Normal rate and regular rhythm.      Heart sounds: Murmur (grade 3) heard.   Pulmonary:      Effort: Pulmonary effort is normal. No respiratory distress.      Breath sounds: Normal breath sounds. No wheezing, rhonchi or rales.      Comments: Diminished breath sounds bilaterally  Abdominal:      General: Bowel sounds are normal. There is no distension.      Palpations: Abdomen is soft.      Tenderness: There is no abdominal tenderness.   Musculoskeletal:         General: Normal range of motion.      Cervical back: Normal range of motion and neck supple.      Right lower leg: No edema.      Left lower leg: No edema.   Skin:     General: Skin is warm and dry.      Capillary Refill: Capillary refill takes less than 2 seconds.   Neurological:      Mental Status: She is alert and oriented to person, place, and time.      Coordination: Coordination normal.   Psychiatric:         Behavior: Behavior normal.         Wt Readings from Last 3 Encounters:   02/05/24 40.8 kg (90 lb)   12/04/23 40.6 kg (89 lb 9.6 oz)   10/26/23 44 kg (97 lb)     BP Readings from Last 3 Encounters:   02/05/24 138/64   12/04/23 130/60   10/26/23 130/70     Pulse Readings from Last 3 Encounters:   02/05/24 61   12/04/23 63   10/26/23 62     Body mass index is 18.18 kg/m.    ECHO:               CATH/STRESS:     Patent coronaries 2023      Results reviewed:  BNP: No results found for: BNP  CBC:   Lab Results   Component Value Date/Time    WBC 7.3 12/05/2023 03:28 PM    RBC 5.21 12/05/2023 03:28 PM    RBC 17 01/21/2020 09:08 AM    HGB 15 12/05/2023 03:28 PM    HCT 50.1 12/05/2023 03:28 PM    PLT 198 12/05/2023 03:28 PM     CMP:    Lab Results   Component Value Date/Time    NA 143 12/18/2023 08:13 AM    K 4.2 12/18/2023 08:13 AM    K 5.2 12/24/2022 03:27 AM    CL 102 12/18/2023 08:13 AM    CO2 28 12/18/2023 08:13 AM    BUN 26 12/18/2023 08:13 AM    CREATININE 0.9 12/18/2023 08:13 AM    LABGLOM 63 12/18/2023 08:13 AM    GLUCOSE 211  12/18/2023 08:13 AM    CALCIUM  8.9 12/18/2023 08:13 AM     Hepatic Function Panel:    Lab Results   Component Value Date/Time    ALKPHOS 125 04/01/2023 08:17 AM    ALT 15 04/01/2023 08:17 AM    AST 33 04/01/2023 08:17 AM    BILITOT 0.3 04/01/2023 08:17 AM    BILITOT Negative 01/21/2020 09:08 AM    BILIDIR 0.2 04/01/2023 08:17 AM     Magnesium :    Lab Results   Component Value Date/Time    MG 1.9 12/05/2023 03:31 PM     PT/INR:    Lab Results   Component Value Date/Time    INR 1.12 04/08/2023 05:30 AM     Lipids:    Lab Results   Component Value Date/Time    TRIG 107 04/11/2015 05:15 AM    HDL 64 04/11/2015 05:15 AM       ASSESSMENT AND PLAN:   The patient's condition/symptoms are stable        Diagnosis Orders   1. 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 Nonischemic cardiomyopathy (HCC)  sacubitril -valsartan  (ENTRESTO ) 49-51 MG per tablet      3. Persistent atrial fibrillation (HCC)        4. At risk for fluid volume overload                  Continue:  GDMT:   ACE/ARB/ARNI - Entresto  49/51 BID    BB - toprol  25 daily   Diuretic -  Lasix  40mg  daily   AA - Aldactone  25 mg daily   SGLT2 -  Farxiga  10 daily   Vasodilator -   Other - coumadin , synthroid , amio 200 daily      HFrEF 40% 2025 (55-60% 03/2023) (35-40% 2024) (25-30% 2023)  Nonischemic - valvular vs chronic ? 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     Stable, no lower leg swelling, continues with labored breathing. Dr. Herby did review her last ECHO and had no further recommendations. I am recommending at TEE to look at mitral valve. Will continue on higher dose of lasix  as it has helped with symptoms.     Lab reviewed - K 4.2 Cr 0.9 mag 2.1 Hgb 15    ECHO 2025: mild - mod  LA dilation, Moderate MR, mild AS/AI, moderate TR  CATH 2023: patent coronaries LVEDP 15    Recommending a TEE to look at mitral and tricuspid valve     No medication changes today     Continue diet/fluid adherence  Continue daily wts.  F/U w/ Cardiology  F/U in clinic in 8 weeks      Tolerating above noted HF meds, no ill side effects noted. Will continue to monitor kidney function and electrolytes. Will optimize as tolerated.   Pt is compliant w/ medications.    Total visit time of  minutes has been spent with patient on education of symptoms, management, medication, and plan of care; as well as review of chart: labs, ECHO, radiology reports, etc.   I personally spent more then 50% of the appt time face to face with the patient.  Daily weights  Fluid restriction of 2 Liters per day  Limit sodium in diet to around 1500-2000 mg/day  Monitor BP  Activity as tolerated     Patient was instructed to call the Heart Failure Clinic for any changes in symptoms as noted in AVS.      No follow-ups on file. or sooner if needed     Patient given educational materials - see patient instructions.   We discussed the importance of weighing oneself and recording daily. We also discussed the importance of a low sodium diet, higher sodium foods to avoid and better low sodium food options.   Patient verbalizes understanding of plan of care using teach back method, and is agreeable to the treatment plan.       Electronically signed by Sallyanne Moons, APRN - CNP on 02/05/2024 at 11:56 AM

## 2024-02-08 ENCOUNTER — Telehealth

## 2024-02-08 NOTE — Telephone Encounter (Signed)
"  Pt has severe MR/TR - TEE with Dr Tobie.   "

## 2024-02-12 NOTE — Telephone Encounter (Signed)
 Dr Tobie-    Schedule patient for a right and left with TEE? If no MR cancel heart cath?

## 2024-02-13 NOTE — Telephone Encounter (Signed)
 Yes lets do that   Ill do TEE first

## 2024-02-13 NOTE — Telephone Encounter (Signed)
 L/R Heart Cath with TEE scheduled 03/04/2024 at 12:00 pm, with Dr. Tobie. Patient arrival time of 10:00 am. Spoke to [facility]. Placed on provider's schedule. Order faxed.      L/R Heart Cath submitted through [employer] - APPROVED  Valid: 02/13/2024 - 05/12/2024        TEE submitted through [employer] - APPROVED  Valid: 02/13/2024 - 05/12/2024

## 2024-02-14 ENCOUNTER — Encounter

## 2024-02-14 NOTE — Telephone Encounter (Signed)
 Instructions given to patient's spouse, [name]. No questions at this time.    Last dose of Coumadin  2/3. Patient on Coumadin  for hx of Afib.

## 2024-02-19 ENCOUNTER — Encounter

## 2024-02-20 MED ORDER — SACUBITRIL-VALSARTAN 49-51 MG PO TABS
49-51 | ORAL_TABLET | Freq: Two times a day (BID) | ORAL | 3 refills | Status: AC
Start: 2024-02-20 — End: ?

## 2024-03-04 ENCOUNTER — Inpatient Hospital Stay: Payer: Medicare (Managed Care)

## 2024-03-04 ENCOUNTER — Ambulatory Visit: Payer: Medicare (Managed Care) | Attending: Internal Medicine | Primary: Family Medicine
